# Patient Record
Sex: Female | Born: 1950
Health system: Southern US, Community
[De-identification: ages and names within clinical notes are randomized; demographics above are authoritative.]

## PROBLEM LIST (undated history)

## (undated) DIAGNOSIS — K635 Polyp of colon: Secondary | ICD-10-CM

## (undated) DIAGNOSIS — R51 Headache: Secondary | ICD-10-CM

## (undated) DIAGNOSIS — L438 Other lichen planus: Secondary | ICD-10-CM

## (undated) DIAGNOSIS — Z9289 Personal history of other medical treatment: Secondary | ICD-10-CM

## (undated) DIAGNOSIS — D571 Sickle-cell disease without crisis: Secondary | ICD-10-CM

## (undated) DIAGNOSIS — K759 Inflammatory liver disease, unspecified: Secondary | ICD-10-CM

## (undated) DIAGNOSIS — B1920 Unspecified viral hepatitis C without hepatic coma: Secondary | ICD-10-CM

## (undated) DIAGNOSIS — R296 Repeated falls: Secondary | ICD-10-CM

## (undated) DIAGNOSIS — E559 Vitamin D deficiency, unspecified: Secondary | ICD-10-CM

## (undated) DIAGNOSIS — M810 Age-related osteoporosis without current pathological fracture: Secondary | ICD-10-CM

## (undated) DIAGNOSIS — T7840XA Allergy, unspecified, initial encounter: Secondary | ICD-10-CM

## (undated) DIAGNOSIS — S0990XA Unspecified injury of head, initial encounter: Secondary | ICD-10-CM

## (undated) DIAGNOSIS — G44329 Chronic post-traumatic headache, not intractable: Secondary | ICD-10-CM

## (undated) DIAGNOSIS — H269 Unspecified cataract: Secondary | ICD-10-CM

## (undated) DIAGNOSIS — F329 Major depressive disorder, single episode, unspecified: Secondary | ICD-10-CM

## (undated) DIAGNOSIS — T4145XA Adverse effect of unspecified anesthetic, initial encounter: Secondary | ICD-10-CM

## (undated) DIAGNOSIS — K219 Gastro-esophageal reflux disease without esophagitis: Secondary | ICD-10-CM

## (undated) DIAGNOSIS — J45909 Unspecified asthma, uncomplicated: Secondary | ICD-10-CM

## (undated) DIAGNOSIS — Z8489 Family history of other specified conditions: Secondary | ICD-10-CM

## (undated) DIAGNOSIS — R569 Unspecified convulsions: Secondary | ICD-10-CM

## (undated) DIAGNOSIS — F32A Depression, unspecified: Secondary | ICD-10-CM

## (undated) DIAGNOSIS — R519 Headache, unspecified: Secondary | ICD-10-CM

## (undated) DIAGNOSIS — K921 Melena: Secondary | ICD-10-CM

## (undated) DIAGNOSIS — K859 Acute pancreatitis without necrosis or infection, unspecified: Secondary | ICD-10-CM

## (undated) DIAGNOSIS — M1990 Unspecified osteoarthritis, unspecified site: Secondary | ICD-10-CM

## (undated) DIAGNOSIS — F419 Anxiety disorder, unspecified: Secondary | ICD-10-CM

## (undated) DIAGNOSIS — I10 Essential (primary) hypertension: Secondary | ICD-10-CM

## (undated) HISTORY — DX: Age-related osteoporosis without current pathological fracture: M81.0

## (undated) HISTORY — PX: COLONOSCOPY: SHX174

## (undated) HISTORY — DX: Unspecified viral hepatitis C without hepatic coma: B19.20

## (undated) HISTORY — DX: Headache, unspecified: R51.9

## (undated) HISTORY — DX: Other lichen planus: L43.8

## (undated) HISTORY — DX: Polyp of colon: K63.5

## (undated) HISTORY — PX: HERNIA REPAIR: SHX51

## (undated) HISTORY — DX: Depression, unspecified: F32.A

## (undated) HISTORY — DX: Chronic post-traumatic headache, not intractable: G44.329

## (undated) HISTORY — PX: COLON RESECTION: SHX5231

## (undated) HISTORY — PX: OTHER SURGICAL HISTORY: SHX169

## (undated) HISTORY — DX: Unspecified cataract: H26.9

## (undated) HISTORY — DX: Major depressive disorder, single episode, unspecified: F32.9

## (undated) HISTORY — DX: Allergy, unspecified, initial encounter: T78.40XA

## (undated) HISTORY — DX: Melena: K92.1

## (undated) HISTORY — DX: Gastro-esophageal reflux disease without esophagitis: K21.9

## (undated) HISTORY — DX: Vitamin D deficiency, unspecified: E55.9

## (undated) HISTORY — DX: Headache: R51

## (undated) HISTORY — DX: Sickle-cell disease without crisis: D57.1

## (undated) HISTORY — PX: APPENDECTOMY: SHX54

## (undated) HISTORY — DX: Acute pancreatitis without necrosis or infection, unspecified: K85.90

## (undated) HISTORY — PX: EXPLORATORY LAPAROTOMY: SUR591

## (undated) HISTORY — PX: BREAST BIOPSY: SHX20

## (undated) HISTORY — PX: SPINE SURGERY: SHX786

## (undated) HISTORY — DX: Anxiety disorder, unspecified: F41.9

## (undated) HISTORY — PX: TONSILLECTOMY: SUR1361

---

## 1977-03-03 HISTORY — PX: ABDOMINAL HYSTERECTOMY: SHX81

## 2009-04-14 ENCOUNTER — Ambulatory Visit: Payer: Self-pay | Admitting: Neurology

## 2009-07-13 ENCOUNTER — Emergency Department: Payer: Self-pay | Admitting: Emergency Medicine

## 2010-09-12 LAB — HM DEXA SCAN

## 2011-07-15 ENCOUNTER — Ambulatory Visit: Payer: Self-pay | Admitting: Internal Medicine

## 2011-11-26 ENCOUNTER — Ambulatory Visit: Payer: Self-pay | Admitting: Otolaryngology

## 2011-12-15 LAB — PULMONARY FUNCTION TEST

## 2011-12-18 ENCOUNTER — Ambulatory Visit: Payer: Self-pay | Admitting: Internal Medicine

## 2013-09-29 ENCOUNTER — Ambulatory Visit: Payer: Self-pay | Admitting: Gastroenterology

## 2013-10-21 ENCOUNTER — Ambulatory Visit: Payer: Self-pay | Admitting: Gastroenterology

## 2014-10-17 ENCOUNTER — Other Ambulatory Visit: Payer: Self-pay | Admitting: Internal Medicine

## 2014-10-20 ENCOUNTER — Other Ambulatory Visit: Payer: Self-pay | Admitting: Internal Medicine

## 2014-10-20 DIAGNOSIS — N6489 Other specified disorders of breast: Secondary | ICD-10-CM

## 2014-10-30 ENCOUNTER — Ambulatory Visit
Admission: RE | Admit: 2014-10-30 | Discharge: 2014-10-30 | Disposition: A | Discharge status: Home / Self Care | Payer: Medicare Other | Source: Ambulatory Visit | Attending: Internal Medicine | Admitting: Internal Medicine

## 2014-10-30 ENCOUNTER — Other Ambulatory Visit: Payer: Self-pay | Admitting: Internal Medicine

## 2014-10-30 DIAGNOSIS — N6489 Other specified disorders of breast: Secondary | ICD-10-CM

## 2015-04-12 LAB — HM PAP SMEAR: HM PAP: NEGATIVE

## 2015-06-13 ENCOUNTER — Other Ambulatory Visit: Payer: Self-pay | Admitting: Internal Medicine

## 2015-06-13 DIAGNOSIS — Z1231 Encounter for screening mammogram for malignant neoplasm of breast: Secondary | ICD-10-CM

## 2015-08-31 ENCOUNTER — Ambulatory Visit: Payer: Medicare Other

## 2016-03-03 DIAGNOSIS — T8859XA Other complications of anesthesia, initial encounter: Secondary | ICD-10-CM

## 2016-03-03 HISTORY — DX: Other complications of anesthesia, initial encounter: T88.59XA

## 2016-03-18 DIAGNOSIS — J4520 Mild intermittent asthma, uncomplicated: Secondary | ICD-10-CM | POA: Diagnosis not present

## 2016-03-18 DIAGNOSIS — E782 Mixed hyperlipidemia: Secondary | ICD-10-CM | POA: Diagnosis not present

## 2016-03-18 DIAGNOSIS — I10 Essential (primary) hypertension: Secondary | ICD-10-CM | POA: Diagnosis not present

## 2016-03-18 DIAGNOSIS — G44329 Chronic post-traumatic headache, not intractable: Secondary | ICD-10-CM | POA: Diagnosis not present

## 2016-04-18 DIAGNOSIS — I10 Essential (primary) hypertension: Secondary | ICD-10-CM | POA: Diagnosis not present

## 2016-04-18 DIAGNOSIS — M545 Low back pain: Secondary | ICD-10-CM | POA: Diagnosis not present

## 2016-04-18 DIAGNOSIS — R42 Dizziness and giddiness: Secondary | ICD-10-CM | POA: Diagnosis not present

## 2016-04-21 DIAGNOSIS — M5417 Radiculopathy, lumbosacral region: Secondary | ICD-10-CM | POA: Diagnosis not present

## 2016-04-22 ENCOUNTER — Ambulatory Visit: Payer: Medicare Other | Attending: Internal Medicine

## 2016-04-23 DIAGNOSIS — M4686 Other specified inflammatory spondylopathies, lumbar region: Secondary | ICD-10-CM | POA: Diagnosis not present

## 2016-04-23 DIAGNOSIS — M5136 Other intervertebral disc degeneration, lumbar region: Secondary | ICD-10-CM | POA: Diagnosis not present

## 2016-04-23 DIAGNOSIS — M4807 Spinal stenosis, lumbosacral region: Secondary | ICD-10-CM | POA: Diagnosis not present

## 2016-04-23 DIAGNOSIS — M47817 Spondylosis without myelopathy or radiculopathy, lumbosacral region: Secondary | ICD-10-CM | POA: Diagnosis not present

## 2016-04-23 DIAGNOSIS — M48061 Spinal stenosis, lumbar region without neurogenic claudication: Secondary | ICD-10-CM | POA: Diagnosis not present

## 2016-04-24 DIAGNOSIS — F411 Generalized anxiety disorder: Secondary | ICD-10-CM | POA: Diagnosis not present

## 2016-04-24 DIAGNOSIS — I10 Essential (primary) hypertension: Secondary | ICD-10-CM | POA: Diagnosis not present

## 2016-04-24 DIAGNOSIS — M4807 Spinal stenosis, lumbosacral region: Secondary | ICD-10-CM | POA: Diagnosis not present

## 2016-05-05 DIAGNOSIS — M5416 Radiculopathy, lumbar region: Secondary | ICD-10-CM | POA: Diagnosis not present

## 2016-05-08 DIAGNOSIS — M5416 Radiculopathy, lumbar region: Secondary | ICD-10-CM | POA: Diagnosis not present

## 2016-05-08 DIAGNOSIS — M545 Low back pain: Secondary | ICD-10-CM | POA: Diagnosis not present

## 2016-05-08 DIAGNOSIS — M4807 Spinal stenosis, lumbosacral region: Secondary | ICD-10-CM | POA: Diagnosis not present

## 2016-05-14 DIAGNOSIS — M5416 Radiculopathy, lumbar region: Secondary | ICD-10-CM | POA: Diagnosis not present

## 2016-05-14 DIAGNOSIS — M4807 Spinal stenosis, lumbosacral region: Secondary | ICD-10-CM | POA: Diagnosis not present

## 2016-05-14 DIAGNOSIS — M545 Low back pain: Secondary | ICD-10-CM | POA: Diagnosis not present

## 2016-05-15 DIAGNOSIS — M4807 Spinal stenosis, lumbosacral region: Secondary | ICD-10-CM | POA: Diagnosis not present

## 2016-05-15 DIAGNOSIS — M5416 Radiculopathy, lumbar region: Secondary | ICD-10-CM | POA: Diagnosis not present

## 2016-05-15 DIAGNOSIS — M545 Low back pain: Secondary | ICD-10-CM | POA: Diagnosis not present

## 2016-05-20 DIAGNOSIS — M545 Low back pain: Secondary | ICD-10-CM | POA: Diagnosis not present

## 2016-05-20 DIAGNOSIS — M4807 Spinal stenosis, lumbosacral region: Secondary | ICD-10-CM | POA: Diagnosis not present

## 2016-05-20 DIAGNOSIS — M5416 Radiculopathy, lumbar region: Secondary | ICD-10-CM | POA: Diagnosis not present

## 2016-05-27 DIAGNOSIS — M4807 Spinal stenosis, lumbosacral region: Secondary | ICD-10-CM | POA: Diagnosis not present

## 2016-05-27 DIAGNOSIS — M545 Low back pain: Secondary | ICD-10-CM | POA: Diagnosis not present

## 2016-05-27 DIAGNOSIS — M5416 Radiculopathy, lumbar region: Secondary | ICD-10-CM | POA: Diagnosis not present

## 2016-05-29 DIAGNOSIS — M5416 Radiculopathy, lumbar region: Secondary | ICD-10-CM | POA: Diagnosis not present

## 2016-05-29 DIAGNOSIS — M545 Low back pain: Secondary | ICD-10-CM | POA: Diagnosis not present

## 2016-05-29 DIAGNOSIS — M4807 Spinal stenosis, lumbosacral region: Secondary | ICD-10-CM | POA: Diagnosis not present

## 2016-06-10 DIAGNOSIS — M4807 Spinal stenosis, lumbosacral region: Secondary | ICD-10-CM | POA: Diagnosis not present

## 2016-06-11 DIAGNOSIS — M48062 Spinal stenosis, lumbar region with neurogenic claudication: Secondary | ICD-10-CM | POA: Diagnosis not present

## 2016-06-19 DIAGNOSIS — M48061 Spinal stenosis, lumbar region without neurogenic claudication: Secondary | ICD-10-CM | POA: Diagnosis not present

## 2016-06-19 DIAGNOSIS — M48062 Spinal stenosis, lumbar region with neurogenic claudication: Secondary | ICD-10-CM | POA: Diagnosis not present

## 2016-07-04 DIAGNOSIS — M48062 Spinal stenosis, lumbar region with neurogenic claudication: Secondary | ICD-10-CM | POA: Diagnosis not present

## 2016-07-15 ENCOUNTER — Other Ambulatory Visit: Payer: Self-pay | Admitting: Orthopedic Surgery

## 2016-07-15 DIAGNOSIS — M5412 Radiculopathy, cervical region: Secondary | ICD-10-CM

## 2016-07-20 ENCOUNTER — Ambulatory Visit
Admission: RE | Admit: 2016-07-20 | Discharge: 2016-07-20 | Disposition: A | Discharge status: Home / Self Care | Payer: Medicare Other | Source: Ambulatory Visit | Attending: Orthopedic Surgery | Admitting: Orthopedic Surgery

## 2016-07-20 DIAGNOSIS — M4802 Spinal stenosis, cervical region: Secondary | ICD-10-CM | POA: Diagnosis not present

## 2016-07-20 DIAGNOSIS — M5412 Radiculopathy, cervical region: Secondary | ICD-10-CM

## 2016-07-22 DIAGNOSIS — G959 Disease of spinal cord, unspecified: Secondary | ICD-10-CM | POA: Diagnosis not present

## 2016-07-25 ENCOUNTER — Other Ambulatory Visit: Payer: Self-pay | Admitting: Orthopedic Surgery

## 2016-08-01 ENCOUNTER — Ambulatory Visit (HOSPITAL_COMMUNITY)
Admission: RE | Admit: 2016-08-01 | Discharge: 2016-08-01 | Disposition: A | Discharge status: Home / Self Care | Payer: Medicare Other | Source: Ambulatory Visit | Attending: Orthopedic Surgery | Admitting: Orthopedic Surgery

## 2016-08-01 ENCOUNTER — Encounter (HOSPITAL_COMMUNITY)
Admission: RE | Admit: 2016-08-01 | Discharge: 2016-08-01 | Disposition: A | Discharge status: Home / Self Care | Payer: Medicare Other | Source: Ambulatory Visit | Attending: Orthopedic Surgery | Admitting: Orthopedic Surgery

## 2016-08-01 ENCOUNTER — Encounter (HOSPITAL_COMMUNITY): Payer: Self-pay

## 2016-08-01 DIAGNOSIS — Z01812 Encounter for preprocedural laboratory examination: Secondary | ICD-10-CM | POA: Insufficient documentation

## 2016-08-01 DIAGNOSIS — Z01818 Encounter for other preprocedural examination: Secondary | ICD-10-CM | POA: Diagnosis not present

## 2016-08-01 DIAGNOSIS — Z79899 Other long term (current) drug therapy: Secondary | ICD-10-CM | POA: Diagnosis not present

## 2016-08-01 DIAGNOSIS — Z87891 Personal history of nicotine dependence: Secondary | ICD-10-CM | POA: Diagnosis not present

## 2016-08-01 DIAGNOSIS — G959 Disease of spinal cord, unspecified: Secondary | ICD-10-CM | POA: Insufficient documentation

## 2016-08-01 DIAGNOSIS — R569 Unspecified convulsions: Secondary | ICD-10-CM | POA: Insufficient documentation

## 2016-08-01 HISTORY — DX: Personal history of other medical treatment: Z92.89

## 2016-08-01 HISTORY — DX: Gastro-esophageal reflux disease without esophagitis: K21.9

## 2016-08-01 HISTORY — DX: Unspecified asthma, uncomplicated: J45.909

## 2016-08-01 HISTORY — DX: Unspecified injury of head, initial encounter: S09.90XA

## 2016-08-01 HISTORY — DX: Family history of other specified conditions: Z84.89

## 2016-08-01 HISTORY — DX: Essential (primary) hypertension: I10

## 2016-08-01 HISTORY — DX: Unspecified osteoarthritis, unspecified site: M19.90

## 2016-08-01 HISTORY — DX: Unspecified convulsions: R56.9

## 2016-08-01 LAB — CBC WITH DIFFERENTIAL/PLATELET
Basophils Absolute: 0 10*3/uL (ref 0.0–0.1)
Basophils Relative: 1 %
EOS PCT: 5 %
Eosinophils Absolute: 0.3 10*3/uL (ref 0.0–0.7)
HEMATOCRIT: 37.1 % (ref 36.0–46.0)
HEMOGLOBIN: 12.2 g/dL (ref 12.0–15.0)
LYMPHS ABS: 1.9 10*3/uL (ref 0.7–4.0)
LYMPHS PCT: 32 %
MCH: 28.8 pg (ref 26.0–34.0)
MCHC: 32.9 g/dL (ref 30.0–36.0)
MCV: 87.5 fL (ref 78.0–100.0)
Monocytes Absolute: 0.5 10*3/uL (ref 0.1–1.0)
Monocytes Relative: 8 %
NEUTROS ABS: 3.1 10*3/uL (ref 1.7–7.7)
NEUTROS PCT: 54 %
Platelets: 186 10*3/uL (ref 150–400)
RBC: 4.24 MIL/uL (ref 3.87–5.11)
RDW: 12.3 % (ref 11.5–15.5)
WBC: 5.7 10*3/uL (ref 4.0–10.5)

## 2016-08-01 LAB — COMPREHENSIVE METABOLIC PANEL
ALT: 39 U/L (ref 14–54)
AST: 45 U/L — ABNORMAL HIGH (ref 15–41)
Albumin: 3.7 g/dL (ref 3.5–5.0)
Alkaline Phosphatase: 78 U/L (ref 38–126)
Anion gap: 7 (ref 5–15)
BILIRUBIN TOTAL: 0.9 mg/dL (ref 0.3–1.2)
BUN: 8 mg/dL (ref 6–20)
CALCIUM: 8.9 mg/dL (ref 8.9–10.3)
CO2: 28 mmol/L (ref 22–32)
CREATININE: 0.87 mg/dL (ref 0.44–1.00)
Chloride: 106 mmol/L (ref 101–111)
Glucose, Bld: 76 mg/dL (ref 65–99)
Potassium: 4.4 mmol/L (ref 3.5–5.1)
Sodium: 141 mmol/L (ref 135–145)
Total Protein: 6.4 g/dL — ABNORMAL LOW (ref 6.5–8.1)

## 2016-08-01 LAB — PROTIME-INR
INR: 1.01
PROTHROMBIN TIME: 13.3 s (ref 11.4–15.2)

## 2016-08-01 LAB — URINALYSIS, ROUTINE W REFLEX MICROSCOPIC
Bilirubin Urine: NEGATIVE
GLUCOSE, UA: NEGATIVE mg/dL
Hgb urine dipstick: NEGATIVE
KETONES UR: NEGATIVE mg/dL
Leukocytes, UA: NEGATIVE
Nitrite: NEGATIVE
PH: 7 (ref 5.0–8.0)
PROTEIN: NEGATIVE mg/dL
Specific Gravity, Urine: 1.009 (ref 1.005–1.030)

## 2016-08-01 LAB — SURGICAL PCR SCREEN
MRSA, PCR: NEGATIVE
Staphylococcus aureus: NEGATIVE

## 2016-08-01 LAB — APTT: aPTT: 33 seconds (ref 24–36)

## 2016-08-01 NOTE — Pre-Procedure Instructions (Signed)
    Amy Grant  08/01/2016    Your procedure is scheduled on Thursday, June 7.  [facility]:00 AM - per Dr Marshell Levanumonski's instructions               Your surgery or procedure is scheduled for 0:00 AM   Call this number if you have problems the morning of [phone]                For any other questions, please call [phone], Monday - Friday 8 AM - 4 PM.     Remember:  Do not eat food or drink liquids after midnight Wednesday, June 6.  Take these medicines the morning of surgery with A SIP OF WATER:gabapentin (NEURONTIN),  lamoTRIgine (LAMICTAL), pantoprazole (PROTONIX).  May use Inhalers and Bring it to the hospital with you.                    May take if needed and you tolerate without food: oxyCODONE-acetaminophen (PERCOCET/ROXICET), cyclobenzaprine (FLEXERIL).                  1 Week prior to surgery STOP taking Aspirin, Aspirin Products (Goody Powder, Excedrin Migraine), Ibuprofen (Advil), Naproxen (Aleve), meloxicam (MOBIC) Vitamins and Herbal Products (ie Fish Oil).   Do not wear jewelry, make-up or nail polish.  Do not wear lotions, powders, or perfumes, or deodorant.  Do not shave 48 hours prior to surgery.  Men may shave face and neck.  Do not bring valuables to the hospital.  [facility] not [facility] any belongings or valuables.  Contacts, dentures or bridgework may not be worn into surgery.  Leave your suitcase in the car.  After surgery it may be brought to your room.  For patients admitted to the hospital, discharge time will be determined by your treatment team.  Special instructions: Review  [facility] - Preparing For Surgery.  Please read over the following fact sheets that you were given. [facility]- Preparing For Surgery and Patient Instructions for Mupirocin Application, Pain Booklet, Surgical Site Infections

## 2016-08-01 NOTE — Progress Notes (Signed)
Ms Amy Grant denies chest pain or shortness of breath.  Patient was seen by Dr Santo HeldS.Kahn the cardiologist recently to rule out cardiac issues causing weakness that she has been experiencing.  Cardiac issues were ruled out.  Patients PCP is Dr Welton FlakesKhan. I requestted records by both Dr Milta DeitersKhan's offices.

## 2016-08-04 ENCOUNTER — Encounter (HOSPITAL_COMMUNITY): Payer: Self-pay

## 2016-08-04 ENCOUNTER — Other Ambulatory Visit (HOSPITAL_COMMUNITY): Payer: Medicare Other

## 2016-08-04 NOTE — Progress Notes (Signed)
Anesthesia Chart Review:  Pt is a 66 year old female scheduled for C4-5 ACDF on 08/07/2016 with Estill BambergMark Dumonski, MD  - No PCP listed. - Saw cardiologist Adrian BlackwaterShaukat Khan, MD for chest pain 11/2015. Echo and stress test ordered, results below. F/u prn recommended.   PMH includes:  Seizures. Former smoker. BMI 22.5  Medications include: Lamictal, Protonix, albuterol, verapamil  Preoperative labs reviewed.    CXR 61/18: No active cardiopulmonary disease.   EKG 11/16/15: NSR.  Nuclear stress test 12/11/15 (Dr. Milta DeitersKhan's office): Normal stress test with normal LVEF 67%.  Echo 12/03/15 (Dr. Milta DeitersKhan's office): 1. Suboptimal apical views due to poor windows. 2. Mildly dilated LA with LV, RA and RV, and aorta appearing normal in size. 3. Normal LV systolic function with grade I diastolic dysfunction 4. Normal wall motion. 5. Mild pulmonary regurgitation (2 jets) 6. Mild tricuspid regurgitation. 7. Normal pulmonary artery pressure. 8. Mild to moderate mitral regurgitation.  If no changes, I anticipate pt can proceed with surgery as scheduled.   Rica Mastngela Rontavious Albright, FNP-BC [facility]/Anesthesiology Phone: [phone] 08/04/2016 2:44 PM

## 2016-08-06 NOTE — H&P (Signed)
PREOPERATIVE H&P  Chief Complaint: Balance instability  HPI: Amy Grant is a 66 y.o. female who presents with progressive balance difficulties  MRI reveals SCC at C4/5  Patient has failed multiple forms of conservative care and continues to have pain (see office notes for additional details regarding the patient's full course of treatment)  Past Medical History:  Diagnosis Date  . Arthritis    right hand, DDD L 4 , L 5  . Asthma   . Family history of adverse reaction to anesthesia    Sister woke up during surgery  . GERD (gastroesophageal reflux disease)   . Head injury   . History of blood transfusion   . Hypertension   . Seizures (HCC)     after head injury   Past Surgical History:  Procedure Laterality Date  . ABDOMINAL HYSTERECTOMY      a ovarary left  . APPENDECTOMY    . BREAST BIOPSY Left    over 20 years ago negative  . CESAREAN SECTION    . COLON RESECTION    . COLONOSCOPY    . EXPLORATORY LAPAROTOMY     after C Section  x 2 - 1 for bleeding and 1 for infection  . HERNIA REPAIR     Inguinal - as a child  . TONSILLECTOMY     Social History   Social History  . Marital status: Divorced    Spouse name: N/A  . Number of children: N/A  . Years of education: N/A   Social History Main Topics  . Smoking status: Former Smoker    Years: 30.00  . Smokeless tobacco: Never Used  . Alcohol use Yes     Comment: ocassional glass of wine  . Drug use: No  . Sexual activity: Not on file   Other Topics Concern  . Not on file   Social History Narrative  . No narrative on file   Family History  Problem Relation Age of Onset  . Breast cancer Sister 8065   Allergies  Allergen Reactions  . Linaclotide Itching  . Latex Hives  . Sulfa Antibiotics Hives   Prior to Admission medications   Medication Sig Start Date End Date Taking? Authorizing Provider  Calcium Carbonate-Vitamin D (CALTRATE 600+D PO) Take 2 tablets by mouth daily.   Yes [provider]  cyclobenzaprine (FLEXERIL) 10 MG tablet Take 10 mg by mouth daily as needed for muscle spasms.   Yes [provider]  gabapentin (NEURONTIN) 300 MG capsule Take 300 mg by mouth 2 (two) times daily.   Yes [provider]  lamoTRIgine (LAMICTAL) 100 MG tablet Take 100 mg by mouth daily.   Yes [provider]  meloxicam (MOBIC) 15 MG tablet Take 15 mg by mouth every evening.   Yes [provider]  Multiple Vitamin (MULTIVITAMIN WITH MINERALS) TABS tablet Take 1 tablet by mouth daily.   Yes [provider]  Multiple Vitamins-Minerals (HAIR/SKIN/NAILS) TABS Take 1 tablet by mouth daily.   Yes [provider]  Omega-3 Fatty Acids (OMEGA 3 PO) Take 1 capsule by mouth 2 (two) times daily. 1470 mg Fish Oil   Yes [provider]  oxyCODONE-acetaminophen (PERCOCET/ROXICET) 5-325 MG tablet Take 1 tablet by mouth 2 (two) times daily as needed for moderate pain or severe pain.   Yes [provider]  pantoprazole (PROTONIX) 40 MG tablet Take 40 mg by mouth 2 (two) times daily.   Yes [provider]  PREVIDENT  5000 SENSITIVE 1.1-5 % PSTE Take 1 application by mouth 2 (two) times daily. 06/30/16  Yes [provider]  UNABLE TO FIND Take 1 tablet by mouth daily. Melaleuca Unforgettables Memory supplement Phosphatidylserine Omega # Ginkgo Biloba Antioxidants   Yes [provider]  UNABLE TO FIND Take 1 tablet by mouth 2 (two) times daily. Melaleuca Estraval Estrogen Supplement   Yes [provider]  UNABLE TO FIND Take 1 capsule by mouth daily. Melaleuca Replenex Bone Health   Yes [provider]  UNABLE TO FIND Take 1 tablet by mouth daily. Melaleuca Acuity [place] : Memory Supplement 1000 mcg B12 200 mg Vitamin E  800 mcg Folic Acid   Yes [provider]  VENTOLIN HFA 108 (90 Base) MCG/ACT inhaler Inhale 2 puffs into the lungs every 4 (four) hours as needed for shortness of  breath (takes 2 times daily every day).  07/01/16  Yes [provider]  verapamil (CALAN-SR) 240 MG CR tablet Take 240 mg by mouth at bedtime.    Yes [provider]     All other systems have been reviewed and were otherwise negative with the exception of those mentioned in the HPI and as above.  Physical Exam: There were no vitals filed for this visit.  General: Alert, no acute distress Cardiovascular: No pedal edema Respiratory: No cyanosis, no use of accessory musculature Skin: No lesions in the area of chief complaint Neurologic: Sensation intact distally Psychiatric: Patient is competent for consent with normal mood and affect Lymphatic: No axillary or cervical lymphadenopathy  MUSCULOSKELETAL: + hoffman's sign bilaterally  Assessment/Plan: Cervical myelopathy  Plan for Procedure(s): ANTERIOR CERVICAL DECOMPRESSION FUSION, CERVICAL 4-5 WITH INSTRUMENTATION AND ALLOGRAFT   Emilee Hero, MD 08/06/2016 2:18 PM

## 2016-08-07 ENCOUNTER — Encounter (HOSPITAL_COMMUNITY)
Admission: RE | Disposition: A | Discharge status: Home / Self Care | Payer: Self-pay | Source: Ambulatory Visit | Attending: Orthopedic Surgery

## 2016-08-07 ENCOUNTER — Ambulatory Visit (HOSPITAL_COMMUNITY): Payer: Medicare Other | Admitting: Emergency Medicine

## 2016-08-07 ENCOUNTER — Inpatient Hospital Stay (HOSPITAL_COMMUNITY)
Admission: RE | Admit: 2016-08-07 | Discharge: 2016-08-08 | DRG: 030 | Disposition: A | Discharge status: Home / Self Care | Payer: Medicare Other | Source: Ambulatory Visit | Attending: Orthopedic Surgery | Admitting: Orthopedic Surgery

## 2016-08-07 ENCOUNTER — Encounter (HOSPITAL_COMMUNITY): Payer: Self-pay | Admitting: General Practice

## 2016-08-07 ENCOUNTER — Ambulatory Visit (HOSPITAL_COMMUNITY): Payer: Medicare Other | Admitting: Certified Registered"

## 2016-08-07 ENCOUNTER — Ambulatory Visit (HOSPITAL_COMMUNITY): Payer: Medicare Other

## 2016-08-07 DIAGNOSIS — M40209 Unspecified kyphosis, site unspecified: Secondary | ICD-10-CM | POA: Diagnosis present

## 2016-08-07 DIAGNOSIS — Z882 Allergy status to sulfonamides status: Secondary | ICD-10-CM

## 2016-08-07 DIAGNOSIS — Z888 Allergy status to other drugs, medicaments and biological substances status: Secondary | ICD-10-CM | POA: Diagnosis not present

## 2016-08-07 DIAGNOSIS — G40909 Epilepsy, unspecified, not intractable, without status epilepticus: Secondary | ICD-10-CM | POA: Diagnosis present

## 2016-08-07 DIAGNOSIS — G9589 Other specified diseases of spinal cord: Principal | ICD-10-CM | POA: Diagnosis present

## 2016-08-07 DIAGNOSIS — M50221 Other cervical disc displacement at C4-C5 level: Secondary | ICD-10-CM | POA: Diagnosis present

## 2016-08-07 DIAGNOSIS — K219 Gastro-esophageal reflux disease without esophagitis: Secondary | ICD-10-CM | POA: Diagnosis present

## 2016-08-07 DIAGNOSIS — G959 Disease of spinal cord, unspecified: Secondary | ICD-10-CM | POA: Diagnosis not present

## 2016-08-07 DIAGNOSIS — Z91040 Latex allergy status: Secondary | ICD-10-CM

## 2016-08-07 DIAGNOSIS — M5410 Radiculopathy, site unspecified: Secondary | ICD-10-CM | POA: Diagnosis present

## 2016-08-07 DIAGNOSIS — M5416 Radiculopathy, lumbar region: Secondary | ICD-10-CM | POA: Diagnosis present

## 2016-08-07 DIAGNOSIS — Z87891 Personal history of nicotine dependence: Secondary | ICD-10-CM | POA: Diagnosis not present

## 2016-08-07 DIAGNOSIS — Z79899 Other long term (current) drug therapy: Secondary | ICD-10-CM

## 2016-08-07 DIAGNOSIS — Z419 Encounter for procedure for purposes other than remedying health state, unspecified: Secondary | ICD-10-CM

## 2016-08-07 DIAGNOSIS — R296 Repeated falls: Secondary | ICD-10-CM | POA: Diagnosis present

## 2016-08-07 DIAGNOSIS — M40292 Other kyphosis, cervical region: Secondary | ICD-10-CM | POA: Diagnosis not present

## 2016-08-07 DIAGNOSIS — I10 Essential (primary) hypertension: Secondary | ICD-10-CM | POA: Diagnosis present

## 2016-08-07 DIAGNOSIS — M4322 Fusion of spine, cervical region: Secondary | ICD-10-CM | POA: Diagnosis not present

## 2016-08-07 DIAGNOSIS — G8929 Other chronic pain: Secondary | ICD-10-CM | POA: Diagnosis present

## 2016-08-07 DIAGNOSIS — Z9889 Other specified postprocedural states: Secondary | ICD-10-CM | POA: Diagnosis not present

## 2016-08-07 HISTORY — DX: Repeated falls: R29.6

## 2016-08-07 HISTORY — PX: ANTERIOR CERVICAL DECOMP/DISCECTOMY FUSION: SHX1161

## 2016-08-07 HISTORY — DX: Other chronic pain: G89.29

## 2016-08-07 SURGERY — ANTERIOR CERVICAL DECOMPRESSION/DISCECTOMY FUSION 1 LEVEL
Anesthesia: General | Site: Neck

## 2016-08-07 MED ORDER — CALCIUM CARBONATE-VITAMIN D 500-200 MG-UNIT PO TABS
ORAL_TABLET | Freq: Every day | ORAL | Status: DC
  Administered 2016-08-08: 1 via ORAL
  Filled 2016-08-07: qty 1

## 2016-08-07 MED ORDER — THROMBIN 20000 UNITS EX SOLR
CUTANEOUS | Status: AC
  Filled 2016-08-07: qty 20000

## 2016-08-07 MED ORDER — FENTANYL CITRATE (PF) 250 MCG/5ML IJ SOLN
INTRAMUSCULAR | Status: AC
  Filled 2016-08-07: qty 5

## 2016-08-07 MED ORDER — VERAPAMIL HCL ER 240 MG PO TBCR
240.0000 mg | EXTENDED_RELEASE_TABLET | Freq: Every day | ORAL | Status: DC
  Administered 2016-08-07: 240 mg via ORAL
  Filled 2016-08-07: qty 1

## 2016-08-07 MED ORDER — DEXAMETHASONE SODIUM PHOSPHATE 10 MG/ML IJ SOLN
INTRAMUSCULAR | Status: AC
  Filled 2016-08-07: qty 1

## 2016-08-07 MED ORDER — BUPIVACAINE-EPINEPHRINE 0.25% -1:200000 IJ SOLN
INTRAMUSCULAR | Status: DC | PRN
  Administered 2016-08-07: 3 mL

## 2016-08-07 MED ORDER — CEFAZOLIN SODIUM-DEXTROSE 2-4 GM/100ML-% IV SOLN
2.0000 g | Freq: Three times a day (TID) | INTRAVENOUS | Status: AC
  Administered 2016-08-07 – 2016-08-08 (×2): 2 g via INTRAVENOUS
  Filled 2016-08-07 (×2): qty 100

## 2016-08-07 MED ORDER — ADULT MULTIVITAMIN W/MINERALS CH
1.0000 | ORAL_TABLET | Freq: Every day | ORAL | Status: DC
  Administered 2016-08-08: 1 via ORAL
  Filled 2016-08-07: qty 1

## 2016-08-07 MED ORDER — DOCUSATE SODIUM 100 MG PO CAPS
100.0000 mg | ORAL_CAPSULE | Freq: Two times a day (BID) | ORAL | Status: DC
  Administered 2016-08-07 – 2016-08-08 (×2): 100 mg via ORAL
  Filled 2016-08-07 (×2): qty 1

## 2016-08-07 MED ORDER — EPINEPHRINE PF 1 MG/ML IJ SOLN
INTRAMUSCULAR | Status: AC
  Filled 2016-08-07: qty 1

## 2016-08-07 MED ORDER — SENNOSIDES-DOCUSATE SODIUM 8.6-50 MG PO TABS: 1.0000 | ORAL_TABLET | Freq: Every evening | ORAL | Status: DC | PRN

## 2016-08-07 MED ORDER — OXYCODONE HCL 5 MG PO TABS: 5.0000 mg | ORAL_TABLET | Freq: Once | ORAL | Status: DC | PRN

## 2016-08-07 MED ORDER — ROCURONIUM BROMIDE 10 MG/ML (PF) SYRINGE
PREFILLED_SYRINGE | INTRAVENOUS | Status: AC
  Filled 2016-08-07: qty 5

## 2016-08-07 MED ORDER — ACETAMINOPHEN 325 MG PO TABS: 650.0000 mg | ORAL_TABLET | ORAL | Status: DC | PRN

## 2016-08-07 MED ORDER — PHENYLEPHRINE HCL 10 MG/ML IJ SOLN
INTRAVENOUS | Status: DC | PRN
  Administered 2016-08-07: 30 ug/min via INTRAVENOUS

## 2016-08-07 MED ORDER — 0.9 % SODIUM CHLORIDE (POUR BTL) OPTIME
TOPICAL | Status: DC | PRN
  Administered 2016-08-07: 1000 mL

## 2016-08-07 MED ORDER — ACETAMINOPHEN 650 MG RE SUPP: 650.0000 mg | RECTAL | Status: DC | PRN

## 2016-08-07 MED ORDER — SUGAMMADEX SODIUM 200 MG/2ML IV SOLN
INTRAVENOUS | Status: DC | PRN
  Administered 2016-08-07: 130 mg via INTRAVENOUS

## 2016-08-07 MED ORDER — POVIDONE-IODINE 7.5 % EX SOLN
Freq: Once | CUTANEOUS | Status: DC
  Filled 2016-08-07: qty 118

## 2016-08-07 MED ORDER — BUPIVACAINE HCL (PF) 0.25 % IJ SOLN
INTRAMUSCULAR | Status: AC
  Filled 2016-08-07: qty 30

## 2016-08-07 MED ORDER — MIDAZOLAM HCL 5 MG/5ML IJ SOLN
INTRAMUSCULAR | Status: DC | PRN
  Administered 2016-08-07: 2 mg via INTRAVENOUS

## 2016-08-07 MED ORDER — THROMBIN 20000 UNITS EX SOLR
CUTANEOUS | Status: DC | PRN
  Administered 2016-08-07: 20 mL via TOPICAL

## 2016-08-07 MED ORDER — LACTATED RINGERS IV SOLN
INTRAVENOUS | Status: DC
  Administered 2016-08-07 (×2): via INTRAVENOUS

## 2016-08-07 MED ORDER — PROPOFOL 10 MG/ML IV BOLUS
INTRAVENOUS | Status: AC
  Filled 2016-08-07: qty 20

## 2016-08-07 MED ORDER — DIAZEPAM 5 MG PO TABS
5.0000 mg | ORAL_TABLET | Freq: Four times a day (QID) | ORAL | Status: DC | PRN
  Administered 2016-08-07: 5 mg via ORAL
  Filled 2016-08-07: qty 1

## 2016-08-07 MED ORDER — BISACODYL 5 MG PO TBEC: 5.0000 mg | DELAYED_RELEASE_TABLET | Freq: Every day | ORAL | Status: DC | PRN

## 2016-08-07 MED ORDER — ONDANSETRON HCL 4 MG PO TABS: 4.0000 mg | ORAL_TABLET | Freq: Four times a day (QID) | ORAL | Status: DC | PRN

## 2016-08-07 MED ORDER — MORPHINE SULFATE (PF) 4 MG/ML IV SOLN
1.0000 mg | INTRAVENOUS | Status: DC | PRN
  Administered 2016-08-07: 2 mg via INTRAVENOUS
  Filled 2016-08-07: qty 1

## 2016-08-07 MED ORDER — FLEET ENEMA 7-19 GM/118ML RE ENEM: 1.0000 | ENEMA | Freq: Once | RECTAL | Status: DC | PRN

## 2016-08-07 MED ORDER — ZOLPIDEM TARTRATE 5 MG PO TABS: 5.0000 mg | ORAL_TABLET | Freq: Every evening | ORAL | Status: DC | PRN

## 2016-08-07 MED ORDER — ALBUTEROL SULFATE (2.5 MG/3ML) 0.083% IN NEBU: 3.0000 mL | INHALATION_SOLUTION | RESPIRATORY_TRACT | Status: DC | PRN

## 2016-08-07 MED ORDER — CEFAZOLIN SODIUM-DEXTROSE 2-4 GM/100ML-% IV SOLN
2.0000 g | INTRAVENOUS | Status: AC
  Administered 2016-08-07: 2 g via INTRAVENOUS
  Filled 2016-08-07: qty 100

## 2016-08-07 MED ORDER — BUPIVACAINE-EPINEPHRINE (PF) 0.5% -1:200000 IJ SOLN
INTRAMUSCULAR | Status: AC
  Filled 2016-08-07: qty 30

## 2016-08-07 MED ORDER — LAMOTRIGINE 100 MG PO TABS
100.0000 mg | ORAL_TABLET | Freq: Every day | ORAL | Status: DC
  Administered 2016-08-08: 100 mg via ORAL
  Filled 2016-08-07: qty 1

## 2016-08-07 MED ORDER — LIDOCAINE 2% (20 MG/ML) 5 ML SYRINGE
INTRAMUSCULAR | Status: AC
  Filled 2016-08-07: qty 5

## 2016-08-07 MED ORDER — OXYCODONE HCL 5 MG/5ML PO SOLN: 5.0000 mg | Freq: Once | ORAL | Status: DC | PRN

## 2016-08-07 MED ORDER — ALUM & MAG HYDROXIDE-SIMETH 200-200-20 MG/5ML PO SUSP: 30.0000 mL | Freq: Four times a day (QID) | ORAL | Status: DC | PRN

## 2016-08-07 MED ORDER — FENTANYL CITRATE (PF) 100 MCG/2ML IJ SOLN
INTRAMUSCULAR | Status: DC | PRN
  Administered 2016-08-07: 100 ug via INTRAVENOUS
  Administered 2016-08-07 (×4): 50 ug via INTRAVENOUS

## 2016-08-07 MED ORDER — MIDAZOLAM HCL 2 MG/2ML IJ SOLN
INTRAMUSCULAR | Status: AC
  Filled 2016-08-07: qty 2

## 2016-08-07 MED ORDER — SODIUM CHLORIDE 0.9% FLUSH
3.0000 mL | Freq: Two times a day (BID) | INTRAVENOUS | Status: DC
  Administered 2016-08-07: 3 mL via INTRAVENOUS

## 2016-08-07 MED ORDER — SOD FLUORIDE-POTASSIUM NITRATE 1.1-5 % DT PSTE: 1.0000 "application " | PASTE | Freq: Two times a day (BID) | DENTAL | Status: DC

## 2016-08-07 MED ORDER — OXYCODONE-ACETAMINOPHEN 5-325 MG PO TABS
1.0000 | ORAL_TABLET | ORAL | Status: DC | PRN
  Administered 2016-08-07 – 2016-08-08 (×4): 2 via ORAL
  Filled 2016-08-07 (×4): qty 2

## 2016-08-07 MED ORDER — ONDANSETRON HCL 4 MG/2ML IJ SOLN
INTRAMUSCULAR | Status: AC
  Filled 2016-08-07: qty 2

## 2016-08-07 MED ORDER — PANTOPRAZOLE SODIUM 40 MG PO TBEC
40.0000 mg | DELAYED_RELEASE_TABLET | Freq: Two times a day (BID) | ORAL | Status: DC
  Administered 2016-08-07 – 2016-08-08 (×2): 40 mg via ORAL
  Filled 2016-08-07 (×3): qty 1

## 2016-08-07 MED ORDER — GABAPENTIN 300 MG PO CAPS
300.0000 mg | ORAL_CAPSULE | Freq: Two times a day (BID) | ORAL | Status: DC
  Administered 2016-08-07 – 2016-08-08 (×2): 300 mg via ORAL
  Filled 2016-08-07 (×2): qty 1

## 2016-08-07 MED ORDER — ONDANSETRON HCL 4 MG/2ML IJ SOLN: 4.0000 mg | Freq: Four times a day (QID) | INTRAMUSCULAR | Status: DC | PRN

## 2016-08-07 MED ORDER — ONDANSETRON HCL 4 MG/2ML IJ SOLN
INTRAMUSCULAR | Status: DC | PRN
  Administered 2016-08-07: 4 mg via INTRAVENOUS

## 2016-08-07 MED ORDER — PHENOL 1.4 % MT LIQD
1.0000 | OROMUCOSAL | Status: DC | PRN
  Administered 2016-08-07: 1 via OROMUCOSAL
  Filled 2016-08-07: qty 177

## 2016-08-07 MED ORDER — ROCURONIUM BROMIDE 100 MG/10ML IV SOLN
INTRAVENOUS | Status: DC | PRN
  Administered 2016-08-07: 60 mg via INTRAVENOUS

## 2016-08-07 MED ORDER — HYDROMORPHONE HCL 1 MG/ML IJ SOLN: 0.2500 mg | INTRAMUSCULAR | Status: DC | PRN

## 2016-08-07 MED ORDER — DEXAMETHASONE SODIUM PHOSPHATE 10 MG/ML IJ SOLN
INTRAMUSCULAR | Status: DC | PRN
  Administered 2016-08-07: 10 mg via INTRAVENOUS

## 2016-08-07 MED ORDER — MENTHOL 3 MG MT LOZG: 1.0000 | LOZENGE | OROMUCOSAL | Status: DC | PRN

## 2016-08-07 MED ORDER — SODIUM CHLORIDE 0.9% FLUSH: 3.0000 mL | INTRAVENOUS | Status: DC | PRN

## 2016-08-07 MED ORDER — SUGAMMADEX SODIUM 200 MG/2ML IV SOLN
INTRAVENOUS | Status: AC
  Filled 2016-08-07: qty 2

## 2016-08-07 MED ORDER — PROPOFOL 10 MG/ML IV BOLUS
INTRAVENOUS | Status: DC | PRN
  Administered 2016-08-07: 20 mg via INTRAVENOUS
  Administered 2016-08-07: 140 mg via INTRAVENOUS

## 2016-08-07 MED ORDER — BUPIVACAINE LIPOSOME 1.3 % IJ SUSP
20.0000 mL | INTRAMUSCULAR | Status: DC
Start: 2016-08-07 — End: 2016-08-07
  Filled 2016-08-07: qty 20

## 2016-08-07 MED ORDER — PANTOPRAZOLE SODIUM 40 MG IV SOLR: 40.0000 mg | Freq: Every day | INTRAVENOUS | Status: DC

## 2016-08-07 SURGICAL SUPPLY — 74 items
BENZOIN TINCTURE PRP APPL 2/3 (GAUZE/BANDAGES/DRESSINGS) ×3 IMPLANT
BIT DRILL NEURO 2X3.1 SFT TUCH (MISCELLANEOUS) ×1 IMPLANT
BIT DRILL SRG 14X2.2XFLT CHK (BIT) ×1 IMPLANT
BIT DRL SRG 14X2.2XFLT CHK (BIT) ×1
BLADE CLIPPER SURG (BLADE) IMPLANT
BLADE SURG 15 STRL LF DISP TIS (BLADE) ×1 IMPLANT
BLADE SURG 15 STRL SS (BLADE) ×2
BONE VIVIGEN FORMABLE 1.3CC (Bone Implant) ×3 IMPLANT
BUR MATCHSTICK NEURO 3.0 LAGG (BURR) IMPLANT
CARTRIDGE OIL MAESTRO DRILL (MISCELLANEOUS) ×1 IMPLANT
CLOSURE WOUND 1/2 X4 (GAUZE/BANDAGES/DRESSINGS) ×1
CORDS BIPOLAR (ELECTRODE) ×3 IMPLANT
COVER SURGICAL LIGHT HANDLE (MISCELLANEOUS) ×3 IMPLANT
CRADLE DONUT ADULT HEAD (MISCELLANEOUS) ×3 IMPLANT
DIFFUSER DRILL AIR PNEUMATIC (MISCELLANEOUS) ×3 IMPLANT
DRAIN JACKSON RD 7FR 3/32 (WOUND CARE) IMPLANT
DRAPE C-ARM 42X72 X-RAY (DRAPES) ×3 IMPLANT
DRAPE POUCH INSTRU U-SHP 10X18 (DRAPES) ×3 IMPLANT
DRAPE SURG 17X23 STRL (DRAPES) ×9 IMPLANT
DRILL BIT SKYLINE 14MM (BIT) ×2
DRILL NEURO 2X3.1 SOFT TOUCH (MISCELLANEOUS) ×3
DURAPREP 26ML APPLICATOR (WOUND CARE) ×3 IMPLANT
ELECT COATED BLADE 2.86 ST (ELECTRODE) ×3 IMPLANT
ELECT REM PT RETURN 9FT ADLT (ELECTROSURGICAL) ×3
ELECTRODE REM PT RTRN 9FT ADLT (ELECTROSURGICAL) ×1 IMPLANT
EVACUATOR SILICONE 100CC (DRAIN) IMPLANT
GAUZE SPONGE 4X4 12PLY STRL (GAUZE/BANDAGES/DRESSINGS) ×3 IMPLANT
GAUZE SPONGE 4X4 12PLY STRL LF (GAUZE/BANDAGES/DRESSINGS) ×3 IMPLANT
GAUZE SPONGE 4X4 16PLY XRAY LF (GAUZE/BANDAGES/DRESSINGS) ×3 IMPLANT
GLOVE BIO SURGEON STRL SZ7 (GLOVE) ×3 IMPLANT
GLOVE BIO SURGEON STRL SZ8 (GLOVE) ×3 IMPLANT
GLOVE BIOGEL PI IND STRL 7.0 (GLOVE) ×2 IMPLANT
GLOVE BIOGEL PI IND STRL 8 (GLOVE) ×1 IMPLANT
GLOVE BIOGEL PI INDICATOR 7.0 (GLOVE) ×4
GLOVE BIOGEL PI INDICATOR 8 (GLOVE) ×2
GOWN STRL REUS W/ TWL LRG LVL3 (GOWN DISPOSABLE) ×1 IMPLANT
GOWN STRL REUS W/ TWL XL LVL3 (GOWN DISPOSABLE) ×1 IMPLANT
GOWN STRL REUS W/TWL LRG LVL3 (GOWN DISPOSABLE) ×2
GOWN STRL REUS W/TWL XL LVL3 (GOWN DISPOSABLE) ×2
INTERLOCK LRDTC CRVCL VBR 7MM (Bone Implant) ×1 IMPLANT
IV CATH 14GX2 1/4 (CATHETERS) ×3 IMPLANT
KIT BASIN OR (CUSTOM PROCEDURE TRAY) ×3 IMPLANT
KIT ROOM TURNOVER OR (KITS) ×3 IMPLANT
LORDOTIC CERVICAL VBR 7MM SM (Bone Implant) ×3 IMPLANT
MANIFOLD NEPTUNE II (INSTRUMENTS) ×3 IMPLANT
MARKER SKIN DUAL TIP RULER LAB (MISCELLANEOUS) ×3 IMPLANT
NEEDLE PRECISIONGLIDE 27X1.5 (NEEDLE) ×3 IMPLANT
NEEDLE SPNL 20GX3.5 QUINCKE YW (NEEDLE) ×3 IMPLANT
NS IRRIG 1000ML POUR BTL (IV SOLUTION) ×3 IMPLANT
OIL CARTRIDGE MAESTRO DRILL (MISCELLANEOUS) ×3
PACK ORTHO CERVICAL (CUSTOM PROCEDURE TRAY) ×3 IMPLANT
PAD ARMBOARD 7.5X6 YLW CONV (MISCELLANEOUS) ×6 IMPLANT
PATTIES SURGICAL .5 X.5 (GAUZE/BANDAGES/DRESSINGS) IMPLANT
PATTIES SURGICAL .5 X1 (DISPOSABLE) IMPLANT
PIN DISTRACTION 14 (PIN) ×6 IMPLANT
PLATE SKYLINE 12MM (Plate) ×3 IMPLANT
SCREW SKYLINE VAR OS 14MM (Screw) ×3 IMPLANT
SPONGE INTESTINAL PEANUT (DISPOSABLE) ×3 IMPLANT
SPONGE SURGIFOAM ABS GEL 100 (HEMOSTASIS) ×3 IMPLANT
STRIP CLOSURE SKIN 1/2X4 (GAUZE/BANDAGES/DRESSINGS) ×2 IMPLANT
SURGIFLO W/THROMBIN 8M KIT (HEMOSTASIS) IMPLANT
SUT MNCRL AB 4-0 PS2 18 (SUTURE) IMPLANT
SUT SILK 4 0 (SUTURE)
SUT SILK 4-0 18XBRD TIE 12 (SUTURE) IMPLANT
SUT VIC AB 2-0 CT2 18 VCP726D (SUTURE) ×3 IMPLANT
SYR BULB IRRIGATION 50ML (SYRINGE) ×3 IMPLANT
SYR CONTROL 10ML LL (SYRINGE) ×6 IMPLANT
TAPE CLOTH 4X10 WHT NS (GAUZE/BANDAGES/DRESSINGS) ×3 IMPLANT
TAPE CLOTH SURG 4X10 WHT LF (GAUZE/BANDAGES/DRESSINGS) ×3 IMPLANT
TAPE UMBILICAL COTTON 1/8X30 (MISCELLANEOUS) ×3 IMPLANT
TOWEL OR 17X24 6PK STRL BLUE (TOWEL DISPOSABLE) ×3 IMPLANT
TOWEL OR 17X26 10 PK STRL BLUE (TOWEL DISPOSABLE) ×3 IMPLANT
WATER STERILE IRR 1000ML POUR (IV SOLUTION) ×3 IMPLANT
YANKAUER SUCT BULB TIP NO VENT (SUCTIONS) ×3 IMPLANT

## 2016-08-07 NOTE — Anesthesia Preprocedure Evaluation (Signed)
Anesthesia Evaluation  Patient identified by MRN, date of birth, ID band Patient awake    Reviewed: Allergy & Precautions, NPO status , Patient's Chart, lab work & pertinent test results  History of Anesthesia Complications Negative for: history of anesthetic complications  Airway Mallampati: II  TM Distance: >3 FB Neck ROM: Full    Dental  (+) Partial Lower   Pulmonary asthma , former smoker,    breath sounds clear to auscultation       Cardiovascular hypertension, Pt. on medications  Rhythm:Regular     Neuro/Psych  Neuromuscular disease    GI/Hepatic GERD  Medicated and Controlled,  Endo/Other    Renal/GU      Musculoskeletal  (+) Arthritis ,   Abdominal   Peds  Hematology   Anesthesia Other Findings   Reproductive/Obstetrics                             Anesthesia Physical Anesthesia Plan  ASA: II  Anesthesia Plan: General   Post-op Pain Management:    Induction: Intravenous  PONV Risk Score and Plan: 3 and Ondansetron, Dexamethasone, Propofol, Midazolam and Treatment may vary due to age  Airway Management Planned: Oral ETT  Additional Equipment: None  Intra-op Plan:   Post-operative Plan:   Informed Consent: I have reviewed the patients History and Physical, chart, labs and discussed the procedure including the risks, benefits and alternatives for the proposed anesthesia with the patient or authorized representative who has indicated his/her understanding and acceptance.   Dental advisory given  Plan Discussed with: CRNA and Surgeon  Anesthesia Plan Comments:         Anesthesia Quick Evaluation

## 2016-08-07 NOTE — Transfer of Care (Signed)
Immediate Anesthesia Transfer of Care Note  Patient: Amy Grant  Procedure(s) Performed: Procedure(s): ANTERIOR CERVICAL DECOMPRESSION FUSION, CERVICAL FOUR-FIVE  WITH INSTRUMENTATION AND ALLOGRAFT (N/A)  Patient Location: PACU  Anesthesia Type:General  Level of Consciousness: awake and patient cooperative  Airway & Oxygen Therapy: Patient Spontanous Breathing and Patient connected to face mask oxygen  Post-op Assessment: Report given to RN and Post -op Vital signs reviewed and stable  Post vital signs: Reviewed and stable  Last Vitals:  Vitals:   08/07/16 0714 08/07/16 1447  BP: (!) 152/88 (!) 161/89  Pulse: 93 90  Resp:  10  Temp: 36.7 C 36.7 C    Last Pain:  Vitals:   08/07/16 1447  PainSc: Asleep         Complications: No apparent anesthesia complications

## 2016-08-07 NOTE — Op Note (Signed)
NAME:  Amy Grant, [name]                     ACCOUNT NO.:  MEDICAL RECORD NO.:  [id number]  PHYSICIAN:  Estill BambergMark Laramie Gelles, MD           DATE OF BIRTH:  DATE OF PROCEDURE:  08/07/2016                              OPERATIVE REPORT   PREOPERATIVE DIAGNOSES: 1. Cervical myelopathy. 2. C4-5 disk protrusion with substantial kyphosis and myelomalacia.  POSTOPERATIVE DIAGNOSES: 1. Cervical myelopathy. 2. C4-5 disk protrusion with substantial kyphosis and myelomalacia.  PROCEDURE: 1. Anterior cervical decompression and fusion, C4-5. 2. Placement of anterior instrumentation, C4-5. 3. Insertion of interbody device x1 (7 mm, lordotic, Titan     intervertebral spacer). 4. Use of morselized allograft - ViviGen. 5. Intraoperative use of fluoroscopy.  SURGEON:  Estill BambergMark Absalom Aro, MD.  ASSISTANJason Coop:  Kayla McKenzie, PA-C.  ANESTHESIA:  General endotracheal anesthesia.  COMPLICATIONS:  None.  DISPOSITION:  Stable.  ESTIMATED BLOOD LOSS:  Minimal.  INDICATIONS FOR SURGERY:  Briefly, [name] is a very pleasant 66 year old female, who did present to me with deterioration in her balance as well as numbness in her bilateral hands.  An MRI did reveal myelomalacia and a disk protrusion at C4-5, minimally compressing the spinal cord.  I did feel that the finding on her MRI was very likely responsible for her current constellation that was very likely responsible for her constellation of symptoms.  Given her ongoing dysfunction, we did discuss proceeding with the procedure reflected above.  The patient was fully aware of the risks and limitations of surgery and did elect to proceed.  OPERATIVE DETAILS:  On August 07, 2016, the patient was brought to surgery and general endotracheal anesthesia was administered.  The patient was placed supine on the hospital bed.  The patient's neck was gently extended.  Her arms were secured to her sides and all bony prominences were meticulously padded.  The neck was  prepped and draped in the usual sterile fashion.  A time-out procedure was performed.  A transverse incision was made to the left side of the midline overlying the C4-5 intervertebral space.  The vertebral bodies of C4 and C5 were subperiosteally exposed.  An anterior diskectomy was performed across the C4-5 intervertebral space.  The posterior longitudinal ligament was identified and entered using a nerve hook.  I then used the #1 followed by #2 Kerrison to perform a thorough and complete C4-5 intervertebral diskectomy, thereby decompressing the spinal canal.  The endplates were prepared and I did place a 7 mm lordotic implant, given the patient's substantial kyphosis.  I did note an excellent press-fit.  A 12 mm anterior cervical Skyline plate was placed.  The 14 mm screws were placed, 2 in each vertebral body at C4 and C5 for total of 4 vertebral body screws.  The screws were then locked to the plate using the Cam locking mechanism.  The wound was then copiously irrigated.  I was very pleased with the final fluoroscopic images.  The wound was then closed in layers using 2-0 Vicryl, followed by 4-0 Monocryl.  Benzoin and Steri- Strips were applied, followed by sterile dressing.  All instrument counts were correct at the termination of the procedure.  Of note, Jason CoopKayla McKenzie, PA-C, was my assistant throughout surgery, and did aid in retraction, suctioning, and closure from  start to finish.     Estill Bamberg, MD     MD/MEDQ  D:  08/07/2016  T:  08/07/2016  Job:  [id number]

## 2016-08-07 NOTE — Anesthesia Postprocedure Evaluation (Signed)
Anesthesia Post Note  Patient: Amy Grant  Procedure(s) Performed: Procedure(s) (LRB): ANTERIOR CERVICAL DECOMPRESSION FUSION, CERVICAL FOUR-FIVE  WITH INSTRUMENTATION AND ALLOGRAFT (N/A)     Patient location during evaluation: PACU Anesthesia Type: General Level of consciousness: awake and sedated Pain management: pain level controlled Vital Signs Assessment: post-procedure vital signs reviewed and stable Respiratory status: spontaneous breathing Cardiovascular status: stable Postop Assessment: no signs of nausea or vomiting Anesthetic complications: no    Last Vitals:  Vitals:   08/07/16 1515 08/07/16 1530  BP: (!) 165/85 (!) 156/72  Pulse:    Resp:    Temp:  36.3 C    Last Pain:  Vitals:   08/07/16 1447  PainSc: Asleep   Pain Goal:                 Norwin Aleman JR,JOHN Charnele Semple

## 2016-08-07 NOTE — Progress Notes (Signed)
Orthopedic Tech Progress Note Patient Details:  Amy Grant [birth year] [id number]  Ortho Devices Type of Ortho Device: Semi-rigid collar Ortho Device/Splint Location: Provided a shower/philly collar for pt to take home and use to bath.  Pt already had soft collar.  Pt is to use shower collar to bath only.    Alvina ChouWilliams, Mechelle Pates C 08/07/2016, 5:38 PM

## 2016-08-07 NOTE — Anesthesia Procedure Notes (Signed)
Procedure Name: Intubation Date/Time: 08/07/2016 12:42 PM Performed by: Rosiland OzMEYERS, Doretha Goding Pre-anesthesia Checklist: Patient identified, Emergency Drugs available, Suction available, Patient being monitored and Timeout performed Patient Re-evaluated:Patient Re-evaluated prior to inductionOxygen Delivery Method: Circle system utilized Preoxygenation: Pre-oxygenation with 100% oxygen Intubation Type: IV induction Ventilation: Mask ventilation without difficulty Laryngoscope Size: Miller and 3 Grade View: Grade I Tube type: Oral Tube size: 7.0 mm Number of attempts: 1 Airway Equipment and Method: Stylet Placement Confirmation: ETT inserted through vocal cords under direct vision,  positive ETCO2 and breath sounds checked- equal and bilateral Secured at: 21 cm Tube secured with: Tape Dental Injury: Teeth and Oropharynx as per pre-operative assessment

## 2016-08-08 ENCOUNTER — Encounter (HOSPITAL_COMMUNITY): Payer: Self-pay | Admitting: Orthopedic Surgery

## 2016-08-08 NOTE — Progress Notes (Signed)
Pt left via wheelchair to car, accompanied by volunteer and husband.

## 2016-08-08 NOTE — Progress Notes (Signed)
    Patient doing well Denies R or L arm pain Minimal neck discomfort   Physical Exam: Vitals:   08/07/16 2355 08/08/16 0500  BP: (!) 140/54 119/75  Pulse: (!) 106 93  Resp: 16 16  Temp: 97.4 F (36.3 C) 97.3 F (36.3 C)    Dressing in place NVI Neck soft/supple  POD #1 s/p C4/5 ACDF, doing well  - encourage ambulation - Percocet for pain, Valium for muscle spasms - likely d/c home later today

## 2016-08-08 NOTE — Progress Notes (Signed)
Dc instructions given to patient at this time.  Pt has no s/s of any acute distress.  Significant other attentive at bedside.  To dc home after done eating breakfast.

## 2016-08-20 DIAGNOSIS — Z9889 Other specified postprocedural states: Secondary | ICD-10-CM | POA: Diagnosis not present

## 2016-08-20 NOTE — Discharge Summary (Signed)
Patient ID: Amy Grant MRN: [id number] DOB/AGE: 64-May-1952 64 y.o.  Admit date: 08/07/2016 Discharge date: 08/08/2016  Admission Diagnoses:  Active Problems:   Radiculopathy   Discharge Diagnoses:  Same  Past Medical History:  Diagnosis Date  . Arthritis    right hand, DDD L 4 , L 5  . Asthma   . Family history of adverse reaction to anesthesia    Sister woke up during surgery  . GERD (gastroesophageal reflux disease)   . Head injury   . History of blood transfusion   . Hypertension   . Multiple falls   . Seizures (HCC)     after head injury    Surgeries: Procedure(s): ANTERIOR CERVICAL DECOMPRESSION FUSION, CERVICAL FOUR-FIVE  WITH INSTRUMENTATION AND ALLOGRAFT on 08/07/2016   Consultants: None  Discharged Condition: Improved  Hospital Course: Amy Grant is an 66 y.o. female who was admitted 08/07/2016 for operative treatment of myelopathy. Patient has severe unremitting pain that affects sleep, daily activities, and work/hobbies. After pre-op clearance the patient was taken to the operating room on 08/07/2016 and underwent  Procedure(s): ANTERIOR CERVICAL DECOMPRESSION FUSION, CERVICAL FOUR-FIVE  WITH INSTRUMENTATION AND ALLOGRAFT.    Patient was given perioperative antibiotics:  Anti-infectives    Start     Dose/Rate Route Frequency Ordered Stop   08/07/16 2100  ceFAZolin (ANCEF) IVPB 2g/100 mL premix     2 g 200 mL/hr over 30 Minutes Intravenous Every 8 hours 08/07/16 1712 08/08/16 0553   08/07/16 0930  ceFAZolin (ANCEF) IVPB 2g/100 mL premix     2 g 200 mL/hr over 30 Minutes Intravenous To [facility]Stay [facility] 08/07/16 0703 08/07/16 1252       Patient was given sequential compression devices, early ambulation to prevent DVT.  Patient benefited maximally from hospital stay and there were no complications.    Recent vital signs: BP 99/63   Pulse 88   Temp 97.8 F (36.6 C)   Resp 16   Ht 5\' 5"  (1.651 m)   Wt 61.2 kg (135 lb)   SpO2 97%   BMI 22.47  kg/m   Discharge Medications:   Allergies as of 08/08/2016      Reactions   Latex Hives   Sulfa Antibiotics Hives   Linaclotide Itching      Medication List    TAKE these medications   CALTRATE 600+D PO Take 2 tablets by mouth daily.   cyclobenzaprine 10 MG tablet Commonly known as:  FLEXERIL Take 10 mg by mouth daily as needed for muscle spasms.   gabapentin 300 MG capsule Commonly known as:  NEURONTIN Take 300 mg by mouth 2 (two) times daily.   HAIR/SKIN/NAILS Tabs Take 1 tablet by mouth daily.   lamoTRIgine 100 MG tablet Commonly known as:  LAMICTAL Take 100 mg by mouth daily.   multivitamin with minerals Tabs tablet Take 1 tablet by mouth daily.   oxyCODONE-acetaminophen 5-325 MG tablet Commonly known as:  PERCOCET/ROXICET Take 1 tablet by mouth 2 (two) times daily as needed for moderate pain or severe pain.   pantoprazole 40 MG tablet Commonly known as:  PROTONIX Take 40 mg by mouth 2 (two) times daily.   PREVIDENT 5000 SENSITIVE 1.1-5 % Pste Generic drug:  Sod Fluoride-Potassium Nitrate Take 1 application by mouth 2 (two) times daily.   UNABLE TO FIND Take 1 tablet by mouth daily. Melaleuca Unforgettables Memory supplement Phosphatidylserine Omega # Ginkgo Biloba Antioxidants   UNABLE TO FIND Take 1 tablet by mouth 2 (two)  times daily. Melaleuca Estraval Estrogen Supplement   UNABLE TO FIND Take 1 capsule by mouth daily. Melaleuca Replenex Bone Health   UNABLE TO FIND Take 1 tablet by mouth daily. Melaleuca Acuity [place] : Memory Supplement 1000 mcg B12 200 mg Vitamin E  800 mcg Folic Acid   VENTOLIN HFA 108 (90 Base) MCG/ACT inhaler Generic drug:  albuterol Inhale 2 puffs into the lungs every 4 (four) hours as needed for shortness of breath (takes 2 times daily every day).   verapamil 240 MG CR tablet Commonly known as:  CALAN-SR Take 240 mg by mouth at bedtime.       Diagnostic Studies: Dg Chest 2 View  Result Date: 08/01/2016 CLINICAL DATA:   Preoperative evaluation for upcoming cervical fusion EXAM: CHEST  2 VIEW COMPARISON:  None. FINDINGS: The heart size and mediastinal contours are within normal limits. Both lungs are clear. The visualized skeletal structures are unremarkable. IMPRESSION: No active cardiopulmonary disease. Electronically Signed   By: Alcide Clever M.D.   On: 08/01/2016 21:01   Dg Cervical Spine 2 Or 3 Views  Result Date: 08/07/2016 CLINICAL DATA:  Intraoperative imaging for C4-5 ACDF. EXAM: CERVICAL SPINE - 2-3 VIEW; DG C-ARM 61-120 MIN COMPARISON:  MRI cervical spine 07/20/2016. FINDINGS: Two fluoroscopic intraoperative spot views in the lateral projection are provided. On the first image, a probe is at the level of the C4-5 interspace. On the second image, anterior plate and screws interbody spacer in place appear well positioned. IMPRESSION: Intraoperative imaging for C4-5 ACDF.  No acute finding. Electronically Signed   By: Drusilla Kanner M.D.   On: 08/07/2016 14:38   Dg C-arm 1-60 Min  Result Date: 08/07/2016 CLINICAL DATA:  Intraoperative imaging for C4-5 ACDF. EXAM: CERVICAL SPINE - 2-3 VIEW; DG C-ARM 61-120 MIN COMPARISON:  MRI cervical spine 07/20/2016. FINDINGS: Two fluoroscopic intraoperative spot views in the lateral projection are provided. On the first image, a probe is at the level of the C4-5 interspace. On the second image, anterior plate and screws interbody spacer in place appear well positioned. IMPRESSION: Intraoperative imaging for C4-5 ACDF.  No acute finding. Electronically Signed   By: Drusilla Kanner M.D.   On: 08/07/2016 14:38    Disposition: 01-Home or Self Care   POD #1 s/p C4/5 ACDF, doing well  -Written scripts for pain signed and in chart -D/C instructions sheet printed and in chart -D/C today  -F/U in office 2 weeks   Signed: Georga Bora 08/20/2016, 1:30 PM

## 2016-09-15 DIAGNOSIS — Z9889 Other specified postprocedural states: Secondary | ICD-10-CM | POA: Diagnosis not present

## 2016-09-15 DIAGNOSIS — M48062 Spinal stenosis, lumbar region with neurogenic claudication: Secondary | ICD-10-CM | POA: Diagnosis not present

## 2016-09-23 DIAGNOSIS — M542 Cervicalgia: Secondary | ICD-10-CM | POA: Diagnosis not present

## 2016-09-23 DIAGNOSIS — Z9889 Other specified postprocedural states: Secondary | ICD-10-CM | POA: Diagnosis not present

## 2016-09-23 DIAGNOSIS — M4322 Fusion of spine, cervical region: Secondary | ICD-10-CM | POA: Diagnosis not present

## 2016-10-01 DIAGNOSIS — Z9889 Other specified postprocedural states: Secondary | ICD-10-CM | POA: Diagnosis not present

## 2016-10-01 DIAGNOSIS — M4322 Fusion of spine, cervical region: Secondary | ICD-10-CM | POA: Diagnosis not present

## 2016-10-01 DIAGNOSIS — M542 Cervicalgia: Secondary | ICD-10-CM | POA: Diagnosis not present

## 2016-10-08 DIAGNOSIS — Z9889 Other specified postprocedural states: Secondary | ICD-10-CM | POA: Diagnosis not present

## 2016-10-08 DIAGNOSIS — M4322 Fusion of spine, cervical region: Secondary | ICD-10-CM | POA: Diagnosis not present

## 2016-10-08 DIAGNOSIS — M542 Cervicalgia: Secondary | ICD-10-CM | POA: Diagnosis not present

## 2016-10-14 DIAGNOSIS — Z9889 Other specified postprocedural states: Secondary | ICD-10-CM | POA: Diagnosis not present

## 2016-10-14 DIAGNOSIS — M4322 Fusion of spine, cervical region: Secondary | ICD-10-CM | POA: Diagnosis not present

## 2016-10-14 DIAGNOSIS — M542 Cervicalgia: Secondary | ICD-10-CM | POA: Diagnosis not present

## 2016-10-27 DIAGNOSIS — M542 Cervicalgia: Secondary | ICD-10-CM | POA: Diagnosis not present

## 2016-11-06 ENCOUNTER — Other Ambulatory Visit: Payer: Self-pay | Admitting: Orthopedic Surgery

## 2016-11-06 DIAGNOSIS — M5412 Radiculopathy, cervical region: Secondary | ICD-10-CM

## 2016-11-13 ENCOUNTER — Other Ambulatory Visit: Payer: Medicare Other

## 2016-11-20 ENCOUNTER — Ambulatory Visit
Admission: RE | Admit: 2016-11-20 | Discharge: 2016-11-20 | Disposition: A | Discharge status: Home / Self Care | Payer: Medicare Other | Source: Ambulatory Visit | Attending: Orthopedic Surgery | Admitting: Orthopedic Surgery

## 2016-11-20 DIAGNOSIS — M47812 Spondylosis without myelopathy or radiculopathy, cervical region: Secondary | ICD-10-CM | POA: Diagnosis not present

## 2016-11-20 DIAGNOSIS — M5412 Radiculopathy, cervical region: Secondary | ICD-10-CM

## 2016-11-24 DIAGNOSIS — M5416 Radiculopathy, lumbar region: Secondary | ICD-10-CM | POA: Diagnosis not present

## 2016-11-28 DIAGNOSIS — M48062 Spinal stenosis, lumbar region with neurogenic claudication: Secondary | ICD-10-CM | POA: Diagnosis not present

## 2016-12-11 DIAGNOSIS — M48061 Spinal stenosis, lumbar region without neurogenic claudication: Secondary | ICD-10-CM | POA: Diagnosis not present

## 2016-12-11 DIAGNOSIS — M545 Low back pain: Secondary | ICD-10-CM | POA: Diagnosis not present

## 2016-12-11 DIAGNOSIS — M48062 Spinal stenosis, lumbar region with neurogenic claudication: Secondary | ICD-10-CM | POA: Diagnosis not present

## 2017-03-25 ENCOUNTER — Encounter: Payer: Self-pay | Admitting: Internal Medicine

## 2017-03-25 ENCOUNTER — Ambulatory Visit (INDEPENDENT_AMBULATORY_CARE_PROVIDER_SITE_OTHER): Payer: Medicare Other | Admitting: Internal Medicine

## 2017-03-25 VITALS — BP 108/76 | HR 91 | Temp 98.2°F | Ht 65.75 in | Wt 132.0 lb

## 2017-03-25 DIAGNOSIS — G8929 Other chronic pain: Secondary | ICD-10-CM

## 2017-03-25 DIAGNOSIS — M5440 Lumbago with sciatica, unspecified side: Secondary | ICD-10-CM

## 2017-03-25 DIAGNOSIS — Z1231 Encounter for screening mammogram for malignant neoplasm of breast: Secondary | ICD-10-CM

## 2017-03-25 DIAGNOSIS — I10 Essential (primary) hypertension: Secondary | ICD-10-CM

## 2017-03-25 DIAGNOSIS — J4520 Mild intermittent asthma, uncomplicated: Secondary | ICD-10-CM | POA: Diagnosis not present

## 2017-03-25 DIAGNOSIS — M5416 Radiculopathy, lumbar region: Secondary | ICD-10-CM | POA: Diagnosis not present

## 2017-03-25 DIAGNOSIS — Z803 Family history of malignant neoplasm of breast: Secondary | ICD-10-CM | POA: Diagnosis not present

## 2017-03-25 DIAGNOSIS — M1990 Unspecified osteoarthritis, unspecified site: Secondary | ICD-10-CM | POA: Diagnosis not present

## 2017-03-25 DIAGNOSIS — M542 Cervicalgia: Secondary | ICD-10-CM

## 2017-03-25 MED ORDER — MELOXICAM 7.5 MG PO TABS: 7.5000 mg | ORAL_TABLET | Freq: Two times a day (BID) | ORAL | 0 refills | Status: DC | PRN

## 2017-03-25 NOTE — Patient Instructions (Addendum)
Follow up in 4 months sooner if needed  Call Dr. Yevette Edwards if back pain not better in 3 months [phone]  I will refer you for 3d mammogram and genetic testing for breast cancer  Try Mobic 7.5 mg 1-2 x per day as needed  Try Alpha Lipoic acid for numbness/tingling symptoms Think about Tdap vaccine Take care   TDaP Vaccine (Diphtheria, Tetanus, and Pertussis): What You Need to Know 1. Why get vaccinated? Diphtheria, tetanus, and pertussis are serious diseases caused by bacteria. Diphtheria and pertussis are spread from person to person. Tetanus enters the body through cuts or wounds. DIPHTHERIA causes a thick covering in the back of the throat.  It can lead to breathing problems, paralysis, heart failure, and even death.  TETANUS (Lockjaw) causes painful tightening of the muscles, usually all over the body.  It can lead to "locking" of the jaw so the victim cannot open his mouth or swallow. Tetanus leads to death in up to 2 out of 10 cases.  PERTUSSIS (Whooping Cough) causes coughing spells so bad that it is hard for infants to eat, drink, or breathe. These spells can last for weeks.  It can lead to pneumonia, seizures (jerking and staring spells), brain damage, and death.  Diphtheria, tetanus, and pertussis vaccine (DTaP) can help prevent these diseases. Most children who are vaccinated with DTaP will be protected throughout childhood. Many more children would get these diseases if we stopped vaccinating. DTaP is a safer version of an older vaccine called DTP. DTP is no longer used in the [place]. 2. Who should get DTaP vaccine and when? Children should get 5 doses of DTaP vaccine, one dose at each of the following ages:  2 months  4 months  6 months  15-18 months  4-6 years  DTaP may be given at the same time as other vaccines. 3. Some children should not get DTaP vaccine or should wait  Children with minor illnesses, such as a cold, may be vaccinated. But children  who are moderately or severely ill should usually wait until they recover before getting DTaP vaccine.  Any child who had a life-threatening allergic reaction after a dose of DTaP should not get another dose.  Any child who suffered a brain or nervous system disease within 7 days after a dose of DTaP should not get another dose.  Talk with your doctor if your child: ? had a seizure or collapsed after a dose of DTaP, ? cried non-stop for 3 hours or more after a dose of DTaP, ? had a fever over 105F after a dose of DTaP. Ask your doctor for more information. Some of these children should not get another dose of pertussis vaccine, but may get a vaccine without pertussis, called DT. 4. Older children and adults DTaP is not licensed for adolescents, adults, or children 52 years of age and older. But older people still need protection. A vaccine called Tdap is similar to DTaP. A single dose of Tdap is recommended for people 11 through 64 years of age. Another vaccine, called Td, protects against tetanus and diphtheria, but not pertussis. It is recommended every 10 years. There are separate Vaccine Information Statements for these vaccines. 5. What are the risks from DTaP vaccine? Getting diphtheria, tetanus, or pertussis disease is much riskier than getting DTaP vaccine. However, a vaccine, like any medicine, is capable of causing serious problems, such as severe allergic reactions. The risk of DTaP vaccine causing serious harm, or death, is  extremely small. Mild problems (common)  Fever (up to about 1 child in 4)  Redness or swelling where the shot was given (up to about 1 child in 4)  Soreness or tenderness where the shot was given (up to about 1 child in 4) These problems occur more often after the 4th and 5th doses of the DTaP series than after earlier doses. Sometimes the 4th or 5th dose of DTaP vaccine is followed by swelling of the entire arm or leg in which the shot was given, lasting 1-7  days (up to about 1 child in 30). Other mild problems include:  Fussiness (up to about 1 child in 3)  Tiredness or poor appetite (up to about 1 child in 10)  Vomiting (up to about 1 child in 50) These problems generally occur 1-3 days after the shot. Moderate problems (uncommon)  Seizure (jerking or staring) (about 1 child out of 14,000)  Non-stop crying, for 3 hours or more (up to about 1 child out of 1,000)  High fever, over 105F (about 1 child out of 16,000) Severe problems (very rare)  Serious allergic reaction (less than 1 out of a million doses)  Several other severe problems have been reported after DTaP vaccine. These include: ? Long-term seizures, coma, or lowered consciousness ? Permanent brain damage. These are so rare it is hard to tell if they are caused by the vaccine. Controlling fever is especially important for children who have had seizures, for any reason. It is also important if another family member has had seizures. You can reduce fever and pain by giving your child an aspirin-free pain reliever when the shot is given, and for the next 24 hours, following the package instructions. 6. What if there is a serious reaction? What should I look for? Look for anything that concerns you, such as signs of a severe allergic reaction, very high fever, or behavior changes. Signs of a severe allergic reaction can include hives, swelling of the face and throat, difficulty breathing, a fast heartbeat, dizziness, and weakness. These would start a few minutes to a few hours after the vaccination. What should I do?  If you think it is a severe allergic reaction or other emergency that can't wait, call 9-1-1 or get the person to the nearest hospital. Otherwise, call your doctor.  Afterward, the reaction should be reported to the Vaccine Adverse Event Reporting System (VAERS). Your doctor might file this report, or you can do it yourself through the VAERS web site at  www.vaers.LAgents.nohhs.gov, or by calling 1-762-510-1150. ? VAERS is only for reporting reactions. They do not give medical advice. 7. The National Vaccine Injury Compensation Program The [employer] Vaccine Injury Compensation Program (VICP) is a federal program that was created to compensate people who may have been injured by certain vaccines. Persons who believe they may have been injured by a vaccine can learn about the program and about filing a claim by calling 1-252-062-7286 or visiting the VICP website at SpiritualWord.atwww.hrsa.gov/vaccinecompensation. 8. How can I learn more?  Ask your doctor.  Call your local or state health department.  Contact the Centers for Disease Control and Prevention (CDC): ? Call 863-856-58261-(520) 797-5259 (1-800-CDC-INFO) or ? Visit CDC's website at PicCapture.uywww.cdc.gov/vaccines CDC DTaP Vaccine (Diphtheria, Tetanus, and Pertussis) VIS (07/17/05) This information is not intended to replace advice given to you by your health care provider. Make sure you discuss any questions you have with your health care provider. Document Released: 12/15/2005 Document Revised: 11/08/2015 Document Reviewed: 11/08/2015 Elsevier  Education  2017 Elsevier Inc.  

## 2017-03-28 ENCOUNTER — Encounter: Payer: Self-pay | Admitting: Internal Medicine

## 2017-03-28 DIAGNOSIS — M1990 Unspecified osteoarthritis, unspecified site: Secondary | ICD-10-CM

## 2017-03-28 DIAGNOSIS — J45909 Unspecified asthma, uncomplicated: Secondary | ICD-10-CM | POA: Insufficient documentation

## 2017-03-28 DIAGNOSIS — I10 Essential (primary) hypertension: Secondary | ICD-10-CM | POA: Insufficient documentation

## 2017-03-28 DIAGNOSIS — M542 Cervicalgia: Secondary | ICD-10-CM | POA: Insufficient documentation

## 2017-03-28 DIAGNOSIS — Z9889 Other specified postprocedural states: Secondary | ICD-10-CM | POA: Insufficient documentation

## 2017-03-28 DIAGNOSIS — G8928 Other chronic postprocedural pain: Secondary | ICD-10-CM

## 2017-03-28 HISTORY — DX: Cervicalgia: M54.2

## 2017-03-28 HISTORY — DX: Other chronic postprocedural pain: G89.28

## 2017-03-28 HISTORY — DX: Unspecified osteoarthritis, unspecified site: M19.90

## 2017-03-28 NOTE — Progress Notes (Signed)
Chief Complaint  Patient presents with  . Establish Care   Establish Dr. [employer] [facility] pt  1. She c/o neck pain and stiffness worse on the left since neck surgery 08/07/16 ACDF. She is having problems with ROM. She has tried heat, Tylenol 500 mg x 1, prn Percocet and Flexeril. She has been hesistant to f/u with ortho spine Dr. Yevette Edwards b/c she really does not want to have another surgery  2. She c/o low back pain and numbness/tingling in legs. She has had 2 steroid injections L4/5 with [facility] and the last time it did not help. She reports pain worse at night. She has tried heat and above medications for pain.  She reports she is using a handicap sticker b/c distance walking makes sx's worse  3. FH breast cancer in mother and sister and is overdue for mammogram. She has been hesistant to go b/c she does not like how painful they are and scared something will be found.  4. Former smoker x 30 years quit 2010 smoked 1-1.5 pk would last x 1 week no FH lung cancer  5. HTN controlled on Verapamil 240 qd    Review of Systems  Constitutional: Negative for weight loss.  HENT: Negative for hearing loss.   Eyes:       No vision changes  Respiratory: Negative for shortness of breath.   Cardiovascular: Negative for chest pain.  Gastrointestinal: Negative for abdominal pain.  Musculoskeletal: Positive for back pain and neck pain.       +neck pain worse on left with ROM problems  +low back pain   Skin: Negative for rash.  Neurological: Positive for sensory change. Negative for headaches.       +numbness in legs   Psychiatric/Behavioral: Negative for memory loss.   Past Medical History:  Diagnosis Date  . Arthritis    right hand, DDD L 4 , L 5  . Asthma   . Family history of adverse reaction to anesthesia    Sister woke up during surgery  . GERD (gastroesophageal reflux disease)   . Head injury   . History of blood transfusion   . Hypertension   . Multiple falls   . Seizures  (HCC)     after head injury   Past Surgical History:  Procedure Laterality Date  . ABDOMINAL HYSTERECTOMY      a ovarary left  . ANTERIOR CERVICAL DECOMP/DISCECTOMY FUSION N/A 08/07/2016   Procedure: ANTERIOR CERVICAL DECOMPRESSION FUSION, CERVICAL FOUR-FIVE  WITH INSTRUMENTATION AND ALLOGRAFT;  Surgeon: Estill Bamberg, MD;  Location: [facility] OR;  Service: Orthopedics;  Laterality: N/A;  . APPENDECTOMY    . BREAST BIOPSY Left    over 20 years ago negative  . CESAREAN SECTION    . COLON RESECTION    . COLONOSCOPY    . EXPLORATORY LAPAROTOMY     after C Section  x 2 - 1 for bleeding and 1 for infection  . HERNIA REPAIR     Inguinal - as a child  . TONSILLECTOMY     Family History  Problem Relation Age of Onset  . Breast cancer Sister 46   Social History   Socioeconomic History  . Marital status: Divorced    Spouse name: Not on file  . Number of children: Not on file  . Years of education: Not on file  . Highest education level: Not on file  Social Needs  . Financial resource strain: Not on file  . Food insecurity - worry:  Not on file  . Food insecurity - inability: Not on file  . Transportation needs - medical: Not on file  . Transportation needs - non-medical: Not on file  Occupational History  . Not on file  Tobacco Use  . Smoking status: Former Smoker    Years: 30.00  . Smokeless tobacco: Never Used  Substance and Sexual Activity  . Alcohol use: Yes    Comment: ocassional glass of wine  . Drug use: No  . Sexual activity: Not on file  Other Topics Concern  . Not on file  Social History Narrative  . Not on file   Current Meds  Medication Sig  . albuterol (PROAIR HFA) 108 (90 Base) MCG/ACT inhaler two puffs q.4-6h. p.r.n.  . Calcium Carbonate-Vitamin D (CALTRATE 600+D PO) Take 2 tablets by mouth daily.  . Cholecalciferol (VITAMIN D3) 1000 units CAPS Take by mouth daily.  . cyclobenzaprine (FLEXERIL) 10 MG tablet Take 10 mg by mouth daily as needed for muscle spasms.   Marland Kitchen gabapentin (NEURONTIN) 300 MG capsule Take 300 mg by mouth 2 (two) times daily.  . Multiple Vitamin (MULTIVITAMIN WITH MINERALS) TABS tablet Take 1 tablet by mouth daily.  . Multiple Vitamins-Minerals (HAIR/SKIN/NAILS) TABS Take 1 tablet by mouth daily.  . Omega-3 300 MG CAPS Take 300 mg by mouth daily.  Marland Kitchen oxyCODONE-acetaminophen (PERCOCET/ROXICET) 5-325 MG tablet Take 1 tablet by mouth 2 (two) times daily as needed for moderate pain or severe pain.  Marland Kitchen PREVIDENT 5000 SENSITIVE 1.1-5 % PSTE Take 1 application by mouth 2 (two) times daily.  Marland Kitchen UNABLE TO FIND Take 1 tablet by mouth daily. Melaleuca Unforgettables Memory supplement Phosphatidylserine Omega # Ginkgo Biloba Antioxidants  . UNABLE TO FIND Take 1 tablet by mouth 2 (two) times daily. Melaleuca Estraval Estrogen Supplement  . UNABLE TO FIND Take 1 capsule by mouth daily. Melaleuca Replenex Bone Health  . UNABLE TO FIND Take 1 tablet by mouth daily. Melaleuca Acuity [place] : Memory Supplement 1000 mcg B12 200 mg Vitamin E  800 mcg Folic Acid  . VENTOLIN HFA 108 (90 Base) MCG/ACT inhaler Inhale 2 puffs into the lungs every 4 (four) hours as needed for shortness of breath (takes 2 times daily every day).   . verapamil (CALAN-SR) 240 MG CR tablet Take 240 mg by mouth at bedtime.    Of note take supplements:  Vitality MVT and mineral 1x per day  Vitality Ca complete 1000 mg/day  Vit D3 4000 IU qd  [place] Omega 3 fish oil 1470 2 per day  Acuity [place] Brain Healthy qd  Unforgettable Brain Healthy 1 capsule/day Vitamin B complex B6/B12 qd   Allergies  Allergen Reactions  . Latex Hives  . Sulfa Antibiotics Hives  . Linaclotide Itching   No results found for this or any previous visit (from the past 2160 hour(s)). Objective  Body mass index is 21.47 kg/m. Wt Readings from Last 3 Encounters:  03/25/17 132 lb (59.9 kg)  08/07/16 135 lb (61.2 kg)  08/01/16 135 lb 4.8 oz (61.4 kg)   Temp Readings from Last 3 Encounters:   03/25/17 98.2 F (36.8 C) (Oral)  08/08/16 97.8 F (36.6 C)  08/01/16 97.8 F (36.6 C) (Oral)   BP Readings from Last 3 Encounters:  03/25/17 108/76  08/08/16 99/63  08/01/16 121/75   Pulse Readings from Last 3 Encounters:  03/25/17 91  08/08/16 88  08/01/16 99   O2 sat room air 98%  Physical Exam  Constitutional: She is oriented to  person, place, and time and well-developed, well-nourished, and in no distress. Vital signs are normal.  HENT:  Head: Normocephalic and atraumatic.  Mouth/Throat: Oropharynx is clear and moist and mucous membranes are normal.  Eyes: Conjunctivae are normal. Pupils are equal, round, and reactive to light.  Cardiovascular: Normal rate, regular rhythm and normal heart sounds.  Pulmonary/Chest: Effort normal and breath sounds normal.  Abdominal: Soft. Bowel sounds are normal. There is no tenderness.  Musculoskeletal: She exhibits tenderness.  Neurological: She is alert and oriented to person, place, and time. Gait normal. Gait normal.  Skin: Skin is warm, dry and intact. No rash noted.  Psychiatric: Mood, memory, affect and judgment normal.  Nursing note and vitals reviewed.   Assessment   1. Cervicalgia s/p ACDF 08/07/16 with Dr. Yevette Edwardsumonski  2.  Low back pain with radiculopathy b/l s/p steroid inj x 2 L4/5 w/o relief  Reviewed MRI lumbar 04/2016 degenerative changes worse L5-S1 disc desiccation with ht loss, b/l facet effusion, synovial cysts, mild facet arthropathy, disc bulging and arthropathy at some areas marked also marked right neural foraminal narrowing with compression of exiting nerve rool L4/5, L5/S1 disc bulge and b/l foraminal extension of marked facet arthropathy>>>severe b/l neural foraminal stenosis with impingement of exiting L5 nerve roots  3. HM  4. HTN controlled  Plan   1. And 2.  Trial of Mobic 7.5 bid prn  Continue heat  Prn Tylenol, gabapentin, narcotics must come from ortho and if chronic pain clinic  If not better in 3  months needs to call Dr. Yevette Edwardsumonski office back  Consider alpha lipoic acid supplement for neuropathy  Consider autoimmune w/u for jt pain if has not had in the past review [facility] records   3.  Declines flu vx  Disc Tdap vaccine and rec  Consider shingrix in future will need to disc  Colonoscopy 2015 get records from [facility]  Last pap [facility] get records s/p hysterectomy with h/o cervical changes age 67 1 ovary intact  Unclear if had DEXA also will need vit D check In future if has not had  mammo rec overdue last 10/2014 h/o breast cyst per pt and asymmetries per last mammogram + FH breast cancer mother and sister pt [facility] will refer mammogram and [facility] genetics testing  Former smoker 30 years quit in 2010 1-1.5 pk would last 1 week no FH lung cancer. Consider hep B/C testing if has not been done review [facility] records   4. Cont verapamil 240  Cardiologist Dr. Milta DeitersS Khan  Provider: Dr. French Anaracy McLean-Scocuzza-Internal Medicine

## 2017-04-17 DIAGNOSIS — M48062 Spinal stenosis, lumbar region with neurogenic claudication: Secondary | ICD-10-CM | POA: Diagnosis not present

## 2017-04-23 ENCOUNTER — Other Ambulatory Visit: Payer: Self-pay | Admitting: Internal Medicine

## 2017-04-23 ENCOUNTER — Telehealth: Payer: Self-pay | Admitting: Internal Medicine

## 2017-04-23 DIAGNOSIS — I10 Essential (primary) hypertension: Secondary | ICD-10-CM

## 2017-04-23 MED ORDER — VERAPAMIL HCL ER 240 MG PO TBCR: 240.0000 mg | EXTENDED_RELEASE_TABLET | Freq: Every day | ORAL | 1 refills | Status: DC

## 2017-04-23 NOTE — Telephone Encounter (Signed)
Copied from CRM [id number]. Topic: Quick Communication - Rx Refill/Question >> Apr 23, 2017  2:22 PM Oneal GroutSebastian, Jennifer S wrote: Medication: verapamil (CALAN-SR) 240 MG CR tablet    Has the patient contacted their pharmacy? No, previous PCP was ordering   (Agent: If no, request that the patient contact the pharmacy for the refill.)   Preferred Pharmacy (with phone number or street name): [facility] [address] [phone]   Agent: Please be advised that RX refills may take up to 3 business days. We ask that you follow-up with your pharmacy.

## 2017-04-23 NOTE — Telephone Encounter (Signed)
Ok to refill 

## 2017-04-23 NOTE — Telephone Encounter (Signed)
Please advise 

## 2017-04-23 NOTE — Telephone Encounter (Signed)
Rx request for provider review: listed as historical on medication list  Verapamil CR 240 mg  LOV: 03/25/17   Pharmacy: verified

## 2017-04-30 DIAGNOSIS — M48061 Spinal stenosis, lumbar region without neurogenic claudication: Secondary | ICD-10-CM | POA: Diagnosis not present

## 2017-05-01 ENCOUNTER — Other Ambulatory Visit: Payer: Self-pay | Admitting: Internal Medicine

## 2017-05-01 DIAGNOSIS — R569 Unspecified convulsions: Secondary | ICD-10-CM

## 2017-05-01 MED ORDER — LAMOTRIGINE 100 MG PO TABS: 100.0000 mg | ORAL_TABLET | Freq: Two times a day (BID) | ORAL | 0 refills | Status: DC

## 2017-05-08 ENCOUNTER — Encounter: Payer: Self-pay | Admitting: Internal Medicine

## 2017-05-08 DIAGNOSIS — Z803 Family history of malignant neoplasm of breast: Secondary | ICD-10-CM | POA: Diagnosis not present

## 2017-05-08 DIAGNOSIS — Z800 Family history of malignant neoplasm of digestive organs: Secondary | ICD-10-CM | POA: Diagnosis not present

## 2017-05-08 DIAGNOSIS — M48062 Spinal stenosis, lumbar region with neurogenic claudication: Secondary | ICD-10-CM | POA: Diagnosis not present

## 2017-05-08 NOTE — Progress Notes (Signed)
Reviewed [facility] notes per notes colonoscopy 10/21/13 difficult prep rec repeat in 5 years  DEXA 10/31/14  Osteopenia Pap neg 04/12/15 s/p hysterectomy  mammo due

## 2017-06-26 ENCOUNTER — Other Ambulatory Visit: Payer: Self-pay | Admitting: Internal Medicine

## 2017-06-26 DIAGNOSIS — M5440 Lumbago with sciatica, unspecified side: Principal | ICD-10-CM

## 2017-06-26 DIAGNOSIS — G8929 Other chronic pain: Secondary | ICD-10-CM

## 2017-06-26 DIAGNOSIS — M542 Cervicalgia: Secondary | ICD-10-CM

## 2017-06-26 MED ORDER — MELOXICAM 7.5 MG PO TABS: 7.5000 mg | ORAL_TABLET | Freq: Two times a day (BID) | ORAL | 0 refills | Status: DC | PRN

## 2017-07-24 ENCOUNTER — Ambulatory Visit: Payer: Medicare Other | Admitting: Internal Medicine

## 2017-07-28 ENCOUNTER — Other Ambulatory Visit: Payer: Self-pay | Admitting: Internal Medicine

## 2017-07-28 MED ORDER — VERAPAMIL HCL ER 240 MG PO TBCR: 240.0000 mg | EXTENDED_RELEASE_TABLET | Freq: Every day | ORAL | 1 refills | Status: DC

## 2017-09-10 ENCOUNTER — Telehealth: Payer: Self-pay | Admitting: Internal Medicine

## 2017-09-10 NOTE — Telephone Encounter (Unsigned)
Copied from CRM [id number]. Topic: Quick Communication - Rx Refill/Question >> Sep 10, 2017 11:00 AM [name], [name] wrote: Medication: gabapentin (NEURONTIN) 300 MG capsule [[id number]  Has the patient contacted their pharmacy? No  (Agent: If no, request that the patient contact the pharmacy for the refill.) (Agent: If yes, when and what did the pharmacy advise?)  Preferred Pharmacy (with phone number or street name)[phone] [place] [address] [phone] (Phone) :   Agent: Please be advised that RX refills may take up to 3 business days. We ask that you follow-up with your pharmacy.

## 2017-09-10 NOTE — Telephone Encounter (Signed)
Routed to incorrect PCP.  Re-routed to French Anaracy McLean-Scocuzza MD

## 2017-09-11 ENCOUNTER — Other Ambulatory Visit: Payer: Self-pay | Admitting: Internal Medicine

## 2017-09-11 DIAGNOSIS — M5416 Radiculopathy, lumbar region: Secondary | ICD-10-CM

## 2017-09-11 MED ORDER — GABAPENTIN 300 MG PO CAPS: 300.0000 mg | ORAL_CAPSULE | Freq: Four times a day (QID) | ORAL | 2 refills | Status: DC

## 2017-09-25 ENCOUNTER — Other Ambulatory Visit: Payer: Self-pay | Admitting: Internal Medicine

## 2017-09-25 DIAGNOSIS — G8929 Other chronic pain: Secondary | ICD-10-CM

## 2017-09-25 DIAGNOSIS — M5440 Lumbago with sciatica, unspecified side: Principal | ICD-10-CM

## 2017-09-25 DIAGNOSIS — M542 Cervicalgia: Secondary | ICD-10-CM

## 2017-09-25 MED ORDER — MELOXICAM 7.5 MG PO TABS: 7.5000 mg | ORAL_TABLET | Freq: Two times a day (BID) | ORAL | 1 refills | Status: DC | PRN

## 2017-11-03 ENCOUNTER — Other Ambulatory Visit: Payer: Self-pay | Admitting: Internal Medicine

## 2017-11-03 DIAGNOSIS — I10 Essential (primary) hypertension: Secondary | ICD-10-CM

## 2017-11-03 MED ORDER — VERAPAMIL HCL ER 240 MG PO TBCR: 240.0000 mg | EXTENDED_RELEASE_TABLET | Freq: Every day | ORAL | 1 refills | Status: DC

## 2017-11-23 ENCOUNTER — Ambulatory Visit: Payer: Medicare Other | Admitting: Internal Medicine

## 2017-12-03 DIAGNOSIS — M48061 Spinal stenosis, lumbar region without neurogenic claudication: Secondary | ICD-10-CM | POA: Diagnosis not present

## 2017-12-08 ENCOUNTER — Other Ambulatory Visit: Payer: Self-pay | Admitting: Internal Medicine

## 2017-12-08 DIAGNOSIS — M5416 Radiculopathy, lumbar region: Secondary | ICD-10-CM

## 2017-12-08 MED ORDER — GABAPENTIN 300 MG PO CAPS: 300.0000 mg | ORAL_CAPSULE | Freq: Four times a day (QID) | ORAL | 0 refills | Status: DC

## 2017-12-21 DIAGNOSIS — M48062 Spinal stenosis, lumbar region with neurogenic claudication: Secondary | ICD-10-CM | POA: Diagnosis not present

## 2017-12-23 ENCOUNTER — Other Ambulatory Visit: Payer: Self-pay | Admitting: Internal Medicine

## 2017-12-23 DIAGNOSIS — M5440 Lumbago with sciatica, unspecified side: Principal | ICD-10-CM

## 2017-12-23 DIAGNOSIS — M542 Cervicalgia: Secondary | ICD-10-CM

## 2017-12-23 DIAGNOSIS — G8929 Other chronic pain: Secondary | ICD-10-CM

## 2017-12-23 MED ORDER — MELOXICAM 7.5 MG PO TABS: 7.5000 mg | ORAL_TABLET | Freq: Two times a day (BID) | ORAL | 1 refills | Status: DC | PRN

## 2017-12-24 ENCOUNTER — Ambulatory Visit: Payer: Medicare Other

## 2017-12-24 ENCOUNTER — Ambulatory Visit (INDEPENDENT_AMBULATORY_CARE_PROVIDER_SITE_OTHER): Payer: Medicare Other | Admitting: Internal Medicine

## 2017-12-24 ENCOUNTER — Encounter: Payer: Self-pay | Admitting: Internal Medicine

## 2017-12-24 VITALS — BP 110/70 | HR 90 | Temp 98.0°F | Resp 15 | Ht 65.75 in | Wt 141.0 lb

## 2017-12-24 VITALS — BP 110/70 | HR 90 | Temp 98.3°F | Resp 15 | Ht 65.75 in | Wt 141.0 lb

## 2017-12-24 DIAGNOSIS — Z0000 Encounter for general adult medical examination without abnormal findings: Secondary | ICD-10-CM | POA: Diagnosis not present

## 2017-12-24 DIAGNOSIS — Z13818 Encounter for screening for other digestive system disorders: Secondary | ICD-10-CM

## 2017-12-24 DIAGNOSIS — M5416 Radiculopathy, lumbar region: Secondary | ICD-10-CM | POA: Diagnosis not present

## 2017-12-24 DIAGNOSIS — Z1389 Encounter for screening for other disorder: Secondary | ICD-10-CM

## 2017-12-24 DIAGNOSIS — Z1159 Encounter for screening for other viral diseases: Secondary | ICD-10-CM

## 2017-12-24 DIAGNOSIS — E559 Vitamin D deficiency, unspecified: Secondary | ICD-10-CM | POA: Diagnosis not present

## 2017-12-24 DIAGNOSIS — M542 Cervicalgia: Secondary | ICD-10-CM

## 2017-12-24 DIAGNOSIS — Z1231 Encounter for screening mammogram for malignant neoplasm of breast: Secondary | ICD-10-CM

## 2017-12-24 DIAGNOSIS — Z1329 Encounter for screening for other suspected endocrine disorder: Secondary | ICD-10-CM

## 2017-12-24 DIAGNOSIS — K921 Melena: Secondary | ICD-10-CM

## 2017-12-24 DIAGNOSIS — I10 Essential (primary) hypertension: Secondary | ICD-10-CM

## 2017-12-24 LAB — HEMOCCULT GUIAC POC 1CARD (OFFICE): Fecal Occult Blood, POC: NEGATIVE

## 2017-12-24 NOTE — Progress Notes (Signed)
Subjective:   Amy Grant is a 67 y.o. female who presents for an Initial Medicare Annual Wellness Visit.  Review of Systems    No ROS.  Medicare Wellness Visit. Additional risk factors are reflected in the social history.  Cardiac Risk Factors include: advanced age (>29men, >53 women);hypertension     Objective:    Today's Vitals   12/24/17 1334 12/24/17 1339  BP: 110/70   Pulse: 90   Resp: 15   Temp: 98.3 F (36.8 C)   TempSrc: Oral   SpO2: 97%   Weight: 141 lb (64 kg)   Height: 5' 5.75" (1.67 m)   PainSc:  5    Body mass index is 22.93 kg/m.  Advanced Directives 12/24/2017 08/07/2016 08/01/2016  Does Patient Have a Medical Advance Directive? No No No  Would patient like information on creating a medical advance directive? Yes (MAU/Ambulatory/Procedural Areas - Information given) Yes (MAU/Ambulatory/Procedural Areas - Information given) Yes (MAU/Ambulatory/Procedural Areas - Information given)    Current Medications (verified) Outpatient Encounter Medications as of 12/24/2017  Medication Sig  . acetaminophen (TYLENOL) 500 MG tablet Take 500 mg by mouth 2 (two) times daily as needed.  Marland Kitchen albuterol (PROAIR HFA) 108 (90 Base) MCG/ACT inhaler two puffs q.4-6h. p.r.n.  . Calcium Carbonate-Vitamin D (CALTRATE 600+D PO) Take 2 tablets by mouth daily.  . Cholecalciferol (VITAMIN D3) 1000 units CAPS Take by mouth daily.  . cyclobenzaprine (FLEXERIL) 10 MG tablet Take 10 mg by mouth 2 (two) times daily as needed for muscle spasms.   Marland Kitchen gabapentin (NEURONTIN) 300 MG capsule Take 1 capsule (300 mg total) by mouth 4 (four) times daily.  . meloxicam (MOBIC) 7.5 MG tablet Take 1 tablet (7.5 mg total) by mouth 2 (two) times daily as needed for pain.  . Multiple Vitamin (MULTIVITAMIN WITH MINERALS) TABS tablet Take 1 tablet by mouth daily.  . Multiple Vitamins-Minerals (HAIR/SKIN/NAILS) TABS Take 1 tablet by mouth daily.  . Omega-3 300 MG CAPS Take 300 mg by mouth daily.  Marland Kitchen PREVIDENT  5000 SENSITIVE 1.1-5 % PSTE Take 1 application by mouth 2 (two) times daily.  Marland Kitchen UNABLE TO FIND Take 1 tablet by mouth daily. Melaleuca Unforgettables Memory supplement Phosphatidylserine Omega # Ginkgo Biloba Antioxidants  . UNABLE TO FIND Take 1 tablet by mouth 2 (two) times daily. Melaleuca Estraval Estrogen Supplement  . UNABLE TO FIND Take 1 capsule by mouth daily. Melaleuca Replenex Bone Health  . UNABLE TO FIND Take 1 tablet by mouth daily. Melaleuca Acuity [place] : Memory Supplement 1000 mcg B12 200 mg Vitamin E  800 mcg Folic Acid  . VENTOLIN HFA 108 (90 Base) MCG/ACT inhaler Inhale 2 puffs into the lungs every 4 (four) hours as needed for shortness of breath (takes 2 times daily every day).   . verapamil (CALAN-SR) 240 MG CR tablet Take 1 tablet (240 mg total) by mouth at bedtime.  . [DISCONTINUED] lamoTRIgine (LAMICTAL) 100 MG tablet Take 1 tablet (100 mg total) by mouth 2 (two) times daily.  . [DISCONTINUED] oxyCODONE-acetaminophen (PERCOCET/ROXICET) 5-325 MG tablet Take 1 tablet by mouth 2 (two) times daily as needed for moderate pain or severe pain.  . [DISCONTINUED] pantoprazole (PROTONIX) 40 MG tablet Take 40 mg by mouth 2 (two) times daily.   No facility-administered encounter medications on file as of 12/24/2017.     Allergies (verified) Latex; Sulfa antibiotics; and Linaclotide   History: Past Medical History:  Diagnosis Date  . Anxiety   . Arthritis    right  hand, DDD L 4 , L 5  . Asthma   . Blood in stool   . Chronic intermittent post-traumatic headache   . Colon polyps   . Depression   . Family history of adverse reaction to anesthesia    Sister woke up during surgery  . GERD (gastroesophageal reflux disease)   . Head injury   . Headache    h/o migraines   . History of blood transfusion   . Hypertension   . Laryngopharyngeal reflux (LPR)   . Multiple falls   . Pancreatitis    remote hx w/o h/o drinking at that time  . Seizures (HCC)     after head  injury  . Vitamin D deficiency    Past Surgical History:  Procedure Laterality Date  . ABDOMINAL HYSTERECTOMY  1979   has an ovary left s/p hysterectomy 1979; h/o abnormal pap   . acdf     08/07/16 Dr. Yevette Edwards   . ANTERIOR CERVICAL DECOMP/DISCECTOMY FUSION N/A 08/07/2016   Procedure: ANTERIOR CERVICAL DECOMPRESSION FUSION, CERVICAL FOUR-FIVE  WITH INSTRUMENTATION AND ALLOGRAFT;  Surgeon: Estill Bamberg, MD;  Location: [facility] OR;  Service: Orthopedics;  Laterality: N/A;  . APPENDECTOMY    . BREAST BIOPSY Left    over 20 years ago negative left breast  . CESAREAN SECTION    . COLON RESECTION     paritial ? reason done in [place]   . COLONOSCOPY    . EXPLORATORY LAPAROTOMY     after C Section  x 2 - 1 for bleeding and 1 for infection  . HERNIA REPAIR     Inguinal - as a child  . TONSILLECTOMY     Family History  Problem Relation Age of Onset  . Breast cancer Sister 11  . Cancer Sister        breast   . Diabetes Sister   . Miscarriages / Stillbirths Sister   . Cancer Mother        breast   . Heart disease Mother   . Hypertension Mother   . Miscarriages / [place] Mother   . Heart failure Mother   . Multiple sclerosis Father   . Miscarriages / [place] Daughter   . Arthritis Maternal Grandmother   . Hyperlipidemia Maternal Grandmother   . Learning disabilities Maternal Grandmother   . Cancer Maternal Grandfather   . Diabetes Paternal Grandmother   . Arthritis Sister   . Miscarriages / [place] Sister    Social History   Socioeconomic History  . Marital status: Divorced    Spouse name: Not on file  . Number of children: Not on file  . Years of education: Not on file  . Highest education level: Not on file  Occupational History  . Not on file  Social Needs  . Financial resource strain: Not hard at all  . Food insecurity:    Worry: Never true    Inability: Never true  . Transportation needs:    Medical: No    Non-medical: No  Tobacco Use  . Smoking status:  Former Smoker    Years: 30.00  . Smokeless tobacco: Never Used  Substance and Sexual Activity  . Alcohol use: Yes    Alcohol/week: 7.0 standard drinks    Types: 7 Glasses of wine per week    Comment: per week  . Drug use: No  . Sexual activity: Not on file  Lifestyle  . Physical activity:    Days per week: 0 days    Minutes  per session: Not on file  . Stress: Only a little  Relationships  . Social connections:    Talks on phone: Not on file    Gets together: Not on file    Attends religious service: Not on file    Active member of club or organization: Not on file    Attends meetings of clubs or organizations: Not on file    Relationship status: Not on file  Other Topics Concern  . Not on file  Social History Narrative   Associate degree    Wears seat belt, feels safe in marriage    1 daughter     Tobacco Counseling Counseling given: Not Answered   Clinical Intake:  Pre-visit preparation completed: Yes  Pain : 0-10 Pain Score: 5  Pain Type: Chronic pain Pain Location: Back Pain Orientation: Right Pain Radiating Towards: leg Pain Descriptors / Indicators: Burning Pain Onset: More than a month ago Pain Frequency: Intermittent Pain Relieving Factors: Injection.  Medication. Rest.  Effect of Pain on Daily Activities: She paces herself between activities.  Pain Relieving Factors: Injection.  Medication. Rest.   Nutritional Status: BMI of 19-24  Normal Diabetes: No  How often do you need to have someone help you when you read instructions, pamphlets, or other written materials from your doctor or pharmacy?: 1 - Never  Interpreter Needed?: No      Activities of Daily Living In your present state of health, do you have any difficulty performing the following activities: 12/24/2017  Hearing? N  Vision? N  Difficulty concentrating or making decisions? N  Walking or climbing stairs? Y  Dressing or bathing? N  Doing errands, shopping? N  Preparing Food and  eating ? N  Using the Toilet? N  In the past six months, have you accidently leaked urine? N  Do you have problems with loss of bowel control? N  Managing your Medications? N  Managing your Finances? N  Housekeeping or managing your Housekeeping? N  Some recent data might be hidden     Immunizations and Health Maintenance  There is no immunization history on file for this patient. Health Maintenance Due  Topic Date Due  . Hepatitis C Screening  [birth year]  . HIV Screening  07/29/1967  . TETANUS/TDAP  07/29/1971  . MAMMOGRAM  07/29/2002  . COLONOSCOPY  07/29/2002  . PNA vac Low Risk Adult (1 of 2 - PCV13) 07/28/2017    Patient Care Team: McLean-Scocuzza, Pasty Spillers, MD as PCP - General (Internal Medicine)  Indicate any recent Medical Services you may have received from other than [facility] providers in the past year (date may be approximate).     Assessment:   This is a routine wellness examination for Keylee.  The goal of the wellness visit is to assist the patient how to close the gaps in care and create a preventative care plan for the patient.   The roster of all physicians providing medical care to patient is listed in the Snapshot section of the chart.  Osteoarthritis. Taking calcium VIT D as appropriate/Osteoporosis risk reviewed.    Safety issues reviewed; Smoke and carbon monoxide detectors in the home. No firearms in the home. Wears seatbelts when driving or riding with others. No violence in the home.  They do not have excessive sun exposure.  Discussed the need for sun protection: hats, long sleeves and the use of sunscreen if there is significant sun exposure.  No new identified risk were noted.  No failures at ADL's  or IADL's.   BMI- discussed the importance of a healthy diet, water intake and the benefits of aerobic exercise. Educational material provided.   24 hour diet recall: Regular diet  Dental- every 6 months.  Eye- Visual acuity not assessed per  patient preference. Wears corrective lenses.  Sleep patterns- Sleep is broken at night due to pain. Deferred to pcp for follow up.  Health maintenance discussed.   Patient Concerns: Low back pain.  R leg pain.  Deferred to pcp for follow up.   Hearing/Vision screen Hearing Screening Comments: Patient is able to hear conversational tones without difficulty.  No issues reported.   Vision Screening Comments: Follow[facility] by [facility] Wears corrective lenses Visual acuity not assessed per patient preference   Dietary issues and exercise activities discussed: Current Exercise Habits: Home exercise routine, Type of exercise: yoga, Time (Minutes): 10, Frequency (Times/Week): 1, Weekly Exercise (Minutes/Week): 10, Intensity: Mild  Goals    . Healthy Lifestyle     Healthy diet Stay active as tolerated      Depression Screen PHQ 2/9 Scores 12/24/2017  PHQ - 2 Score 0    Fall Risk Fall Risk  12/24/2017  Falls in the past year? No   Cognitive Function: MMSE - Mini Mental State Exam 12/24/2017  Orientation to time 5  Orientation to Place 5  Registration 3  Attention/ Calculation 5  Recall 3  Language- name 2 objects 2  Language- repeat 1  Language- follow 3 step command 3  Language- read & follow direction 1  Write a sentence 1  Copy design 1  Total score 30        Screening Tests Health Maintenance  Topic Date Due  . Hepatitis C Screening  1950-05-06  . HIV Screening  07/29/1967  . TETANUS/TDAP  07/29/1971  . MAMMOGRAM  07/29/2002  . COLONOSCOPY  07/29/2002  . PNA vac Low Risk Adult (1 of 2 - PCV13) 07/28/2017  . INFLUENZA VACCINE  06/01/2018 (Originally 10/01/2017)  . PAP SMEAR  04/11/2018  . DEXA SCAN  Completed     Plan:    End of life planning; Advance aging; Advanced directives discussed. Copy of current HCPOA/Living Will requested upon completion.    I have personally reviewed and noted the following in the patient's chart:   . Medical and social  history . Use of alcohol, tobacco or illicit drugs  . Current medications and supplements . Functional ability and status . Nutritional status . Physical activity . Advanced directives . List of other physicians . Hospitalizations, surgeries, and ER visits in previous 12 months . Vitals . Screenings to include cognitive, depression, and falls . Referrals and appointments  In addition, I have reviewed and discussed with patient certain preventive protocols, quality metrics, and best practice recommendations. A written personalized care plan for preventive services as well as general preventive health recommendations were provided to patient.     Ashok Pall, LPN   16/12/9602

## 2017-12-24 NOTE — Progress Notes (Signed)
Agree with below  tMS 

## 2017-12-24 NOTE — Progress Notes (Signed)
Chief Complaint  Patient presents with  . Gynecologic Exam   F/u 1. C/o lumbar radiculopathy with b/l leg numbness and mid back pain despite recently completing 4 epidural injections last was 2-3 weeks ago. She was given 5 day supply #10 of narcotics Percocet by Dr. Regino Schultze and she has 7 pills left taking prn Tylenol, prn flexeril 10 mg, gabapentin 300 mg qid, mobic 7.5 prn  Symptoms are not better and she is fearful of back surgery Reviewed MRI 04/2016 lumbar see below  For the purposes of this dictation, the lowest well formed intervertebral disc space is assumed to be the L5-S1 level, and there are presumed to be five lumbar-type vertebral bodies.  Grade 1 anterolisthesis of L4 over L5 without evidence of spondylolysis. The vertebral bodies heights are preserved. Mixed endplate degenerative changes worst at L5-S1. Diffuse disc desiccation with prominent height loss of the intervertebral disc at L5-S1.   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 Bilateral facet effusion at L4-L5. Small posteriorly projecting synovial cysts at L4-5 bilaterally and L4-5 L5-S1 on the left. The conus medullaris ends at a normal level.  At L1-L2 there is no significant spinal canal or foraminal stenosis.  At L2-L3 there is mild facet arthropathy that significant spinal canal or foraminal stenosis.  At L3-L4 there is a disc bulge with facet arthropathy and hypertrophy of the ligamenta flava contributing to mild narrowing of the spinal canal without significant foraminal stenosis.  At L4-L5 there is disc uncovering from the anterolisthesis and marked facet arthropathy with hypertrophy of the ligamenta flava as well as a tiny right-sided synovial/ligamentum flavum cyst contributing to severe spinal canal stenosis with impingement of the traversing nerve roots. Marked right neural foraminal narrowing with compression of the exiting nerve root. Mild left neural foraminal  narrowing.  At L5-S1 there is a disc bulge and bilateral foraminal extension and marked facet arthropathy with hypertrophy of the ligamenta flava as well as bilateral endplate osteophytes resulting in severe bilateral neural foraminal stenosis with impingement of the exiting L5 nerve roots. Mild narrowing of the spinal canal.  She asks about hemp extract for back pain   2. C/o red blood in stool fobt today negative she has noted blood on tissue  3. HTN controlled on verapamil 240 mg cr and wants to know if can stop this medication disc we could reduce to 1/2 dose but do not want to stop. She is agreeable to continue same dose will Rx 1/2 dose if pt wants to try but she is agreeable to continue the same dose    Review of Systems  Constitutional: Negative for weight loss.  HENT: Negative for hearing loss.   Eyes: Negative for blurred vision.  Respiratory: Negative for shortness of breath.   Cardiovascular: Negative for chest pain.  Gastrointestinal: Positive for blood in stool. Negative for abdominal pain.  Musculoskeletal: Positive for back pain.  Skin: Negative for rash.  Neurological: Positive for sensory change.  Psychiatric/Behavioral: The patient is nervous/anxious.    Past Medical History:  Diagnosis Date  . Anxiety   . Arthritis    right hand, DDD L 4 , L 5  . Asthma   . Blood in stool   . Chronic intermittent post-traumatic headache   . Colon polyps   . Depression   . Family history of adverse reaction to anesthesia    Sister woke up during surgery  . GERD (gastroesophageal reflux disease)   . Head injury   .  Headache    h/o migraines   . History of blood transfusion   . Hypertension   . Laryngopharyngeal reflux (LPR)   . Multiple falls   . Pancreatitis    remote hx w/o h/o drinking at that time  . Seizures (HCC)     after head injury  . Vitamin D deficiency    Past Surgical History:  Procedure Laterality Date  . ABDOMINAL HYSTERECTOMY  1979   has an ovary  left s/p hysterectomy 1979; h/o abnormal pap   . acdf     08/07/16 Dr. Yevette Edwards   . ANTERIOR CERVICAL DECOMP/DISCECTOMY FUSION N/A 08/07/2016   Procedure: ANTERIOR CERVICAL DECOMPRESSION FUSION, CERVICAL FOUR-FIVE  WITH INSTRUMENTATION AND ALLOGRAFT;  Surgeon: Estill Bamberg, MD;  Location: [facility] OR;  Service: Orthopedics;  Laterality: N/A;  . APPENDECTOMY    . BREAST BIOPSY Left    over 20 years ago negative left breast  . CESAREAN SECTION    . COLON RESECTION     paritial ? reason done in [place]   . COLONOSCOPY    . EXPLORATORY LAPAROTOMY     after C Section  x 2 - 1 for bleeding and 1 for infection  . HERNIA REPAIR     Inguinal - as a child  . TONSILLECTOMY     Family History  Problem Relation Age of Onset  . Breast cancer Sister 3  . Cancer Sister        breast   . Diabetes Sister   . Miscarriages / Stillbirths Sister   . Cancer Mother        breast   . Heart disease Mother   . Hypertension Mother   . Miscarriages / [place] Mother   . Heart failure Mother   . Multiple sclerosis Father   . Miscarriages / [place] Daughter   . Arthritis Maternal Grandmother   . Hyperlipidemia Maternal Grandmother   . Learning disabilities Maternal Grandmother   . Cancer Maternal Grandfather   . Diabetes Paternal Grandmother   . Arthritis Sister   . Miscarriages / [place] Sister    Social History   Socioeconomic History  . Marital status: Divorced    Spouse name: Not on file  . Number of children: Not on file  . Years of education: Not on file  . Highest education level: Not on file  Occupational History  . Not on file  Social Needs  . Financial resource strain: Not hard at all  . Food insecurity:    Worry: Never true    Inability: Never true  . Transportation needs:    Medical: No    Non-medical: No  Tobacco Use  . Smoking status: Former Smoker    Years: 30.00  . Smokeless tobacco: Never Used  Substance and Sexual Activity  . Alcohol use: Yes    Alcohol/week:  7.0 standard drinks    Types: 7 Glasses of wine per week    Comment: per week  . Drug use: No  . Sexual activity: Not on file  Lifestyle  . Physical activity:    Days per week: 0 days    Minutes per session: Not on file  . Stress: Only a little  Relationships  . Social connections:    Talks on phone: Not on file    Gets together: Not on file    Attends religious service: Not on file    Active member of club or organization: Not on file    Attends meetings of clubs or organizations:  Not on file    Relationship status: Not on file  . Intimate partner violence:    Fear of current or ex partner: No    Emotionally abused: No    Physically abused: No    Forced sexual activity: No  Other Topics Concern  . Not on file  Social History Narrative   Associate degree    Wears seat belt, feels safe in marriage    1 daughter    Current Meds  Medication Sig  . acetaminophen (TYLENOL) 500 MG tablet Take 500 mg by mouth 2 (two) times daily as needed.  Marland Kitchen albuterol (PROAIR HFA) 108 (90 Base) MCG/ACT inhaler two puffs q.4-6h. p.r.n.  . Calcium Carbonate-Vitamin D (CALTRATE 600+D PO) Take 2 tablets by mouth daily.  . Cholecalciferol (VITAMIN D3) 1000 units CAPS Take by mouth daily.  . cyclobenzaprine (FLEXERIL) 10 MG tablet Take 10 mg by mouth 2 (two) times daily as needed for muscle spasms.   Marland Kitchen gabapentin (NEURONTIN) 300 MG capsule Take 1 capsule (300 mg total) by mouth 4 (four) times daily.  . meloxicam (MOBIC) 7.5 MG tablet Take 1 tablet (7.5 mg total) by mouth 2 (two) times daily as needed for pain.  . Multiple Vitamin (MULTIVITAMIN WITH MINERALS) TABS tablet Take 1 tablet by mouth daily.  . Multiple Vitamins-Minerals (HAIR/SKIN/NAILS) TABS Take 1 tablet by mouth daily.  . Omega-3 300 MG CAPS Take 300 mg by mouth daily.  Marland Kitchen PREVIDENT 5000 SENSITIVE 1.1-5 % PSTE Take 1 application by mouth 2 (two) times daily.  Marland Kitchen UNABLE TO FIND Take 1 tablet by mouth daily. Melaleuca Unforgettables Memory  supplement Phosphatidylserine Omega # Ginkgo Biloba Antioxidants  . UNABLE TO FIND Take 1 tablet by mouth 2 (two) times daily. Melaleuca Estraval Estrogen Supplement  . UNABLE TO FIND Take 1 capsule by mouth daily. Melaleuca Replenex Bone Health  . UNABLE TO FIND Take 1 tablet by mouth daily. Melaleuca Acuity [place] : Memory Supplement 1000 mcg B12 200 mg Vitamin E  800 mcg Folic Acid  . VENTOLIN HFA 108 (90 Base) MCG/ACT inhaler Inhale 2 puffs into the lungs every 4 (four) hours as needed for shortness of breath (takes 2 times daily every day).   . verapamil (CALAN-SR) 240 MG CR tablet Take 1 tablet (240 mg total) by mouth at bedtime.   Allergies  Allergen Reactions  . Latex Hives  . Sulfa Antibiotics Hives  . Linaclotide Itching   No results found for this or any previous visit (from the past 2160 hour(s)). Objective  Body mass index is 22.93 kg/m. Wt Readings from Last 3 Encounters:  12/24/17 141 lb (64 kg)  12/24/17 141 lb (64 kg)  03/25/17 132 lb (59.9 kg)   Temp Readings from Last 3 Encounters:  12/24/17 98 F (36.7 C) (Oral)  12/24/17 98.3 F (36.8 C) (Oral)  03/25/17 98.2 F (36.8 C) (Oral)   BP Readings from Last 3 Encounters:  12/24/17 110/70  12/24/17 110/70  03/25/17 108/76   Pulse Readings from Last 3 Encounters:  12/24/17 90  12/24/17 90  03/25/17 91    Physical Exam  Constitutional: She is oriented to person, place, and time. Vital signs are normal. She appears well-developed and well-nourished. She is cooperative.  HENT:  Head: Normocephalic and atraumatic.  Mouth/Throat: Oropharynx is clear and moist and mucous membranes are normal.  Eyes: Pupils are equal, round, and reactive to light. Conjunctivae are normal.  Cardiovascular: Normal rate, regular rhythm and normal heart sounds.  Pulmonary/Chest: Effort normal  and breath sounds normal. She exhibits no mass and no tenderness. Right breast exhibits no inverted nipple, no mass, no nipple discharge,  no skin change and no tenderness. Left breast exhibits no inverted nipple, no mass, no nipple discharge, no skin change and no tenderness. No breast swelling, tenderness, discharge or bleeding. Breasts are symmetrical.  Genitourinary: Rectal exam shows external hemorrhoid. Rectal exam shows guaiac negative stool. No breast swelling, tenderness, discharge or bleeding.  Musculoskeletal:       Lumbar back: She exhibits tenderness.  +str8 leg test b/l   Neurological: She is alert and oriented to person, place, and time. Gait normal.  Skin: Skin is warm, dry and intact.  Psychiatric: She has a normal mood and affect. Her speech is normal and behavior is normal. Judgment and thought content normal. Cognition and memory are normal.  Nursing note and vitals reviewed.   Assessment   1. Lumbar radiculopathy with herniated disc/DDD, marked arthropathy with hypertrophy failed 4 epidural back injections  2. Blood in stool FOBT negative today with ext hemorrhoids  3. HTN controlled  4. HM Plan   1. Refer back to Dr. Yevette Edwards in [place] ortho spine  Cont meds  2. FOBT negative if continues refer to GI  3. Cont same dose could consider 1/2 dose verapamil 240 to 1/2 dose in future  4.  Check fasting labs   Declines flu vx  Disc Tdap vaccine and rec given Tdap Rx today to get at pharmacy   Consider shingrix in future will need to disc  Consider prevnar and pna 23 vaccines in future   Colonoscopy 2015 get records from [facility]  Last pap 04/12/15 negative likely insurance will not cover another has medicare  -s/p hysterectomy with h/o cervical changes age 89 1 ovary intact  DEXA 09/12/10 osteopenia consider repeat in future  mammo rec overdue last 10/2014 referred today 3d mammo pt to call and sch -normal breast exam today  -h/o breast cyst per pt and asymmetries per last mammogram + FH breast cancer mother and sister pt agreeable will refer mammogram and [facility] genetics testing   Former smoker 30 years quit  in 2010 1-1.5 pk would last 1 week no FH lung cancer. -calc risk score 2.4% consider CT chest lung cancer screening in future disc at f/u   Provider: Dr. French Ana McLean-Scocuzza-Internal Medicine

## 2017-12-24 NOTE — Patient Instructions (Addendum)
  Ms. Radich , Thank you for taking time to come for your Medicare Wellness Visit. I appreciate your ongoing commitment to your health goals. Please review the following plan we discussed and let me know if I can assist you in the future.   Follow up as needed.    Bring a copy of your Health Care Power of Attorney and/or Living Will to be scanned into chart upon completion.  Have a great day!  These are the goals we discussed: Goals    . Healthy Lifestyle     Healthy diet Stay active as tolerated       This is a list of the screening recommended for you and due dates:  Health Maintenance  Topic Date Due  .  Hepatitis C: One time screening is recommended by Center for Disease Control  (CDC) for  adults born from [birth year] through 1963.   Oct 27, 1950  . HIV Screening  07/29/1967  . Tetanus Vaccine  07/29/1971  . Mammogram  07/29/2002  . Colon Cancer Screening  07/29/2002  . Pneumonia vaccines (1 of 2 - PCV13) 07/28/2017  . Flu Shot  06/01/2018*  . Pap Smear  04/11/2018  . DEXA scan (bone density measurement)  Completed  *Topic was postponed. The date shown is not the original due date.

## 2017-12-24 NOTE — Patient Instructions (Addendum)
Alpha lipoic acid supplement over the counter  Follow up with Dr. Yevette Edwards  Consider Tdap vaccine   Tdap/DTaP Vaccine (Diphtheria, Tetanus, and Pertussis): What You Need to Know 1. Why get vaccinated? Diphtheria, tetanus, and pertussis are serious diseases caused by bacteria. Diphtheria and pertussis are spread from person to person. Tetanus enters the body through cuts or wounds. DIPHTHERIA causes a thick covering in the back of the throat.  It can lead to breathing problems, paralysis, heart failure, and even death.  TETANUS (Lockjaw) causes painful tightening of the muscles, usually all over the body.  It can lead to "locking" of the jaw so the victim cannot open his mouth or swallow. Tetanus leads to death in up to 2 out of 10 cases.  PERTUSSIS (Whooping Cough) causes coughing spells so bad that it is hard for infants to eat, drink, or breathe. These spells can last for weeks.  It can lead to pneumonia, seizures (jerking and staring spells), brain damage, and death.  Diphtheria, tetanus, and pertussis vaccine (DTaP) can help prevent these diseases. Most children who are vaccinated with DTaP will be protected throughout childhood. Many more children would get these diseases if we stopped vaccinating. DTaP is a safer version of an older vaccine called DTP. DTP is no longer used in the [place]. 2. Who should get DTaP vaccine and when? Children should get 5 doses of DTaP vaccine, one dose at each of the following ages:  2 months  4 months  6 months  15-18 months  4-6 years  DTaP may be given at the same time as other vaccines. 3. Some children should not get DTaP vaccine or should wait  Children with minor illnesses, such as a cold, may be vaccinated. But children who are moderately or severely ill should usually wait until they recover before getting DTaP vaccine.  Any child who had a life-threatening allergic reaction after a dose of DTaP should not get another  dose.  Any child who suffered a brain or nervous system disease within 7 days after a dose of DTaP should not get another dose.  Talk with your doctor if your child: ? had a seizure or collapsed after a dose of DTaP, ? cried non-stop for 3 hours or more after a dose of DTaP, ? had a fever over 105F after a dose of DTaP. Ask your doctor for more information. Some of these children should not get another dose of pertussis vaccine, but may get a vaccine without pertussis, called DT. 4. Older children and adults DTaP is not licensed for adolescents, adults, or children 38 years of age and older. But older people still need protection. A vaccine called Tdap is similar to DTaP. A single dose of Tdap is recommended for people 11 through 65 years of age. Another vaccine, called Td, protects against tetanus and diphtheria, but not pertussis. It is recommended every 10 years. There are separate Vaccine Information Statements for these vaccines. 5. What are the risks from DTaP vaccine? Getting diphtheria, tetanus, or pertussis disease is much riskier than getting DTaP vaccine. However, a vaccine, like any medicine, is capable of causing serious problems, such as severe allergic reactions. The risk of DTaP vaccine causing serious harm, or death, is extremely small. Mild problems (common)  Fever (up to about 1 child in 4)  Redness or swelling where the shot was given (up to about 1 child in 4)  Soreness or tenderness where the shot was given (up to about 1 child  in 4) These problems occur more often after the 4th and 5th doses of the DTaP series than after earlier doses. Sometimes the 4th or 5th dose of DTaP vaccine is followed by swelling of the entire arm or leg in which the shot was given, lasting 1-7 days (up to about 1 child in 30). Other mild problems include:  Fussiness (up to about 1 child in 3)  Tiredness or poor appetite (up to about 1 child in 10)  Vomiting (up to about 1 child in  50) These problems generally occur 1-3 days after the shot. Moderate problems (uncommon)  Seizure (jerking or staring) (about 1 child out of 14,000)  Non-stop crying, for 3 hours or more (up to about 1 child out of 1,000)  High fever, over 105F (about 1 child out of 16,000) Severe problems (very rare)  Serious allergic reaction (less than 1 out of a million doses)  Several other severe problems have been reported after DTaP vaccine. These include: ? Long-term seizures, coma, or lowered consciousness ? Permanent brain damage. These are so rare it is hard to tell if they are caused by the vaccine. Controlling fever is especially important for children who have had seizures, for any reason. It is also important if another family member has had seizures. You can reduce fever and pain by giving your child an aspirin-free pain reliever when the shot is given, and for the next 24 hours, following the package instructions. 6. What if there is a serious reaction? What should I look for? Look for anything that concerns you, such as signs of a severe allergic reaction, very high fever, or behavior changes. Signs of a severe allergic reaction can include hives, swelling of the face and throat, difficulty breathing, a fast heartbeat, dizziness, and weakness. These would start a few minutes to a few hours after the vaccination. What should I do?  If you think it is a severe allergic reaction or other emergency that can't wait, call 9-1-1 or get the person to the nearest hospital. Otherwise, call your doctor.  Afterward, the reaction should be reported to the Vaccine Adverse Event Reporting System (VAERS). Your doctor might file this report, or you can do it yourself through the VAERS web site at www.vaers.LAgents.no, or by calling 1-(930)594-4500. ? VAERS is only for reporting reactions. They do not give medical advice. 7. The National Vaccine Injury Compensation Program The [employer] Vaccine Injury  Compensation Program (VICP) is a federal program that was created to compensate people who may have been injured by certain vaccines. Persons who believe they may have been injured by a vaccine can learn about the program and about filing a claim by calling 1-419-410-8117 or visiting the VICP website at SpiritualWord.at. 8. How can I learn more?  Ask your doctor.  Call your local or state health department.  Contact the Centers for Disease Control and Prevention (CDC): ? Call (519) 337-5292 (1-800-CDC-INFO) or ? Visit CDC's website at PicCapture.uy CDC DTaP Vaccine (Diphtheria, Tetanus, and Pertussis) VIS (07/17/05) This information is not intended to replace advice given to you by your health care provider. Make sure you discuss any questions you have with your health care provider. Document Released: 12/15/2005 Document Revised: 11/08/2015 Document Reviewed: 11/08/2015 Elsevier Interactive Patient Education  2017 Elsevier Inc.   MRI 04/23/16   For the purposes of this dictation, the lowest well formed intervertebral disc space is assumed to be the L5-S1 level, and there are presumed to be five lumbar-type vertebral bodies.  Grade 1 anterolisthesis of L4 over L5 without evidence of spondylolysis. The vertebral bodies heights are preserved. Mixed endplate degenerative changes worst at L5-S1. Diffuse disc desiccation with prominent height loss of the intervertebral disc at L5-S1.   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 Bilateral facet effusion at L4-L5. Small posteriorly projecting synovial cysts at L4-5 bilaterally and L4-5 L5-S1 on the left. The conus medullaris ends at a normal level.  At L1-L2 there is no significant spinal canal or foraminal stenosis.  At L2-L3 there is mild facet arthropathy that significant spinal canal or foraminal stenosis.  At L3-L4 there is a disc bulge with facet arthropathy and  hypertrophy of the ligamenta flava contributing to mild narrowing of the spinal canal without significant foraminal stenosis.  At L4-L5 there is disc uncovering from the anterolisthesis and marked facet arthropathy with hypertrophy of the ligamenta flava as well as a tiny right-sided synovial/ligamentum flavum cyst contributing to severe spinal canal stenosis with impingement of the traversing nerve roots. Marked right neural foraminal narrowing with compression of the exiting nerve root. Mild left neural foraminal narrowing.  At L5-S1 there is a disc bulge and bilateral foraminal extension and marked facet arthropathy with hypertrophy of the ligamenta flava as well as bilateral endplate osteophytes resulting in severe bilateral neural foraminal stenosis with impingement of the exiting L5 nerve roots. Mild narrowing of the spinal canal.  Status

## 2017-12-30 ENCOUNTER — Other Ambulatory Visit: Payer: Medicare Other

## 2017-12-31 ENCOUNTER — Other Ambulatory Visit (INDEPENDENT_AMBULATORY_CARE_PROVIDER_SITE_OTHER): Payer: Medicare Other

## 2017-12-31 DIAGNOSIS — Z1329 Encounter for screening for other suspected endocrine disorder: Secondary | ICD-10-CM

## 2017-12-31 DIAGNOSIS — Z1159 Encounter for screening for other viral diseases: Secondary | ICD-10-CM | POA: Diagnosis not present

## 2017-12-31 DIAGNOSIS — Z1389 Encounter for screening for other disorder: Secondary | ICD-10-CM | POA: Diagnosis not present

## 2017-12-31 DIAGNOSIS — E559 Vitamin D deficiency, unspecified: Secondary | ICD-10-CM | POA: Diagnosis not present

## 2017-12-31 DIAGNOSIS — Z13818 Encounter for screening for other digestive system disorders: Secondary | ICD-10-CM | POA: Diagnosis not present

## 2017-12-31 DIAGNOSIS — I10 Essential (primary) hypertension: Secondary | ICD-10-CM

## 2017-12-31 LAB — CBC WITH DIFFERENTIAL/PLATELET
BASOS PCT: 0.5 % (ref 0.0–3.0)
Basophils Absolute: 0 10*3/uL (ref 0.0–0.1)
EOS PCT: 6.1 % — AB (ref 0.0–5.0)
Eosinophils Absolute: 0.4 10*3/uL (ref 0.0–0.7)
HCT: 38.1 % (ref 36.0–46.0)
Hemoglobin: 13.1 g/dL (ref 12.0–15.0)
LYMPHS ABS: 2.2 10*3/uL (ref 0.7–4.0)
Lymphocytes Relative: 36.7 % (ref 12.0–46.0)
MCHC: 34.3 g/dL (ref 30.0–36.0)
MCV: 91.9 fl (ref 78.0–100.0)
Monocytes Absolute: 0.5 10*3/uL (ref 0.1–1.0)
Monocytes Relative: 8.5 % (ref 3.0–12.0)
NEUTROS ABS: 2.9 10*3/uL (ref 1.4–7.7)
NEUTROS PCT: 48.2 % (ref 43.0–77.0)
PLATELETS: 242 10*3/uL (ref 150.0–400.0)
RBC: 4.14 Mil/uL (ref 3.87–5.11)
RDW: 12.9 % (ref 11.5–15.5)
WBC: 6 10*3/uL (ref 4.0–10.5)

## 2017-12-31 LAB — COMPREHENSIVE METABOLIC PANEL
ALT: 35 U/L (ref 0–35)
AST: 36 U/L (ref 0–37)
Albumin: 4.2 g/dL (ref 3.5–5.2)
Alkaline Phosphatase: 76 U/L (ref 39–117)
BUN: 13 mg/dL (ref 6–23)
CHLORIDE: 107 meq/L (ref 96–112)
CO2: 27 meq/L (ref 19–32)
CREATININE: 0.93 mg/dL (ref 0.40–1.20)
Calcium: 9 mg/dL (ref 8.4–10.5)
GFR: 77.71 mL/min (ref >60.00)
Glucose, Bld: 98 mg/dL (ref 70–99)
POTASSIUM: 4.4 meq/L (ref 3.5–5.1)
Sodium: 139 mEq/L (ref 135–145)
TOTAL PROTEIN: 6.7 g/dL (ref 6.0–8.3)
Total Bilirubin: 0.7 mg/dL (ref 0.2–1.2)

## 2017-12-31 LAB — LIPID PANEL
CHOLESTEROL: 177 mg/dL (ref 0–200)
HDL: 75.2 mg/dL (ref >39.00)
LDL CALC: 86 mg/dL (ref 0–99)
NonHDL: 102.25
TRIGLYCERIDES: 80 mg/dL (ref 0.0–149.0)
Total CHOL/HDL Ratio: 2
VLDL: 16 mg/dL (ref 0.0–40.0)

## 2017-12-31 LAB — VITAMIN D 25 HYDROXY (VIT D DEFICIENCY, FRACTURES): VITD: 66.24 ng/mL (ref 30.00–100.00)

## 2017-12-31 LAB — TSH: TSH: 1.21 u[IU]/mL (ref 0.35–4.50)

## 2017-12-31 NOTE — Addendum Note (Signed)
Addended by: Penne Lash on: 12/31/2017 11:01 AM   Modules accepted: Orders

## 2018-01-01 LAB — URINALYSIS, ROUTINE W REFLEX MICROSCOPIC
Bilirubin, UA: NEGATIVE
GLUCOSE, UA: NEGATIVE
Ketones, UA: NEGATIVE
Leukocytes, UA: NEGATIVE
NITRITE UA: NEGATIVE
PH UA: 7.5 (ref 5.0–7.5)
Protein, UA: NEGATIVE
RBC UA: NEGATIVE
Specific Gravity, UA: 1.011 (ref 1.005–1.030)
UUROB: 0.2 mg/dL (ref 0.2–1.0)

## 2018-01-05 LAB — HEPATITIS C ANTIBODY
HEP C AB: REACTIVE — AB
SIGNAL TO CUT-OFF: 40.2 — ABNORMAL HIGH (ref <1.00)

## 2018-01-05 LAB — HCV RNA,QUANTITATIVE REAL TIME PCR
HCV Quantitative Log: 6.11 Log IU/mL — ABNORMAL HIGH
HCV RNA, PCR, QN: 1290000 IU/mL — ABNORMAL HIGH

## 2018-01-06 ENCOUNTER — Telehealth: Payer: Self-pay

## 2018-01-06 ENCOUNTER — Other Ambulatory Visit: Payer: Self-pay | Admitting: Internal Medicine

## 2018-01-06 DIAGNOSIS — B1920 Unspecified viral hepatitis C without hepatic coma: Secondary | ICD-10-CM | POA: Insufficient documentation

## 2018-01-06 DIAGNOSIS — B182 Chronic viral hepatitis C: Secondary | ICD-10-CM

## 2018-01-06 NOTE — Telephone Encounter (Signed)
Copied from CRM [id number]. Topic: General - Other >> Jan 06, 2018 10:57 AM Trula Slade wrote: Reason for CRM:   Patient stated the provider called and spoke to her this morning and she would like a return call concerning that conversation.   Please advise?

## 2018-01-06 NOTE — Telephone Encounter (Signed)
Call patient to see what questions she has re Hep C  ask me and will relay back   Thanks TMS

## 2018-01-07 ENCOUNTER — Other Ambulatory Visit: Payer: Self-pay | Admitting: Internal Medicine

## 2018-01-07 DIAGNOSIS — M5416 Radiculopathy, lumbar region: Secondary | ICD-10-CM

## 2018-01-07 NOTE — Telephone Encounter (Signed)
Ok to refill 

## 2018-01-07 NOTE — Telephone Encounter (Signed)
Pt calling again, states she has additional questions for provider about conversation they had.

## 2018-01-07 NOTE — Telephone Encounter (Signed)
Spoken to patient she wanted to know why after all these years she just now became positive. I explain that Dr French Ana has never ordered that test before due to the protocol.  Since she is 67yo she went on ahead and ordered the test this time and it came back positive. Instructed patient that if she had anymore questions, please ask GI. Patient stated hse understood and had no further questions.

## 2018-01-07 NOTE — Telephone Encounter (Signed)
Please advise 

## 2018-01-10 IMAGING — RF DG C-ARM 61-120 MIN
1 series · 2 of 2 positions shown · non-contrast
Comparison: MRI cervical spine 07/20/2016.

CLINICAL DATA: Intraoperative imaging for C4-5 ACDF.

EXAM:
CERVICAL SPINE - 2-3 VIEW; DG C-ARM 61-120 MIN

[Series 1: run · 2 of 2 slices shown]
[im 1/2]
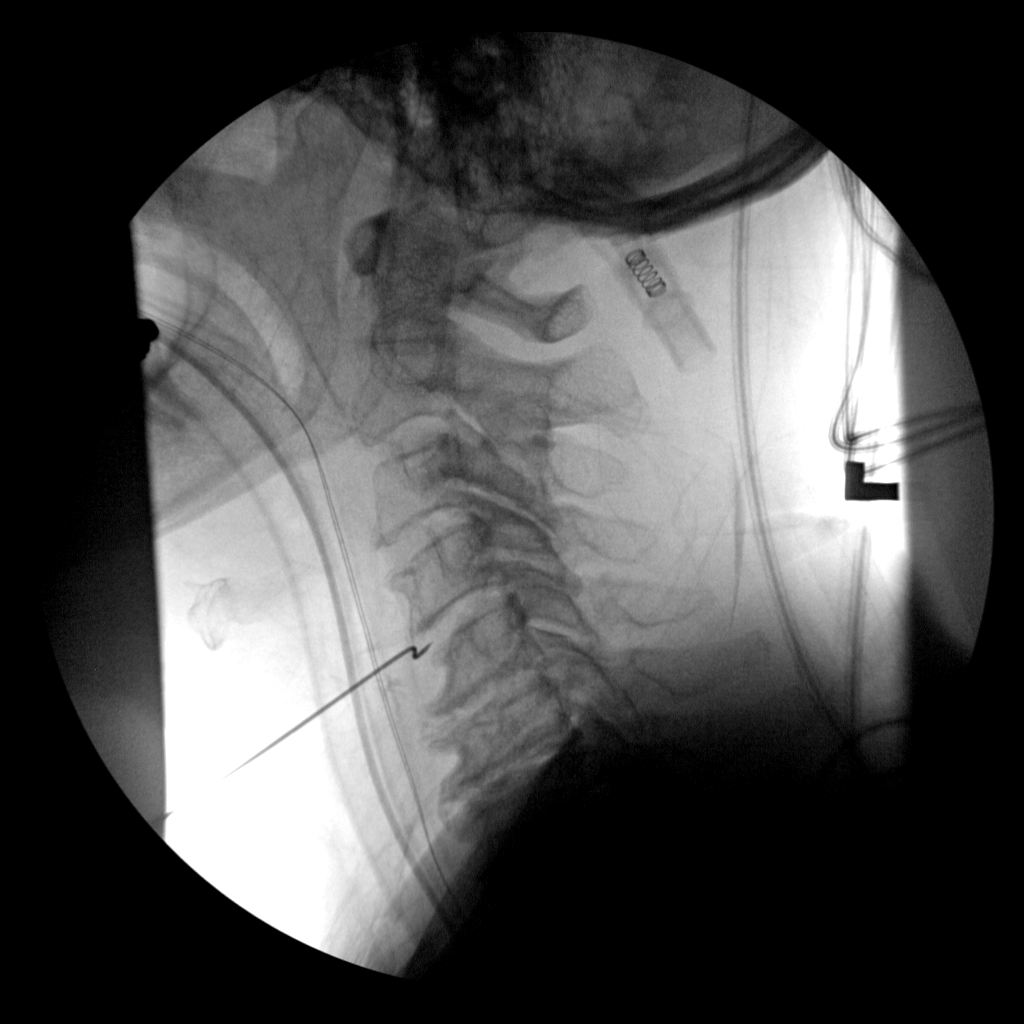
[im 2/2]
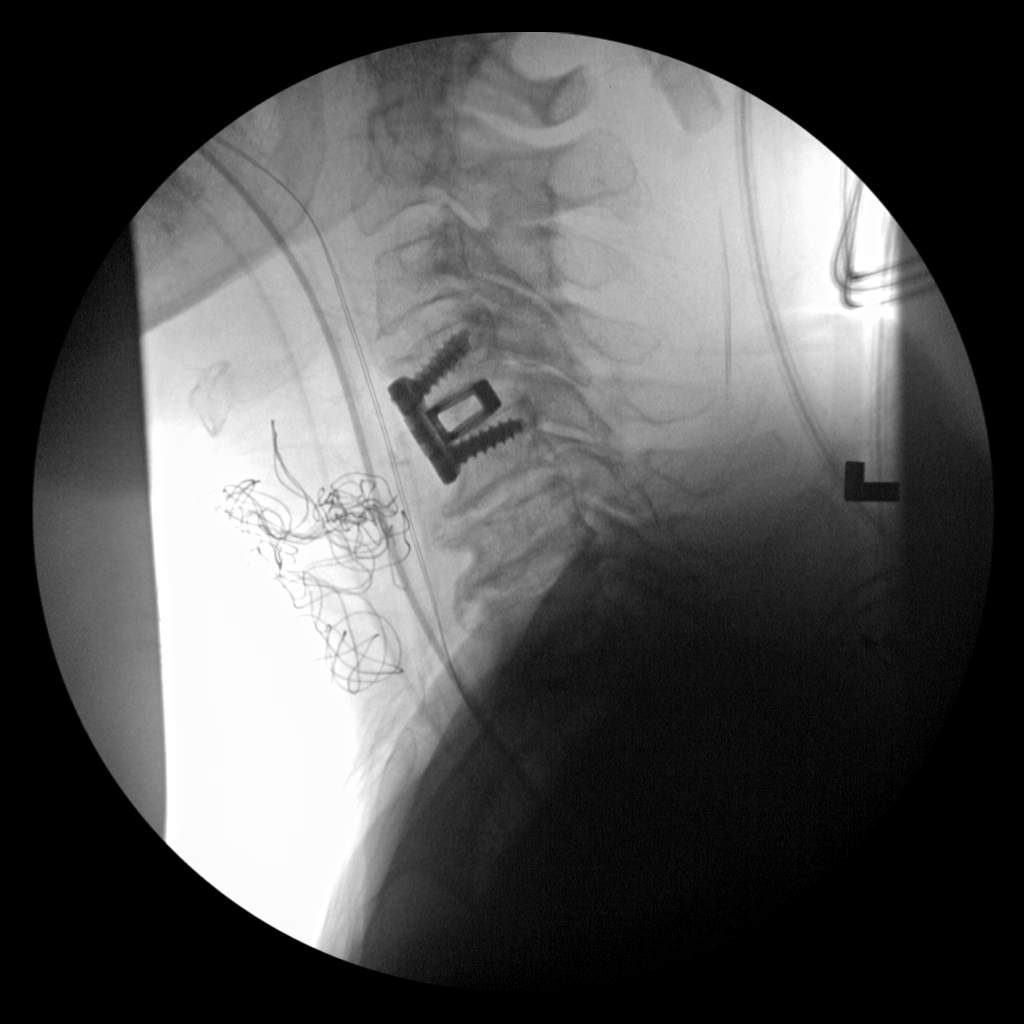

[2 of 2 positions shown; findings below may reference images not displayed]

FINDINGS: Two fluoroscopic intraoperative spot views in the lateral projection
are provided. On the first image, a probe is at the level of the
C4-5 interspace. On the second image, anterior plate and screws
interbody spacer in place appear well positioned.
IMPRESSION: Intraoperative imaging for C4-5 ACDF.  No acute finding.

## 2018-01-11 DIAGNOSIS — M5416 Radiculopathy, lumbar region: Secondary | ICD-10-CM | POA: Diagnosis not present

## 2018-01-11 DIAGNOSIS — M545 Low back pain: Secondary | ICD-10-CM | POA: Diagnosis not present

## 2018-01-14 ENCOUNTER — Other Ambulatory Visit: Payer: Self-pay | Admitting: Orthopedic Surgery

## 2018-01-14 DIAGNOSIS — M5416 Radiculopathy, lumbar region: Secondary | ICD-10-CM

## 2018-01-26 ENCOUNTER — Ambulatory Visit
Admission: RE | Admit: 2018-01-26 | Discharge: 2018-01-26 | Disposition: A | Discharge status: Home / Self Care | Payer: Medicare Other | Source: Ambulatory Visit | Attending: Orthopedic Surgery | Admitting: Orthopedic Surgery

## 2018-01-26 DIAGNOSIS — M5416 Radiculopathy, lumbar region: Secondary | ICD-10-CM

## 2018-01-26 DIAGNOSIS — M48061 Spinal stenosis, lumbar region without neurogenic claudication: Secondary | ICD-10-CM | POA: Diagnosis not present

## 2018-02-01 DIAGNOSIS — M5416 Radiculopathy, lumbar region: Secondary | ICD-10-CM | POA: Diagnosis not present

## 2018-02-16 ENCOUNTER — Other Ambulatory Visit: Payer: Self-pay

## 2018-02-16 ENCOUNTER — Ambulatory Visit (INDEPENDENT_AMBULATORY_CARE_PROVIDER_SITE_OTHER): Payer: Medicare Other | Admitting: Gastroenterology

## 2018-02-16 ENCOUNTER — Encounter

## 2018-02-16 ENCOUNTER — Encounter: Payer: Self-pay | Admitting: Gastroenterology

## 2018-02-16 VITALS — BP 128/73 | HR 80 | Ht 65.0 in | Wt 140.6 lb

## 2018-02-16 DIAGNOSIS — Z114 Encounter for screening for human immunodeficiency virus [HIV]: Secondary | ICD-10-CM

## 2018-02-16 DIAGNOSIS — B182 Chronic viral hepatitis C: Secondary | ICD-10-CM | POA: Diagnosis not present

## 2018-02-16 NOTE — Patient Instructions (Addendum)
You are scheduled for a RUQ with [facility] on Friday, Dec 20th at 10:30am. Please arrive at the medical mall registration desk at 10:5am. You cannot have anything to eat or drink after midnight on Thursday night.   If you need to reschedule this appointment for any reason, please contact central scheduling at [phone].
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
## 2018-02-19 ENCOUNTER — Telehealth: Payer: Self-pay

## 2018-02-19 ENCOUNTER — Ambulatory Visit
Admission: RE | Admit: 2018-02-19 | Discharge: 2018-02-19 | Disposition: A | Discharge status: Home / Self Care | Payer: Medicare Other | Source: Ambulatory Visit | Attending: Gastroenterology | Admitting: Gastroenterology

## 2018-02-19 DIAGNOSIS — B182 Chronic viral hepatitis C: Secondary | ICD-10-CM | POA: Insufficient documentation

## 2018-02-19 DIAGNOSIS — K740 Hepatic fibrosis: Secondary | ICD-10-CM | POA: Diagnosis not present

## 2018-02-19 LAB — HEPATIC FUNCTION PANEL
ALBUMIN: 4.5 g/dL (ref 3.6–4.8)
ALT: 40 IU/L — ABNORMAL HIGH (ref 0–32)
AST: 47 IU/L — ABNORMAL HIGH (ref 0–40)
Alkaline Phosphatase: 81 IU/L (ref 39–117)
Bilirubin Total: 0.9 mg/dL (ref 0.0–1.2)
Bilirubin, Direct: 0.39 mg/dL (ref 0.00–0.40)
TOTAL PROTEIN: 6.9 g/dL (ref 6.0–8.5)

## 2018-02-19 LAB — IRON AND TIBC
Iron Saturation: 42 % (ref 15–55)
Iron: 111 ug/dL (ref 27–139)
Total Iron Binding Capacity: 267 ug/dL (ref 250–450)
UIBC: 156 ug/dL (ref 118–369)

## 2018-02-19 LAB — HIV ANTIBODY (ROUTINE TESTING W REFLEX): HIV Screen 4th Generation wRfx: NONREACTIVE

## 2018-02-19 LAB — HEPATITIS B SURFACE ANTIGEN: Hepatitis B Surface Ag: NEGATIVE

## 2018-02-19 LAB — ANTI-SMOOTH MUSCLE ANTIBODY, IGG: Smooth Muscle Ab: 33 Units — ABNORMAL HIGH (ref 0–19)

## 2018-02-19 LAB — HEPATITIS C GENOTYPE

## 2018-02-19 LAB — MITOCHONDRIAL ANTIBODIES: Mitochondrial Ab: <20 Units (ref 0.0–20.0)

## 2018-02-19 LAB — HEPATITIS A ANTIBODY, TOTAL: Hep A Total Ab: POSITIVE — AB

## 2018-02-19 LAB — HEPATITIS B SURFACE ANTIBODY,QUALITATIVE: Hep B Surface Ab, Qual: NONREACTIVE

## 2018-02-19 LAB — CERULOPLASMIN: Ceruloplasmin: 21.8 mg/dL (ref 19.0–39.0)

## 2018-02-19 LAB — ALPHA-1-ANTITRYPSIN: A-1 Antitrypsin: 116 mg/dL (ref 101–187)

## 2018-02-19 LAB — FERRITIN: Ferritin: 222 ng/mL — ABNORMAL HIGH (ref 15–150)

## 2018-02-19 LAB — ANA: ANA: NEGATIVE

## 2018-02-19 NOTE — Telephone Encounter (Signed)
Pt returned my call. Labs and US results were given.

## 2018-02-19 NOTE — Telephone Encounter (Signed)
-----   Message from Midge Miniumarren Wohl, MD sent at 02/19/2018 12:06 PM EST ----- Let her know the fibrosis score was F0-F1 which means minimal fibrosis.

## 2018-02-19 NOTE — Telephone Encounter (Signed)
-----   Message from Midge Miniumarren Wohl, MD sent at 02/18/2018  7:42 AM EST ----- Let the patient know that she is immune to hepatitis A but not hepatitis B and will need a vaccination for hepatitis B.  The patient also has a HIV that is negative.  We are still waiting for her hepatitis C genotype and then she should be treated for her hepatitis C.  Let her know that her liver enzymes are increased.

## 2018-02-19 NOTE — Telephone Encounter (Signed)
LVM for pt to return my call.

## 2018-03-04 ENCOUNTER — Other Ambulatory Visit: Payer: Self-pay | Admitting: Internal Medicine

## 2018-03-04 DIAGNOSIS — J4520 Mild intermittent asthma, uncomplicated: Secondary | ICD-10-CM

## 2018-03-04 MED ORDER — ALBUTEROL SULFATE HFA 108 (90 BASE) MCG/ACT IN AERS: 1.0000 | INHALATION_SPRAY | Freq: Four times a day (QID) | RESPIRATORY_TRACT | 12 refills | Status: DC | PRN

## 2018-03-15 ENCOUNTER — Other Ambulatory Visit: Payer: Self-pay | Admitting: Orthopedic Surgery

## 2018-03-18 ENCOUNTER — Encounter (HOSPITAL_COMMUNITY)
Admission: RE | Admit: 2018-03-18 | Discharge: 2018-03-18 | Disposition: A | Discharge status: Home / Self Care | Payer: Medicare Other | Source: Ambulatory Visit | Attending: Orthopedic Surgery | Admitting: Orthopedic Surgery

## 2018-03-18 ENCOUNTER — Encounter (HOSPITAL_COMMUNITY): Payer: Self-pay

## 2018-03-18 ENCOUNTER — Other Ambulatory Visit: Payer: Self-pay

## 2018-03-18 DIAGNOSIS — Z01812 Encounter for preprocedural laboratory examination: Secondary | ICD-10-CM | POA: Diagnosis not present

## 2018-03-18 HISTORY — DX: Inflammatory liver disease, unspecified: K75.9

## 2018-03-18 HISTORY — DX: Adverse effect of unspecified anesthetic, initial encounter: T41.45XA

## 2018-03-18 LAB — CBC WITH DIFFERENTIAL/PLATELET
Abs Immature Granulocytes: 0.03 10*3/uL (ref 0.00–0.07)
Basophils Absolute: 0.1 10*3/uL (ref 0.0–0.1)
Basophils Relative: 1 %
Eosinophils Absolute: 0.2 10*3/uL (ref 0.0–0.5)
Eosinophils Relative: 3 %
HEMATOCRIT: 39.6 % (ref 36.0–46.0)
HEMOGLOBIN: 13 g/dL (ref 12.0–15.0)
Immature Granulocytes: 1 %
Lymphocytes Relative: 29 %
Lymphs Abs: 1.8 10*3/uL (ref 0.7–4.0)
MCH: 30.7 pg (ref 26.0–34.0)
MCHC: 32.8 g/dL (ref 30.0–36.0)
MCV: 93.6 fL (ref 80.0–100.0)
Monocytes Absolute: 0.5 10*3/uL (ref 0.1–1.0)
Monocytes Relative: 8 %
Neutro Abs: 3.6 10*3/uL (ref 1.7–7.7)
Neutrophils Relative %: 58 %
Platelets: 197 10*3/uL (ref 150–400)
RBC: 4.23 MIL/uL (ref 3.87–5.11)
RDW: 11.9 % (ref 11.5–15.5)
WBC: 6.1 10*3/uL (ref 4.0–10.5)
nRBC: 0 % (ref 0.0–0.2)

## 2018-03-18 LAB — URINALYSIS, ROUTINE W REFLEX MICROSCOPIC
Bilirubin Urine: NEGATIVE
Glucose, UA: NEGATIVE mg/dL
Hgb urine dipstick: NEGATIVE
KETONES UR: NEGATIVE mg/dL
Leukocytes, UA: NEGATIVE
Nitrite: NEGATIVE
Protein, ur: NEGATIVE mg/dL
Specific Gravity, Urine: 1.014 (ref 1.005–1.030)
pH: 7 (ref 5.0–8.0)

## 2018-03-18 LAB — PROTIME-INR
INR: 1.08
Prothrombin Time: 13.9 seconds (ref 11.4–15.2)

## 2018-03-18 LAB — COMPREHENSIVE METABOLIC PANEL
ALT: 18 U/L (ref 0–44)
ANION GAP: 10 (ref 5–15)
AST: 26 U/L (ref 15–41)
Albumin: 4.4 g/dL (ref 3.5–5.0)
Alkaline Phosphatase: 63 U/L (ref 38–126)
BUN: 11 mg/dL (ref 8–23)
CALCIUM: 9.1 mg/dL (ref 8.9–10.3)
CO2: 24 mmol/L (ref 22–32)
Chloride: 109 mmol/L (ref 98–111)
Creatinine, Ser: 0.92 mg/dL (ref 0.44–1.00)
GFR calc Af Amer: >60 mL/min (ref >60)
GFR calc non Af Amer: >60 mL/min (ref >60)
Glucose, Bld: 100 mg/dL — ABNORMAL HIGH (ref 70–99)
Potassium: 4.2 mmol/L (ref 3.5–5.1)
Sodium: 143 mmol/L (ref 135–145)
TOTAL PROTEIN: 7.7 g/dL (ref 6.5–8.1)
Total Bilirubin: 1.7 mg/dL — ABNORMAL HIGH (ref 0.3–1.2)

## 2018-03-18 LAB — TYPE AND SCREEN
ABO/RH(D): O POS
Antibody Screen: NEGATIVE

## 2018-03-18 LAB — SURGICAL PCR SCREEN
MRSA, PCR: NEGATIVE
Staphylococcus aureus: NEGATIVE

## 2018-03-18 LAB — ABO/RH: ABO/RH(D): O POS

## 2018-03-18 LAB — APTT: aPTT: 32 seconds (ref 24–36)

## 2018-03-18 NOTE — Pre-Procedure Instructions (Signed)
Amy Grant  03/18/2018    Your procedure is scheduled on Wednesday, January 22.  Report to [facility] Admitting at 10: 00 AM                 Your surgery or procedure is scheduled for 1:00 PM    Call this number if you have problems the morning of surgery: [phone]  This is the number for the Pre- Surgical Desk.   Remember:  Do not eat or drink after midnight Tuesday, January 21.    Take these medicines the morning of surgery with A SIP OF WATER :  gabapentin (NEURONTIN)               Take if needed:               acetaminophen (TYLENOL)                cyclobenzaprine (FLEXERIL)                  albuterol (PROAIR HFA)  Inhaler and bring it to the hospital with you         1 Week prior to surgery STOP taking Aspirin, Aspirin Products (Goody Powder, Excedrin Migraine), Ibuprofen (Advil), Naproxen (Aleve) ,meloxicam (MOBIC), Vitamins and Herbal Products (ie Fish Oil).                    Special instructions:  [facility]- Preparing For Surgery  Before surgery, you can play an important role. Because skin is not sterile, your skin needs to be as free of germs as possible. You can reduce the number of germs on your skin by washing with CHG (chlorahexidine gluconate) Soap before surgery.  CHG is an antiseptic cleaner which kills germs and bonds with the skin to continue killing germs even after washing.    Oral Hygiene is also important to reduce your risk of infection.  Remember - BRUSH YOUR TEETH THE MORNING OF SURGERY WITH YOUR REGULAR TOOTHPASTE  Please do not use if you have an allergy to CHG or antibacterial soaps. If your skin becomes reddened/irritated stop using the CHG.  Do not shave (including legs and underarms) for at least 48 hours prior to first CHG shower. It is OK to shave your face.  Please follow these instructions carefully.   1. Shower the NIGHT BEFORE SURGERY and the MORNING OF SURGERY with CHG.   2. If you chose to wash your hair, wash your  hair first as usual with your normal shampoo.  3. After you shampoo, wash your face and private area with the soap you use at home, then rinse your hair and body thoroughly to remove the shampoo and soap.  4. Use CHG as you would any other liquid soap. You can apply CHG directly to the skin and wash gently with a scrungie or a clean washcloth.   5. Apply the CHG Soap to your body ONLY FROM THE NECK DOWN.  Do not use on open wounds or open sores. Avoid contact with your eyes, ears, mouth and genitals (private parts).   6. Wash thoroughly, paying special attention to the area where your surgery will be performed.  7. Thoroughly rinse your body with warm water from the neck down.  8. DO NOT shower/wash with your normal soap after using and rinsing off the CHG Soap.  9. Pat yourself dry with a CLEAN TOWEL.  10. Wear CLEAN PAJAMAS to bed the night  before surgery, wear comfortable clothes the morning of surgery  11. Place CLEAN SHEETS on your bed the night of your first shower and DO NOT SLEEP WITH PETS.  Day of Surgery:  Shower as Instructed Above  Do not wear lotions, powders, or perfumes, or deodorant.  Please wear clean clothes to the hospital/surgery center.   Remember to brush your teeth WITH YOUR REGULAR TOOTHPASTE.  Do not wear jewelry, make-up or nail polish.  Do not shave 48 hours prior to surgery.    Do not bring valuables to the hospital.  [facility] is not responsible for any belongings or valuables.  Contacts, dentures or bridgework may not be worn into surgery.  Leave your suitcase in the car.  After surgery it may be brought to your room.  For patients admitted to the hospital, discharge time will be determined by your treatment team.  Patients discharged the day of surgery will not be allowed to drive home.   Please read over the following fact sheets that you were given: Pain Booklet, Patient Instructions for Mupirocin Application, Coughing and Deep Breathing,  Surgical Site Infections.

## 2018-03-18 NOTE — Progress Notes (Signed)
PCP - Dr Karie Schwalbe. Amy Grant  Cardiologist - no  Chest x-ray -   EKG - 03/18/2018  Stress Test - no  ECHO - no  Cardiac Cath - no    Sleep Study - 2013 CPAP - no  LABS- 02/16/2019  ASA-no HA1C-no Fasting Blood Sugar - no Checks Blood Sugar __0___ times a day  Amy Grant has Hep C and is being treated with Mavyret.  Patient has been instructed to not stop medication, patient is bring medication to the hospital with her.  I informed patient to give medication to the staff to have pharmacy to despense medication  Pt denies having chest pain, sob, or fever at this time. All instructions explained to the pt, with a verbal understanding of the material. Pt agrees to go over the instructions while at home for a better understanding. The opportunity to ask questions was provided.

## 2018-03-19 ENCOUNTER — Telehealth: Payer: Self-pay | Admitting: Gastroenterology

## 2018-03-19 NOTE — Telephone Encounter (Signed)
Pt had a question regarding how long she was suppose to be one Hep C treatment. Advised pt she is in treatment for 8 weeks.

## 2018-03-19 NOTE — Telephone Encounter (Signed)
Pt left vm regarding her rx [facility]? She had one prescription for 1 month supply and then received  Another prescription from [facility]  And wanted to know if she really needed the other one because it is very expensive

## 2018-03-24 ENCOUNTER — Inpatient Hospital Stay (HOSPITAL_COMMUNITY)
Admission: RE | Disposition: A | Discharge status: Home / Self Care | Payer: Self-pay | Source: Home / Self Care | Attending: Orthopedic Surgery

## 2018-03-24 ENCOUNTER — Encounter (HOSPITAL_COMMUNITY): Payer: Self-pay

## 2018-03-24 ENCOUNTER — Inpatient Hospital Stay (HOSPITAL_COMMUNITY)
Admission: RE | Admit: 2018-03-24 | Discharge: 2018-03-25 | DRG: 455 | Disposition: A | Discharge status: Home / Self Care | Payer: Medicare Other | Attending: Orthopedic Surgery | Admitting: Orthopedic Surgery

## 2018-03-24 ENCOUNTER — Other Ambulatory Visit: Payer: Self-pay

## 2018-03-24 ENCOUNTER — Inpatient Hospital Stay (HOSPITAL_COMMUNITY): Payer: Medicare Other

## 2018-03-24 ENCOUNTER — Inpatient Hospital Stay (HOSPITAL_COMMUNITY): Payer: Medicare Other | Admitting: Anesthesiology

## 2018-03-24 DIAGNOSIS — F419 Anxiety disorder, unspecified: Secondary | ICD-10-CM | POA: Diagnosis present

## 2018-03-24 DIAGNOSIS — I10 Essential (primary) hypertension: Secondary | ICD-10-CM | POA: Diagnosis present

## 2018-03-24 DIAGNOSIS — Z803 Family history of malignant neoplasm of breast: Secondary | ICD-10-CM

## 2018-03-24 DIAGNOSIS — Z79899 Other long term (current) drug therapy: Secondary | ICD-10-CM | POA: Diagnosis not present

## 2018-03-24 DIAGNOSIS — Z8249 Family history of ischemic heart disease and other diseases of the circulatory system: Secondary | ICD-10-CM

## 2018-03-24 DIAGNOSIS — F329 Major depressive disorder, single episode, unspecified: Secondary | ICD-10-CM | POA: Diagnosis present

## 2018-03-24 DIAGNOSIS — Z882 Allergy status to sulfonamides status: Secondary | ICD-10-CM

## 2018-03-24 DIAGNOSIS — Z981 Arthrodesis status: Secondary | ICD-10-CM

## 2018-03-24 DIAGNOSIS — Z87891 Personal history of nicotine dependence: Secondary | ICD-10-CM

## 2018-03-24 DIAGNOSIS — M4326 Fusion of spine, lumbar region: Secondary | ICD-10-CM | POA: Diagnosis not present

## 2018-03-24 DIAGNOSIS — M48061 Spinal stenosis, lumbar region without neurogenic claudication: Secondary | ICD-10-CM | POA: Diagnosis present

## 2018-03-24 DIAGNOSIS — R296 Repeated falls: Secondary | ICD-10-CM | POA: Diagnosis present

## 2018-03-24 DIAGNOSIS — M5410 Radiculopathy, site unspecified: Secondary | ICD-10-CM | POA: Diagnosis present

## 2018-03-24 DIAGNOSIS — E559 Vitamin D deficiency, unspecified: Secondary | ICD-10-CM | POA: Diagnosis present

## 2018-03-24 DIAGNOSIS — Z888 Allergy status to other drugs, medicaments and biological substances status: Secondary | ICD-10-CM | POA: Diagnosis not present

## 2018-03-24 DIAGNOSIS — Z90710 Acquired absence of both cervix and uterus: Secondary | ICD-10-CM | POA: Diagnosis not present

## 2018-03-24 DIAGNOSIS — M5416 Radiculopathy, lumbar region: Secondary | ICD-10-CM | POA: Diagnosis present

## 2018-03-24 DIAGNOSIS — Z419 Encounter for procedure for purposes other than remedying health state, unspecified: Secondary | ICD-10-CM

## 2018-03-24 DIAGNOSIS — Z91040 Latex allergy status: Secondary | ICD-10-CM | POA: Diagnosis not present

## 2018-03-24 DIAGNOSIS — M4316 Spondylolisthesis, lumbar region: Secondary | ICD-10-CM | POA: Diagnosis present

## 2018-03-24 DIAGNOSIS — Z833 Family history of diabetes mellitus: Secondary | ICD-10-CM

## 2018-03-24 DIAGNOSIS — K219 Gastro-esophageal reflux disease without esophagitis: Secondary | ICD-10-CM | POA: Diagnosis present

## 2018-03-24 HISTORY — PX: TRANSFORAMINAL LUMBAR INTERBODY FUSION (TLIF) WITH PEDICLE SCREW FIXATION 1 LEVEL: SHX6141

## 2018-03-24 SURGERY — TRANSFORAMINAL LUMBAR INTERBODY FUSION (TLIF) WITH PEDICLE SCREW FIXATION 1 LEVEL
Anesthesia: General | Site: Back | Laterality: Right

## 2018-03-24 MED ORDER — SODIUM CHLORIDE 0.9% FLUSH: 3.0000 mL | Freq: Two times a day (BID) | INTRAVENOUS | Status: DC

## 2018-03-24 MED ORDER — OXYCODONE-ACETAMINOPHEN 5-325 MG PO TABS
1.0000 | ORAL_TABLET | ORAL | Status: DC | PRN
  Administered 2018-03-24 – 2018-03-25 (×3): 2 via ORAL
  Filled 2018-03-24 (×3): qty 2

## 2018-03-24 MED ORDER — CEFAZOLIN SODIUM-DEXTROSE 2-4 GM/100ML-% IV SOLN
2.0000 g | INTRAVENOUS | Status: AC
  Administered 2018-03-24: 2 g via INTRAVENOUS

## 2018-03-24 MED ORDER — ONDANSETRON HCL 4 MG/2ML IJ SOLN: 4.0000 mg | Freq: Four times a day (QID) | INTRAMUSCULAR | Status: DC | PRN

## 2018-03-24 MED ORDER — LACTATED RINGERS IV SOLN
INTRAVENOUS | Status: DC | PRN
  Administered 2018-03-24 (×3): via INTRAVENOUS

## 2018-03-24 MED ORDER — POTASSIUM CHLORIDE IN NACL 20-0.9 MEQ/L-% IV SOLN: INTRAVENOUS | Status: DC

## 2018-03-24 MED ORDER — PROPOFOL 500 MG/50ML IV EMUL
INTRAVENOUS | Status: DC | PRN
  Administered 2018-03-24: 40 ug/kg/min via INTRAVENOUS

## 2018-03-24 MED ORDER — SODIUM CHLORIDE 0.9 % IV SOLN: 250.0000 mL | INTRAVENOUS | Status: DC

## 2018-03-24 MED ORDER — 0.9 % SODIUM CHLORIDE (POUR BTL) OPTIME
TOPICAL | Status: DC | PRN
  Administered 2018-03-24 (×3): 1000 mL

## 2018-03-24 MED ORDER — DOCUSATE SODIUM 100 MG PO CAPS
100.0000 mg | ORAL_CAPSULE | Freq: Two times a day (BID) | ORAL | Status: DC
  Administered 2018-03-24 – 2018-03-25 (×2): 100 mg via ORAL
  Filled 2018-03-24 (×2): qty 1

## 2018-03-24 MED ORDER — POVIDONE-IODINE 7.5 % EX SOLN
Freq: Once | CUTANEOUS | Status: DC
  Filled 2018-03-24: qty 118

## 2018-03-24 MED ORDER — HEMOSTATIC AGENTS (NO CHARGE) OPTIME
TOPICAL | Status: DC | PRN
  Administered 2018-03-24: 1

## 2018-03-24 MED ORDER — METHYLENE BLUE 0.5 % INJ SOLN
INTRAVENOUS | Status: DC | PRN
  Administered 2018-03-24: 1 mL

## 2018-03-24 MED ORDER — DEXAMETHASONE SODIUM PHOSPHATE 10 MG/ML IJ SOLN
INTRAMUSCULAR | Status: AC
  Filled 2018-03-24: qty 1

## 2018-03-24 MED ORDER — MORPHINE SULFATE (PF) 2 MG/ML IV SOLN
1.0000 mg | INTRAVENOUS | Status: DC | PRN
  Administered 2018-03-25: 2 mg via INTRAVENOUS
  Filled 2018-03-24: qty 1

## 2018-03-24 MED ORDER — ONDANSETRON HCL 4 MG/2ML IJ SOLN: 4.0000 mg | Freq: Once | INTRAMUSCULAR | Status: DC | PRN

## 2018-03-24 MED ORDER — ROCURONIUM BROMIDE 50 MG/5ML IV SOSY
PREFILLED_SYRINGE | INTRAVENOUS | Status: AC
  Filled 2018-03-24: qty 5

## 2018-03-24 MED ORDER — CEFAZOLIN SODIUM-DEXTROSE 2-4 GM/100ML-% IV SOLN
2.0000 g | Freq: Three times a day (TID) | INTRAVENOUS | Status: AC
  Administered 2018-03-24 – 2018-03-25 (×2): 2 g via INTRAVENOUS
  Filled 2018-03-24 (×2): qty 100

## 2018-03-24 MED ORDER — DEXAMETHASONE SODIUM PHOSPHATE 10 MG/ML IJ SOLN
INTRAMUSCULAR | Status: DC | PRN
  Administered 2018-03-24: 4 mg via INTRAVENOUS

## 2018-03-24 MED ORDER — SUGAMMADEX SODIUM 200 MG/2ML IV SOLN
INTRAVENOUS | Status: DC | PRN
  Administered 2018-03-24: 200 mg via INTRAVENOUS

## 2018-03-24 MED ORDER — BISACODYL 5 MG PO TBEC: 5.0000 mg | DELAYED_RELEASE_TABLET | Freq: Every day | ORAL | Status: DC | PRN

## 2018-03-24 MED ORDER — SUCCINYLCHOLINE CHLORIDE 20 MG/ML IJ SOLN
INTRAMUSCULAR | Status: DC | PRN
  Administered 2018-03-24: 100 mg via INTRAVENOUS

## 2018-03-24 MED ORDER — FENTANYL CITRATE (PF) 100 MCG/2ML IJ SOLN
INTRAMUSCULAR | Status: AC
  Filled 2018-03-24: qty 2

## 2018-03-24 MED ORDER — OXYCODONE HCL 5 MG PO TABS: 5.0000 mg | ORAL_TABLET | Freq: Once | ORAL | Status: DC | PRN

## 2018-03-24 MED ORDER — LACTATED RINGERS IV SOLN
INTRAVENOUS | Status: DC
  Administered 2018-03-24: 11:00:00 via INTRAVENOUS

## 2018-03-24 MED ORDER — PROPOFOL 10 MG/ML IV BOLUS
INTRAVENOUS | Status: DC | PRN
  Administered 2018-03-24: 150 mg via INTRAVENOUS

## 2018-03-24 MED ORDER — THROMBIN 20000 UNITS EX SOLR
CUTANEOUS | Status: DC | PRN
  Administered 2018-03-24: 20000 [IU] via TOPICAL

## 2018-03-24 MED ORDER — FENTANYL CITRATE (PF) 250 MCG/5ML IJ SOLN
INTRAMUSCULAR | Status: AC
  Filled 2018-03-24: qty 5

## 2018-03-24 MED ORDER — SENNOSIDES-DOCUSATE SODIUM 8.6-50 MG PO TABS
1.0000 | ORAL_TABLET | Freq: Every evening | ORAL | Status: DC | PRN
  Filled 2018-03-24: qty 1

## 2018-03-24 MED ORDER — BUPIVACAINE-EPINEPHRINE (PF) 0.25% -1:200000 IJ SOLN
INTRAMUSCULAR | Status: AC
  Filled 2018-03-24: qty 30

## 2018-03-24 MED ORDER — LIDOCAINE 2% (20 MG/ML) 5 ML SYRINGE
INTRAMUSCULAR | Status: AC
  Filled 2018-03-24: qty 5

## 2018-03-24 MED ORDER — MENTHOL 3 MG MT LOZG: 1.0000 | LOZENGE | OROMUCOSAL | Status: DC | PRN

## 2018-03-24 MED ORDER — DIAZEPAM 5 MG PO TABS
5.0000 mg | ORAL_TABLET | Freq: Four times a day (QID) | ORAL | Status: DC | PRN
  Administered 2018-03-24 – 2018-03-25 (×2): 5 mg via ORAL
  Filled 2018-03-24 (×2): qty 1

## 2018-03-24 MED ORDER — BUPIVACAINE LIPOSOME 1.3 % IJ SUSP
20.0000 mL | INTRAMUSCULAR | Status: AC
  Administered 2018-03-24: 20 mL
  Filled 2018-03-24: qty 20

## 2018-03-24 MED ORDER — LIDOCAINE 2% (20 MG/ML) 5 ML SYRINGE
INTRAMUSCULAR | Status: DC | PRN
  Administered 2018-03-24: 100 mg via INTRAVENOUS

## 2018-03-24 MED ORDER — FENTANYL CITRATE (PF) 100 MCG/2ML IJ SOLN
INTRAMUSCULAR | Status: DC | PRN
  Administered 2018-03-24: 150 ug via INTRAVENOUS
  Administered 2018-03-24: 100 ug via INTRAVENOUS
  Administered 2018-03-24 (×4): 50 ug via INTRAVENOUS

## 2018-03-24 MED ORDER — ALBUTEROL SULFATE (2.5 MG/3ML) 0.083% IN NEBU: 2.5000 mg | INHALATION_SOLUTION | Freq: Four times a day (QID) | RESPIRATORY_TRACT | Status: DC | PRN

## 2018-03-24 MED ORDER — CEFAZOLIN SODIUM-DEXTROSE 2-4 GM/100ML-% IV SOLN
INTRAVENOUS | Status: AC
  Filled 2018-03-24: qty 100

## 2018-03-24 MED ORDER — FENTANYL CITRATE (PF) 100 MCG/2ML IJ SOLN
INTRAMUSCULAR | Status: AC
  Administered 2018-03-24 (×2): 25 ug
  Administered 2018-03-24: 50 ug
  Filled 2018-03-24: qty 2

## 2018-03-24 MED ORDER — PHENOL 1.4 % MT LIQD: 1.0000 | OROMUCOSAL | Status: DC | PRN

## 2018-03-24 MED ORDER — METHYLENE BLUE 0.5 % INJ SOLN
INTRAVENOUS | Status: AC
  Filled 2018-03-24: qty 10

## 2018-03-24 MED ORDER — VERAPAMIL HCL ER 240 MG PO TBCR
240.0000 mg | EXTENDED_RELEASE_TABLET | Freq: Every day | ORAL | Status: DC
  Administered 2018-03-25: 240 mg via ORAL
  Filled 2018-03-24: qty 1

## 2018-03-24 MED ORDER — FENTANYL CITRATE (PF) 100 MCG/2ML IJ SOLN
25.0000 ug | INTRAMUSCULAR | Status: DC | PRN
  Administered 2018-03-24: 25 ug via INTRAVENOUS
  Administered 2018-03-24: 50 ug via INTRAVENOUS
  Administered 2018-03-24: 25 ug via INTRAVENOUS

## 2018-03-24 MED ORDER — BUPIVACAINE-EPINEPHRINE 0.25% -1:200000 IJ SOLN
INTRAMUSCULAR | Status: DC | PRN
  Administered 2018-03-24: 30 mL

## 2018-03-24 MED ORDER — ALUM & MAG HYDROXIDE-SIMETH 200-200-20 MG/5ML PO SUSP: 30.0000 mL | Freq: Four times a day (QID) | ORAL | Status: DC | PRN

## 2018-03-24 MED ORDER — PROPOFOL 10 MG/ML IV BOLUS
INTRAVENOUS | Status: AC
  Filled 2018-03-24: qty 20

## 2018-03-24 MED ORDER — ESMOLOL HCL 100 MG/10ML IV SOLN
INTRAVENOUS | Status: AC
  Filled 2018-03-24: qty 20

## 2018-03-24 MED ORDER — FLEET ENEMA 7-19 GM/118ML RE ENEM: 1.0000 | ENEMA | Freq: Once | RECTAL | Status: DC | PRN

## 2018-03-24 MED ORDER — ONDANSETRON HCL 4 MG/2ML IJ SOLN
INTRAMUSCULAR | Status: AC
  Filled 2018-03-24: qty 2

## 2018-03-24 MED ORDER — THROMBIN (RECOMBINANT) 20000 UNITS EX SOLR
CUTANEOUS | Status: AC
  Filled 2018-03-24: qty 20000

## 2018-03-24 MED ORDER — MIDAZOLAM HCL 2 MG/2ML IJ SOLN
INTRAMUSCULAR | Status: AC
  Filled 2018-03-24: qty 2

## 2018-03-24 MED ORDER — SODIUM CHLORIDE 0.9% FLUSH: 3.0000 mL | INTRAVENOUS | Status: DC | PRN

## 2018-03-24 MED ORDER — ACETAMINOPHEN 650 MG RE SUPP: 650.0000 mg | RECTAL | Status: DC | PRN

## 2018-03-24 MED ORDER — ZOLPIDEM TARTRATE 5 MG PO TABS: 5.0000 mg | ORAL_TABLET | Freq: Every evening | ORAL | Status: DC | PRN

## 2018-03-24 MED ORDER — GLECAPREVIR-PIBRENTASVIR 100-40 MG PO TABS: 3.0000 | ORAL_TABLET | Freq: Every day | ORAL | Status: DC

## 2018-03-24 MED ORDER — ROCURONIUM BROMIDE 10 MG/ML (PF) SYRINGE
PREFILLED_SYRINGE | INTRAVENOUS | Status: DC | PRN
  Administered 2018-03-24: 10 mg via INTRAVENOUS
  Administered 2018-03-24: 50 mg via INTRAVENOUS
  Administered 2018-03-24: 10 mg via INTRAVENOUS

## 2018-03-24 MED ORDER — ONDANSETRON HCL 4 MG PO TABS: 4.0000 mg | ORAL_TABLET | Freq: Four times a day (QID) | ORAL | Status: DC | PRN

## 2018-03-24 MED ORDER — MIDAZOLAM HCL 5 MG/5ML IJ SOLN
INTRAMUSCULAR | Status: DC | PRN
  Administered 2018-03-24: 2 mg via INTRAVENOUS

## 2018-03-24 MED ORDER — GABAPENTIN 300 MG PO CAPS
900.0000 mg | ORAL_CAPSULE | Freq: Two times a day (BID) | ORAL | Status: DC
  Administered 2018-03-24 – 2018-03-25 (×2): 900 mg via ORAL
  Filled 2018-03-24 (×2): qty 3

## 2018-03-24 MED ORDER — ONDANSETRON HCL 4 MG/2ML IJ SOLN
INTRAMUSCULAR | Status: DC | PRN
  Administered 2018-03-24: 4 mg via INTRAVENOUS

## 2018-03-24 MED ORDER — OXYCODONE HCL 5 MG/5ML PO SOLN: 5.0000 mg | Freq: Once | ORAL | Status: DC | PRN

## 2018-03-24 MED ORDER — ACETAMINOPHEN 325 MG PO TABS: 650.0000 mg | ORAL_TABLET | ORAL | Status: DC | PRN

## 2018-03-24 SURGICAL SUPPLY — 77 items
BENZOIN TINCTURE PRP APPL 2/3 (GAUZE/BANDAGES/DRESSINGS) ×3 IMPLANT
BONE VIVIGEN FORMABLE 10CC (Bone Implant) ×3 IMPLANT
BUR PRESCISION 1.7 ELITE (BURR) ×6 IMPLANT
BUR ROUND FLUTED 5 RND (BURR) ×4 IMPLANT
BUR ROUND FLUTED 5MM RND (BURR) ×2
CAGE CONCORDE BULLET 9X9X23 (Cage) ×2 IMPLANT
CAGE SPNL 5D BLT 23X9X9X (Cage) ×1 IMPLANT
CARTRIDGE OIL MAESTRO DRILL (MISCELLANEOUS) ×1 IMPLANT
CLOSURE WOUND 1/2 X4 (GAUZE/BANDAGES/DRESSINGS) ×2
CONT SPEC 4OZ CLIKSEAL STRL BL (MISCELLANEOUS) ×3 IMPLANT
COVER MAYO STAND STRL (DRAPES) ×6 IMPLANT
COVER SURGICAL LIGHT HANDLE (MISCELLANEOUS) ×3 IMPLANT
COVER WAND RF STERILE (DRAPES) ×3 IMPLANT
DIFFUSER DRILL AIR PNEUMATIC (MISCELLANEOUS) ×3 IMPLANT
DRAPE C-ARM 42X72 X-RAY (DRAPES) ×3 IMPLANT
DRAPE C-ARMOR (DRAPES) ×3 IMPLANT
DRAPE POUCH INSTRU U-SHP 10X18 (DRAPES) ×3 IMPLANT
DRAPE SURG 17X23 STRL (DRAPES) ×12 IMPLANT
DURAPREP 26ML APPLICATOR (WOUND CARE) ×3 IMPLANT
ELECT BLADE 4.0 EZ CLEAN MEGAD (MISCELLANEOUS) ×3
ELECT CAUTERY BLADE 6.4 (BLADE) ×3 IMPLANT
ELECT REM PT RETURN 9FT ADLT (ELECTROSURGICAL) ×3
ELECTRODE BLDE 4.0 EZ CLN MEGD (MISCELLANEOUS) ×1 IMPLANT
ELECTRODE REM PT RTRN 9FT ADLT (ELECTROSURGICAL) ×1 IMPLANT
FEE INTRAOP MONITOR IMPULS NCS (MISCELLANEOUS) ×1 IMPLANT
FILTER STRAW FLUID ASPIR (MISCELLANEOUS) ×3 IMPLANT
GAUZE 4X4 16PLY RFD (DISPOSABLE) ×3 IMPLANT
GAUZE SPONGE 4X4 12PLY STRL (GAUZE/BANDAGES/DRESSINGS) ×3 IMPLANT
GLOVE BIO SURGEON STRL SZ7 (GLOVE) ×3 IMPLANT
GLOVE BIO SURGEON STRL SZ8 (GLOVE) ×3 IMPLANT
GLOVE BIOGEL PI IND STRL 7.0 (GLOVE) ×1 IMPLANT
GLOVE BIOGEL PI IND STRL 8 (GLOVE) ×1 IMPLANT
GLOVE BIOGEL PI INDICATOR 7.0 (GLOVE) ×2
GLOVE BIOGEL PI INDICATOR 8 (GLOVE) ×2
GOWN STRL REUS W/ TWL LRG LVL3 (GOWN DISPOSABLE) ×2 IMPLANT
GOWN STRL REUS W/ TWL XL LVL3 (GOWN DISPOSABLE) ×1 IMPLANT
GOWN STRL REUS W/TWL LRG LVL3 (GOWN DISPOSABLE) ×4
GOWN STRL REUS W/TWL XL LVL3 (GOWN DISPOSABLE) ×2
INTRAOP MONITOR FEE IMPULS NCS (MISCELLANEOUS) ×1
INTRAOP MONITOR FEE IMPULSE (MISCELLANEOUS) ×2
IV CATH 14GX2 1/4 (CATHETERS) ×3 IMPLANT
KIT BASIN OR (CUSTOM PROCEDURE TRAY) ×3 IMPLANT
KIT POSITION SURG JACKSON T1 (MISCELLANEOUS) ×3 IMPLANT
KIT TURNOVER KIT B (KITS) ×3 IMPLANT
MARKER SKIN DUAL TIP RULER LAB (MISCELLANEOUS) ×6 IMPLANT
NDL SAFETY ECLIPSE 18X1.5 (NEEDLE) ×1 IMPLANT
NEEDLE 22X1 1/2 (OR ONLY) (NEEDLE) ×6 IMPLANT
NEEDLE HYPO 18GX1.5 SHARP (NEEDLE) ×2
NEEDLE HYPO 25GX1X1/2 BEV (NEEDLE) ×3 IMPLANT
NEEDLE SPNL 18GX3.5 QUINCKE PK (NEEDLE) ×6 IMPLANT
NS IRRIG 1000ML POUR BTL (IV SOLUTION) ×6 IMPLANT
OIL CARTRIDGE MAESTRO DRILL (MISCELLANEOUS) ×3
PACK LAMINECTOMY ORTHO (CUSTOM PROCEDURE TRAY) ×3 IMPLANT
PACK UNIVERSAL I (CUSTOM PROCEDURE TRAY) ×3 IMPLANT
PAD ARMBOARD 7.5X6 YLW CONV (MISCELLANEOUS) ×6 IMPLANT
PATTIES SURGICAL .5 X1 (DISPOSABLE) ×3 IMPLANT
PATTIES SURGICAL .5X1.5 (GAUZE/BANDAGES/DRESSINGS) ×3 IMPLANT
ROD PRE BENT EXPEDIUM 35MM (Rod) ×6 IMPLANT
SCREW SET SINGLE INNER (Screw) ×12 IMPLANT
SCREW VIPER 6MMX25MM (Screw) IMPLANT
SCREW VIPER CORT FIX 6.00X30 (Screw) ×12 IMPLANT
SPONGE INTESTINAL PEANUT (DISPOSABLE) ×3 IMPLANT
SPONGE SURGIFOAM ABS GEL 100 (HEMOSTASIS) ×6 IMPLANT
STRIP CLOSURE SKIN 1/2X4 (GAUZE/BANDAGES/DRESSINGS) ×4 IMPLANT
SURGIFLO W/THROMBIN 8M KIT (HEMOSTASIS) IMPLANT
SUT MNCRL AB 4-0 PS2 18 (SUTURE) ×3 IMPLANT
SUT VIC AB 0 CT1 18XCR BRD 8 (SUTURE) ×1 IMPLANT
SUT VIC AB 0 CT1 8-18 (SUTURE) ×2
SUT VIC AB 1 CT1 18XCR BRD 8 (SUTURE) ×1 IMPLANT
SUT VIC AB 1 CT1 8-18 (SUTURE) ×2
SUT VIC AB 2-0 CT2 18 VCP726D (SUTURE) ×3 IMPLANT
SYR 20CC LL (SYRINGE) ×6 IMPLANT
SYR BULB IRRIGATION 50ML (SYRINGE) ×3 IMPLANT
SYR CONTROL 10ML LL (SYRINGE) ×6 IMPLANT
SYR TB 1ML LUER SLIP (SYRINGE) ×3 IMPLANT
TRAY FOLEY MTR SLVR 16FR STAT (SET/KITS/TRAYS/PACK) ×3 IMPLANT
YANKAUER SUCT BULB TIP NO VENT (SUCTIONS) ×3 IMPLANT

## 2018-03-24 NOTE — H&P (Deleted)
PATIENT NAME: Amy Grant  MEDICAL RECORD NO.:[id number]  PHYSICIAN: Josephina Melcher, MD DATE OF BIRTH:02/27/1951  DATE OF PROCEDURE:03/24/2018  OPERATIVE REPORT   PREOPERATIVE DIAGNOSES: 1.Right-sided lumbarradiculopathy. 2. L4-5 spinal stenosis. 3.L4/5 spondylolisthesis  POSTOPERATIVE DIAGNOSES: 1.Right-sided lumbarradiculopathy. 2. L4-5 spinal stenosis. 3.L4/5 spondylolisthesis  PROCEDURES: 1.L4/5decompression 2.Right-sided L4-5 transforaminal lumbar interbody fusion. 3.Left-sided L4-5 posterolateral fusion. 4. Insertion of interbody device x1(9mmConcorde intervertebral spacer). 5. Placement ofsegmentalposterior instrumentation L4, L5 bilaterally 6. Use of local autograft. 7. Use of morselized allograft -Vivigen 8. Intraoperative use of fluoroscopy.  SURGEON: Avalynn Bowe, MD.  ASSISTANT: Kayla McKenzie, PA-C.  ANESTHESIA: General endotracheal anesthesia.  COMPLICATIONS: None.  DISPOSITION: Stable.  ESTIMATED BLOOD LOSS:100cc  INDICATIONS FOR SURGERY: Briefly,Ms. Mende is a pleasant68-year-old female who did present to me with severe and ongoing pain in the right leg.I did feel that the symptoms were secondary to the findings noted above.  The patient failed conservative care and did wish to proceed with the procedure  noted above.  OPERATIVE DETAILS: On1/22/2020, the patient was brought to surgery and general endotracheal anesthesia was administered. The patient was placed prone on a well-padded flat Jackson bed with a spinal frame. Antibiotics were given and a time-out procedure was performed. The back was prepped and draped in the usual fashion. A midline incision was made overlying the L4-5 intervertebral spaces. The fascia was incisedatthe midline. The paraspinal musculature was bluntly swept laterally. Anatomic landmarks for the pedicles were exposed. Using  fluoroscopy, I did cannulate the bilateral L4 and L5 pedicles, using a medial to lateral cortical trajectory technique.At this point, 6 x 30 mm screws were placed into the left pedicles, and a 40 mm rod was placed into the tulip heads of the screws, and caps were also placed. Distraction was thenapplied across the L4-5 intervertebral space, and the caps were then provisionally tightened. On the right side, bone wax was placed into the cannulated pedicle holes.I then proceeded with the decompressive aspect of the procedureatthe L4-5 level.A partial facetectomy was performed on the right at L4-5, decompressing the L4-5 intervertebral space and foramen, which was noted to be very compressed. I was very pleased with the decompression. With the assistant holding medial retraction of the traversing right L5 nerve, I did perform an annulotomy at the posterolateral aspect of the L4-5 intervertebral space. I then used a series of curettes and pituitary rongeurs to perform a thorough and complete intervertebral diskectomy. The intervertebral space was then liberally packed with autograft as well as allograft in the form of Vivigen, as was the appropriate-sized intervertebral spacer (9mm). The spacer was then tamped into position in the usual fashion. I was very pleased with the press-fit of the spacer. I then placed 6 mm screws on the rightat L4 and L5.A35-mm rod was then placed and caps were placed. The distraction was then released on the contralateral left side.  All caps were then locked. The wound was copiously irrigated with a total of approximately 3 L prior to placing the bone graft. Additional autograft and allograftwasthen packed into the posterolateral gutter on the left sideto help aid in the success of the fusion. The wound was  explored for any undue bleeding and there was no substantial bleeding encountered. Gel-Foam was placed over the laminectomy site. The wound was  then closed in layers using #1 Vicryl followed by 2-0 Vicryl, followed by 4-0 Monocryl. Benzoin and Steri-Strips were applied followed by sterile dressing. A #15 blake drain was placed prior to closure.  Of note, did use triggered EMG   to test the screws on the left, and there is no screw the tested below20mA. There wasno sustained abnormal EMG activity noted throughout the surgery.  Of note, Kayla McKenzie was my assistant throughout surgery, and did aid in retraction, suctioning, and closure.    Jassen Sarver, MD 

## 2018-03-24 NOTE — H&P (Signed)
PREOPERATIVE H&P  Chief Complaint: Right leg pain  HPI: Amy Grant is a 68 y.o. female who presents with ongoing pain in the right leg  MRI reveals severe stenosis at L4/5 with instability  Patient has failed multiple forms of conservative care and continues to have pain (see office notes for additional details regarding the patient's full course of treatment)  Past Medical History:  Diagnosis Date  . Anxiety   . Arthritis    right hand, DDD L 4 , L 5  . Asthma   . Blood in stool   . Chronic intermittent post-traumatic headache   . Colon polyps   . Complication of anesthesia 2018   difficulty waking up   . Depression   . Family history of adverse reaction to anesthesia    Sister woke up during surgery  . GERD (gastroesophageal reflux disease)   . Head injury   . Headache    h/o migraines   . Hepatitis   . History of blood transfusion   . Hypertension   . Laryngopharyngeal reflux (LPR)   . Multiple falls   . Pancreatitis    remote hx w/o h/o drinking at that time  . Seizures (HCC)     after head injury  . Vitamin D deficiency    Past Surgical History:  Procedure Laterality Date  . ABDOMINAL HYSTERECTOMY  1979   has an ovary left s/p hysterectomy 1979; h/o abnormal pap   . acdf     08/07/16 Dr. Yevette Edwardsumonski   . ANTERIOR CERVICAL DECOMP/DISCECTOMY FUSION N/A 08/07/2016   Procedure: ANTERIOR CERVICAL DECOMPRESSION FUSION, CERVICAL FOUR-FIVE  WITH INSTRUMENTATION AND ALLOGRAFT;  Surgeon: Estill Bambergumonski, Marian Grandt, MD;  Location: [facility] OR;  Service: Orthopedics;  Laterality: N/A;  . APPENDECTOMY    . BREAST BIOPSY Left    over 20 years ago negative left breast  . CESAREAN SECTION    . COLON RESECTION     paritial ? reason done in [place]   . COLONOSCOPY    . EXPLORATORY LAPAROTOMY     after C Section  x 2 - 1 for bleeding and 1 for infection  . HERNIA REPAIR     Inguinal - as a child  . TONSILLECTOMY     Social History   Socioeconomic History  . Marital status:  Divorced    Spouse name: Not on file  . Number of children: Not on file  . Years of education: Not on file  . Highest education level: Not on file  Occupational History  . Not on file  Social Needs  . Financial resource strain: Not hard at all  . Food insecurity:    Worry: Never true    Inability: Never true  . Transportation needs:    Medical: No    Non-medical: No  Tobacco Use  . Smoking status: Former Smoker    Years: 30.00    Last attempt to quit: 2013    Years since quitting: 7.0  . Smokeless tobacco: Never Used  Substance and Sexual Activity  . Alcohol use: Yes    Alcohol/week: 7.0 standard drinks    Types: 7 Glasses of wine per week    Comment: per week  . Drug use: No  . Sexual activity: Not on file  Lifestyle  . Physical activity:    Days per week: 0 days    Minutes per session: Not on file  . Stress: Only a little  Relationships  . Social connections:  Talks on phone: Not on file    Gets together: Not on file    Attends religious service: Not on file    Active member of club or organization: Not on file    Attends meetings of clubs or organizations: Not on file    Relationship status: Not on file  Other Topics Concern  . Not on file  Social History Narrative   Associate degree    Wears seat belt, feels safe in marriage to Ron   1 daughter    Family History  Problem Relation Age of Onset  . Breast cancer Sister [id number]  . Cancer Sister        breast   . Diabetes Sister   . Miscarriages / Stillbirths Sister   . Cancer Mother        breast   . Heart disease Mother   . Hypertension Mother   . Miscarriages / [place]   . Heart failure Mother   . Multiple sclerosis Father   . Miscarriages / [place] Daughter   . Arthritis Maternal Grandmother   . Hyperlipidemia Maternal Grandmother   . Learning disabilities Maternal Grandmother   . Cancer Maternal Grandfather   . Diabetes Paternal Grandmother   . Arthritis Sister   . Miscarriages /  Stillbirths Sister    Allergies  Allergen Reactions  . Latex Hives  . Sulfa Antibiotics Hives  . Linaclotide Itching   Prior to Admission medications   Medication Sig Start Date End Date Taking? Authorizing Provider  acetaminophen (TYLENOL) 500 MG tablet Take 1,000 mg by mouth 2 (two) times daily.    Yes [provider]  albuterol (PROAIR HFA) 108 (90 Base) MCG/ACT inhaler Inhale 1-2 puffs into the lungs every 6 (six) hours as needed for wheezing or shortness of breath. Patient taking differently: Inhale 2 puffs into the lungs every 6 (six) hours as needed for wheezing or shortness of breath.  03/04/18  Yes McLean-Scocuzza, Pasty Spillersracy N, MD  Calcium Carbonate-Vitamin D (CALTRATE 600+D PO) Take 1 tablet by mouth 2 (two) times daily.    Yes [provider]  cyclobenzaprine (FLEXERIL) 10 MG tablet Take 10 mg by mouth daily as needed for muscle spasms.    Yes [provider]  gabapentin (NEURONTIN) 300 MG capsule TAKE ONE CAPSULE BY MOUTH FOUR TIMES DAILY Patient taking differently: Take 900 mg by mouth 2 (two) times daily.  01/07/18  Yes McLean-Scocuzza, Pasty Spillersracy N, MD  Glecaprevir-Pibrentasvir (MAVYRET) 100-40 MG TABS Take 3 tablets by mouth daily with supper.   Yes [provider]  meloxicam (MOBIC) 7.5 MG tablet Take 1 tablet (7.5 mg total) by mouth 2 (two) times daily as needed for pain. Patient taking differently: Take 7.5 mg by mouth 2 (two) times daily.  12/23/17  Yes McLean-Scocuzza, Pasty Spillersracy N, MD  Multiple Vitamin (MULTIVITAMIN WITH MINERALS) TABS tablet Take 1 tablet by mouth 2 (two) times daily.    Yes [provider]  Multiple Vitamins-Minerals (HAIR/SKIN/NAILS) TABS Take 1 tablet by mouth daily.   Yes [provider]  Omega-3 Fatty Acids (OMEGA-3 PO) Take 1 capsule by mouth 2 (two) times daily.    Yes [provider]  OVER THE COUNTER MEDICATION Take 1 tablet by mouth 2 (two) times daily. Estraval Hormone Supplement   Yes [provider]  OVER THE COUNTER MEDICATION Take 2 tablets by mouth 2 (two) times daily. Replenex Dietary Supplement   Yes [provider]  OVER THE COUNTER MEDICATION Take 1 tablet by  mouth daily. Nutradew Vision Supplement   Yes [provider]  OVER THE COUNTER MEDICATION Take 1 tablet by mouth daily. Unforgettable Cognitive Health Supplement   Yes [provider]  verapamil (CALAN-SR) 240 MG CR tablet Take 1 tablet (240 mg total) by mouth at bedtime. 11/03/17  Yes McLean-Scocuzza, Pasty Spillers, MD     All other systems have been reviewed and were otherwise negative with the exception of those mentioned in the HPI and as above.  Physical Exam: Vitals:   03/24/18 1026  BP: (!) 144/74  Pulse: 91  Resp: 20  Temp: 97.7 F (36.5 C)  SpO2: 100%    Body mass index is 22.63 kg/m.  General: Alert, no acute distress Cardiovascular: No pedal edema Respiratory: No cyanosis, no use of accessory musculature Skin: No lesions in the area of chief complaint Neurologic: Sensation intact distally Psychiatric: Patient is competent for consent with normal mood and affect Lymphatic: No axillary or cervical lymphadenopathy  MUSCULOSKELETAL: + SLR on right  Assessment/Plan: L4-L5 STENOSIS RESULTING IN SEVERE NEUROFORAMINAL COMPRESSION WITH SEVERE NERVE COMPRESSION OF THE RIGHT L4 NERVE.  THE PATIENT ALSO IS NOTED TO HAVE A GRADE 1 L4-L5 SPONDYLOLISTHESIS Plan for Procedure(s): RIGHT LUMBAR 4 - LUMBAR 5 TRANSFORAMINAL LUMBAR INTERBODY FUSION WITH INSTRUMENTATION AND ALLOGRAFT   Jackelyn Hoehn, MD 03/24/2018 3:05 PM

## 2018-03-24 NOTE — Anesthesia Preprocedure Evaluation (Signed)
Anesthesia Evaluation  Patient identified by MRN, date of birth, ID band Patient awake    Reviewed: Allergy & Precautions, NPO status , Patient's Chart, lab work & pertinent test results  History of Anesthesia Complications (+) PROLONGED EMERGENCE, Family history of anesthesia reaction and history of anesthetic complications  Airway Mallampati: II  TM Distance: >3 FB Neck ROM: Full    Dental no notable dental hx. (+) Partial Lower, Partial Upper   Pulmonary asthma , former smoker,    Pulmonary exam normal breath sounds clear to auscultation       Cardiovascular hypertension, Pt. on medications Normal cardiovascular exam Rhythm:Regular Rate:Normal  Last took verapamil 2 days ago   Neuro/Psych  Headaches, Seizures -, Well Controlled,  PSYCHIATRIC DISORDERS Anxiety Depression  Neuromuscular disease    GI/Hepatic GERD  ,(+) Hepatitis -, CCurrently undergoing treatment with Mavyret   Endo/Other  negative endocrine ROS  Renal/GU negative Renal ROS  negative genitourinary   Musculoskeletal  (+) Arthritis , Osteoarthritis,  Cervicalgia L4-5 stenosis L4 radiculopathy L4-5 spondylolisthesis   Abdominal   Peds  Hematology Hx/o blood transfusion   Anesthesia Other Findings   Reproductive/Obstetrics                             Anesthesia Physical Anesthesia Plan  ASA: II  Anesthesia Plan: General   Post-op Pain Management:    Induction: Intravenous  PONV Risk Score and Plan: 4 or greater and Ondansetron, Dexamethasone, Treatment may vary due to age or medical condition and Midazolam  Airway Management Planned: Oral ETT  Additional Equipment:   Intra-op Plan:   Post-operative Plan: Extubation in OR  Informed Consent: I have reviewed the patients History and Physical, chart, labs and discussed the procedure including the risks, benefits and alternatives for the proposed anesthesia with  the patient or authorized representative who has indicated his/her understanding and acceptance.     Dental advisory given  Plan Discussed with: CRNA and Surgeon  Anesthesia Plan Comments:         Anesthesia Quick Evaluation

## 2018-03-24 NOTE — Transfer of Care (Signed)
Immediate Anesthesia Transfer of Care Note  Patient: Amy Grant  Procedure(s) Performed: RIGHT LUMBAR 4 - LUMBAR 5 TRANSFORAMINAL LUMBAR INTERBODY FUSION WITH INSTRUMENTATION AND ALLOGRAFT (Right Back)  Patient Location: PACU  Anesthesia Type:General  Level of Consciousness: drowsy  Airway & Oxygen Therapy: Patient Spontanous Breathing and Patient connected to face mask oxygen  Post-op Assessment: Report given to RN and Post -op Vital signs reviewed and stable  Post vital signs: Reviewed and stable  Last Vitals:  Vitals Value Taken Time  BP 145/78 03/24/2018  8:56 PM  Temp 36.7 C 03/24/2018  8:55 PM  Pulse 101 03/24/2018  8:58 PM  Resp 21 03/24/2018  8:58 PM  SpO2 100 % 03/24/2018  8:58 PM  Vitals shown include unvalidated device data.  Last Pain:  Vitals:   03/24/18 1033  TempSrc:   PainSc: 3       Patients Stated Pain Goal: 2 (03/24/18 1033)  Complications: No apparent anesthesia complications

## 2018-03-24 NOTE — Op Note (Signed)
PATIENT NAME: Amy Grant  MEDICAL RECORD SV.:[id number]  PHYSICIAN: Estill Bamberg, MD DATE OF BIRTH:[birth year]  DATE OF PROCEDURE:03/24/2018  OPERATIVE REPORT   PREOPERATIVE DIAGNOSES: 1.Right-sided lumbarradiculopathy. 2. L4-5 spinal stenosis. 3.L4/5 spondylolisthesis  POSTOPERATIVE DIAGNOSES: 1.Right-sided lumbarradiculopathy. 2. L4-5 spinal stenosis. 3.L4/5 spondylolisthesis  PROCEDURES: 1.L4/5decompression 2.Right-sided L4-5 transforaminal lumbar interbody fusion. 3.Left-sided L4-5 posterolateral fusion. 4. Insertion of interbody device x1(39mmConcorde intervertebral spacer). 5. Placement ofsegmentalposterior instrumentation L4, L5 bilaterally 6. Use of local autograft. 7. Use of morselized allograft -Vivigen 8. Intraoperative use of fluoroscopy.  SURGEON: Estill Bamberg, MD.  ASSISTANTJason Coop, PA-C.  ANESTHESIA: General endotracheal anesthesia.  COMPLICATIONS: None.  DISPOSITION: Stable.  ESTIMATED BLOOD LOSS:100cc  INDICATIONS FOR SURGERY: Briefly,Amy Grant is a pleasant8 year old female who did present to me with severe and ongoing pain in the right leg.I did feel that the symptoms were secondary to the findings noted above.  The patient failed conservative care and did wish to proceed with the procedure  noted above.  OPERATIVE DETAILS: On1/22/2020, the patient was brought to surgery and general endotracheal anesthesia was administered. The patient was placed prone on a well-padded flat Jackson bed with a spinal frame. Antibiotics were given and a time-out procedure was performed. The back was prepped and draped in the usual fashion. A midline incision was made overlying the L4-5 intervertebral spaces. The fascia was incisedatthe midline. The paraspinal musculature was bluntly swept laterally. Anatomic landmarks for the pedicles were exposed. Using  fluoroscopy, I did cannulate the bilateral L4 and L5 pedicles, using a medial to lateral cortical trajectory technique.At this point, 6 x 30 mm screws were placed into the left pedicles, and a 40 mm rod was placed into the tulip heads of the screws, and caps were also placed. Distraction was thenapplied across the L4-5 intervertebral space, and the caps were then provisionally tightened. On the right side, bone wax was placed into the cannulated pedicle holes.I then proceeded with the decompressive aspect of the procedureatthe L4-5 level.A partial facetectomy was performed on the right at L4-5, decompressing the L4-5 intervertebral space and foramen, which was noted to be very compressed. I was very pleased with the decompression. With the assistant holding medial retraction of the traversing right L5 nerve, I did perform an annulotomy at the posterolateral aspect of the L4-5 intervertebral space. I then used a series of curettes and pituitary rongeurs to perform a thorough and complete intervertebral diskectomy. The intervertebral space was then liberally packed with autograft as well as allograft in the form of Vivigen, as was the appropriate-sized intervertebral spacer (29mm). The spacer was then tamped into position in the usual fashion. I was very pleased with the press-fit of the spacer. I then placed 6 mm screws on the rightat L4 and L5.A35-mm rod was then placed and caps were placed. The distraction was then released on the contralateral left side.  All caps were then locked. The wound was copiously irrigated with a total of approximately 3 L prior to placing the bone graft. Additional autograft and allograftwasthen packed into the posterolateral gutter on the left sideto help aid in the success of the fusion. The wound was  explored for any undue bleeding and there was no substantial bleeding encountered. Gel-Foam was placed over the laminectomy site. The wound was  then closed in layers using #1 Vicryl followed by 2-0 Vicryl, followed by 4-0 Monocryl. Benzoin and Steri-Strips were applied followed by sterile dressing. A #15 blake drain was placed prior to closure.  Of note, did use triggered EMG  to test the screws on the left, and there is no screw the tested below20mA. There wasno sustai33ned abnormal EMG activity noted throughout the surgery.  Of note, Jason CoopKayla McKenzie was my assistant throughout surgery, and did aid in retraction, suctioning, and closure.    Estill BambergMark Beauty Pless, MD

## 2018-03-24 NOTE — Anesthesia Procedure Notes (Signed)
Procedure Name: Intubation Performed by: Ezekiel Ina, CRNA Pre-anesthesia Checklist: Patient identified, Emergency Drugs available, Suction available and Patient being monitored Patient Re-evaluated:Patient Re-evaluated prior to induction Oxygen Delivery Method: Circle System Utilized Preoxygenation: Pre-oxygenation with 100% oxygen Induction Type: IV induction Ventilation: Mask ventilation without difficulty Laryngoscope Size: Miller and 3 Grade View: Grade I Tube type: Oral Tube size: 7.0 mm Number of attempts: 1 Airway Equipment and Method: Stylet and Oral airway Placement Confirmation: ETT inserted through vocal cords under direct vision,  positive ETCO2 and breath sounds checked- equal and bilateral Secured at: 21 cm Tube secured with: Tape Dental Injury: Teeth and Oropharynx as per pre-operative assessment

## 2018-03-25 ENCOUNTER — Encounter (HOSPITAL_COMMUNITY): Payer: Self-pay | Admitting: Orthopedic Surgery

## 2018-03-25 MED FILL — Thrombin (Recombinant) For Soln 20000 Unit: CUTANEOUS | Qty: 1 | Status: AC

## 2018-03-25 NOTE — Progress Notes (Signed)
Patient is discharged from room 3C04 at this time. Alert and in stable condition. IV sie d/c'd and instructions read to patient and spouse with understanding verbalized. Left unit via wheelchair with all belongings at side.

## 2018-03-25 NOTE — Progress Notes (Signed)
    Patient doing well  Patient reports some residual right thigh discomfort Has been ambulating   Physical Exam: Vitals:   03/24/18 2336 03/25/18 0518  BP: 131/68 131/71  Pulse: 83 75  Resp: 18 18  Temp: 97.7 F (36.5 C) 97.6 F (36.4 C)  SpO2: 100% 100%    Dressing in place NVI  Drain output: 160/12  hours  POD #1 s/p L4/5 decompression and fusion, doing well  - up with PT/OT, encourage ambulation - Percocet for pain, Valium for muscle spasms - likely d/c home today with f/u in 2 weeks - will follow-up on drain out output later today - will likely d/c prior to discharge

## 2018-03-25 NOTE — Progress Notes (Signed)
Orthopedic Tech Progress Note Patient Details:  Amy Grant September 19, 1950 [id number] Called in order  Patient ID: Amy Grant, female   DOB: 1950/07/18, 68 y.o.   MRN: [id number]   Donald Pore 03/25/2018, 8:41 AM

## 2018-03-25 NOTE — Evaluation (Signed)
Occupational Therapy Evaluation Patient Details Name: Amy Grant MRN: [id number] DOB: [birth year] Today's Date: 03/25/2018    History of Present Illness Pt is a 68 y/o female s/p L4/5 decompression and fusion.    Clinical Impression   PTA Pt independent in ADL and mobility. Lives with fiance who is available 24/7 and they have appropriate DME. Pt is currently able to perform figure 4 for LB dressing/bathing, mod A to don brace EOB, min A hand held assist for transfers. Back handout provided and reviewed adls in detail. Pt educated on: clothing between brace, never sleep in brace, set an alarm at night for medication, avoid sitting for long periods of time, correct bed positioning for sleeping, correct sequence for bed mobility, AE (grabber/reacher, and long handle sponge), avoiding lifting more than 5 pounds and never wash directly over incision. All education is complete and patient along with fiance indicates understanding. OT to sign off at this time. Thank you for the opportunity to serve this patient.     Follow Up Recommendations  No OT follow up;Supervision/Assistance - 24 hour(initially)    Equipment Recommendations  None recommended by OT(Pt has appropriate DME; AE (reacher, long handle sponge))    Recommendations for Other Services       Precautions / Restrictions Precautions Precautions: Back Precaution Booklet Issued: Yes (comment) Precaution Comments: back handout reviewed in full Required Braces or Orthoses: Spinal Brace Spinal Brace: Thoracolumbosacral orthotic;Applied in sitting position Restrictions Weight Bearing Restrictions: No      Mobility Bed Mobility Overal bed mobility: Needs Assistance Bed Mobility: Sidelying to Sit   Sidelying to sit: Min guard;HOB elevated(slightly elevated)       General bed mobility comments: reviewed log rolling technique and positioning for pillows for comfort  Transfers Overall transfer level: Needs assistance Equipment  used: 1 person hand held assist Transfers: Sit to/from Stand Sit to Stand: Min assist         General transfer comment: min A for boost and balance    Balance Overall balance assessment: Needs assistance Sitting-balance support: Single extremity supported;No upper extremity supported;Feet supported Sitting balance-Leahy Scale: Good Sitting balance - Comments: EOB   Standing balance support: Single extremity supported;Bilateral upper extremity supported Standing balance-Leahy Scale: Poor Standing balance comment: reaches for external assist, requires support                           ADL either performed or assessed with clinical judgement   ADL Overall ADL's : Needs assistance/impaired Eating/Feeding: Independent;Sitting   Grooming: Wash/dry face;Wash/dry hands;Min guard;Standing;With caregiver independent assisting;Cueing for compensatory techniques Grooming Details (indicate cue type and reason): educated in compensatory strategies for sink level grooming Upper Body Bathing: Minimal assistance;With caregiver independent assisting Upper Body Bathing Details (indicate cue type and reason): educated in long handle sponge Lower Body Bathing: Min guard;Sit to/from stand Lower Body Bathing Details (indicate cue type and reason): educated in long handle sponge Upper Body Dressing : Moderate assistance;With caregiver independent assisting;Sitting Upper Body Dressing Details (indicate cue type and reason): to don brace, Pt and fiance re-educated in sequence/proper donning Lower Body Dressing: Minimal assistance;Sit to/from stand Lower Body Dressing Details (indicate cue type and reason): able to perform figure 4 dressing technique, min A for transition for sit <>stand, Educated on Nutritional therapists[employer] clothing choices at home Toilet Transfer: Minimal assistance;Ambulation Toilet Transfer Details (indicate cue type and reason): HHA for ambulation, use of arm rests to assist with  transfers Toileting- Clothing Manipulation and  Hygiene: Supervision/safety;Sitting/lateral lean       Functional mobility during ADLs: Minimal assistance(1 person HHA - recommended use of RW with PT) General ADL Comments: husband present throughout session for education, reviewed handout and compensatory strategies in full     Vision Baseline Vision/History: Wears glasses Wears Glasses: Reading only Patient Visual Report: No change from baseline       Perception     Praxis      Pertinent Vitals/Pain Pain Assessment: Faces Faces Pain Scale: Hurts even more Pain Location: incision site and R thigh Pain Descriptors / Indicators: Burning;Discomfort;Grimacing;Sharp Pain Intervention(s): Monitored during session;Repositioned     Hand Dominance Right   Extremity/Trunk Assessment Upper Extremity Assessment Upper Extremity Assessment: Overall WFL for tasks assessed   Lower Extremity Assessment Lower Extremity Assessment: RLE deficits/detail;Defer to PT evaluation RLE Deficits / Details: pain in thigh   Cervical / Trunk Assessment Cervical / Trunk Assessment: Other exceptions Cervical / Trunk Exceptions: s/p sx   Communication Communication Communication: No difficulties   Cognition Arousal/Alertness: Suspect due to medications(eyes closing frequently throughout session) Behavior During Therapy: WFL for tasks assessed/performed Overall Cognitive Status: Within Functional Limits for tasks assessed                                 General Comments: Pt sleepy due to medications? but able to recall precautions and verbalize understanding of compensatory strategies   General Comments  fiance present throughout session, able to assist with donning brace, attentive and helpful    Exercises     Shoulder Instructions      Home Living Family/patient expects to be discharged to:: Private residence Living Arrangements: Spouse/significant other Available Help at  Discharge: Family;Available 24 hours/day Type of Home: House Home Access: Level entry     Home Layout: Two level;Bed/bath upstairs;Full bath on main level(plans on sleeping downstairs initially) Alternate Level Stairs-Number of Steps: flight Alternate Level Stairs-Rails: Right;Left;Can reach both Bathroom Shower/Tub: Chief Strategy Officer: Handicapped height Bathroom Accessibility: Yes How Accessible: Accessible via walker Home Equipment: Walker - 2 wheels;Bedside commode;Shower seat;Grab bars - tub/shower;Hand held shower head          Prior Functioning/Environment Level of Independence: Independent                 OT Problem List: Decreased activity tolerance;Impaired balance (sitting and/or standing);Decreased knowledge of use of DME or AE;Pain;Decreased knowledge of precautions      OT Treatment/Interventions:      OT Goals(Current goals can be found in the care plan section) Acute Rehab OT Goals Patient Stated Goal: get home OT Goal Formulation: With patient/family Time For Goal Achievement: 04/02/18 Potential to Achieve Goals: Good  OT Frequency:     Barriers to D/C:            Co-evaluation              AM-PAC OT "6 Clicks" Daily Activity     Outcome Measure Help from another person eating meals?: None Help from another person taking care of personal grooming?: A Little Help from another person toileting, which includes using toliet, bedpan, or urinal?: A Little Help from another person bathing (including washing, rinsing, drying)?: A Little Help from another person to put on and taking off regular upper body clothing?: A Lot Help from another person to put on and taking off regular lower body clothing?: A Little 6 Click Score: 18  End of Session Equipment Utilized During Treatment: Gait belt;Back brace Nurse Communication: Mobility status;Precautions  Activity Tolerance: Patient tolerated treatment well Patient left: in chair;with  call bell/phone within reach;with family/visitor present  OT Visit Diagnosis: Unsteadiness on feet (R26.81);Other abnormalities of gait and mobility (R26.89);Pain Pain - Right/Left: Right Pain - part of body: Leg(and back)                Time: [id number] OT Time Calculation (min): 31 min Charges:  OT General Charges $OT Visit: 1 Visit OT Evaluation $OT Eval Moderate Complexity: 1 Mod OT Treatments $Self Care/Home Management : 8-22 mins  Sherryl MangesLaura Zen Felling OTR/L Acute Rehabilitation Services Pager: [phone] Office: [phone]  Evern BioLaura J Brigg [place] 03/25/2018, 10:00 AM

## 2018-03-25 NOTE — Evaluation (Signed)
Physical Therapy Evaluation Patient Details Name: Amy Grant MRN: [id number] DOB: 12/06/1950 Today's Date: 03/25/2018   History of Present Illness  Pt is a 68 y/o female s/p L4/5 decompression and fusion.   Clinical Impression  Pt admitted with above diagnosis. Pt currently with functional limitations due to the deficits listed below (see PT Problem List). At the time of PT eval pt was able to perform transfers and ambulation with gross min guard assist to min assist for balance support and safety with RW. Pt appears safer and ore steady with RW. Pt and fiance' were educated on precautions, brace application/wearing schedule/adjustments, car transfer, stair negotiation, safe activity progression, and positioning recommendations. Pt will benefit from skilled PT to increase their independence and safety with mobility to allow discharge to the venue listed below.       Follow Up Recommendations No PT follow up;Supervision for mobility/OOB    Equipment Recommendations  None recommended by PT    Recommendations for Other Services       Precautions / Restrictions Precautions Precautions: Back Precaution Booklet Issued: Yes (comment) Precaution Comments: back handout reviewed in full Required Braces or Orthoses: Spinal Brace Spinal Brace: Thoracolumbosacral orthotic;Applied in sitting position Restrictions Weight Bearing Restrictions: No      Mobility  Bed Mobility Overal bed mobility: Needs Assistance Bed Mobility: Sidelying to Sit;Sit to Sidelying   Sidelying to sit: HOB elevated;Min guard(slightly elevated)     Sit to sidelying: Min assist General bed mobility comments: Assist to elevate LE's up into bed. reviewed log rolling technique and positioning for pillows for comfort  Transfers Overall transfer level: Needs assistance Equipment used: Rolling walker (2 wheeled) Transfers: Sit to/from Stand Sit to Stand: Min guard         General transfer comment: Close guard  for safety with RW. VC's for hand placement on seated surface for safety.   Ambulation/Gait Ambulation/Gait assistance: Min guard Gait Distance (Feet): 250 Feet Assistive device: Rolling walker (2 wheeled) Gait Pattern/deviations: Step-through pattern;Decreased stride length;Trunk flexed Gait velocity: Decreased Gait velocity interpretation: 1.31 - 2.62 ft/sec, indicative of limited community ambulator General Gait Details: VC's for improved posture, forward gaze, and closer walker proximity. Slow and slightly unsteady even with RW - hands-on guarding for support.   Stairs Stairs: Yes Stairs assistance: Min assist Stair Management: One rail Left;Step to pattern;Forwards Number of Stairs: 10 General stair comments: Assist for balance support with power-up to next stair. VC's for sequencing and general safety.   Wheelchair Mobility    Modified Rankin (Stroke Patients Only)       Balance Overall balance assessment: Needs assistance Sitting-balance support: Single extremity supported;No upper extremity supported;Feet supported Sitting balance-Leahy Scale: Good Sitting balance - Comments: EOB   Standing balance support: Single extremity supported;Bilateral upper extremity supported Standing balance-Leahy Scale: Poor Standing balance comment: reaches for external assist, requires support                             Pertinent Vitals/Pain Pain Assessment: Faces Faces Pain Scale: Hurts even more Pain Location: incision site and R thigh Pain Descriptors / Indicators: Burning;Discomfort;Grimacing;Sharp Pain Intervention(s): Monitored during session    Home Living Family/patient expects to be discharged to:: Private residence Living Arrangements: Spouse/significant other Available Help at Discharge: Family;Available 24 hours/day Type of Home: House Home Access: Level entry     Home Layout: Two level;Bed/bath upstairs;Full bath on main level(plans on sleeping  downstairs initially) Home Equipment: Dan Humphreys -  2 wheels;Bedside commode;Shower seat;Grab bars - tub/shower;Hand held shower head      Prior Function Level of Independence: Independent               Hand Dominance   Dominant Hand: Right    Extremity/Trunk Assessment   Upper Extremity Assessment Upper Extremity Assessment: Overall WFL for tasks assessed    Lower Extremity Assessment Lower Extremity Assessment: RLE deficits/detail RLE Deficits / Details: pain in thigh - decreased strength and muscular endurance consistent with pre-op diagnosis    Cervical / Trunk Assessment Cervical / Trunk Assessment: Other exceptions Cervical / Trunk Exceptions: s/p sx  Communication   Communication: No difficulties  Cognition Arousal/Alertness: Suspect due to medications(eyes closing frequently throughout session) Behavior During Therapy: WFL for tasks assessed/performed Overall Cognitive Status: Within Functional Limits for tasks assessed                                 General Comments: Pt sleepy due to medications? but able to recall precautions and verbalize understanding of compensatory strategies      General Comments General comments (skin integrity, edema, etc.): fiance present throughout session, able to assist with donning brace, attentive and helpful    Exercises     Assessment/Plan    PT Assessment Patient needs continued PT services  PT Problem List Decreased strength;Decreased activity tolerance;Decreased balance;Decreased mobility;Decreased knowledge of use of DME;Decreased safety awareness;Decreased knowledge of precautions;Pain       PT Treatment Interventions DME instruction;Gait training;Stair training;Functional mobility training;Therapeutic activities;Therapeutic exercise;Neuromuscular re-education;Patient/family education    PT Goals (Current goals can be found in the Care Plan section)  Acute Rehab PT Goals Patient Stated Goal: get  home PT Goal Formulation: With patient/family Time For Goal Achievement: 04/01/18 Potential to Achieve Goals: Good    Frequency Min 5X/week   Barriers to discharge        Co-evaluation               AM-PAC PT "6 Clicks" Mobility  Outcome Measure Help needed turning from your back to your side while in a flat bed without using bedrails?: None Help needed moving from lying on your back to sitting on the side of a flat bed without using bedrails?: A Little Help needed moving to and from a bed to a chair (including a wheelchair)?: A Little Help needed standing up from a chair using your arms (e.g., wheelchair or bedside chair)?: A Little Help needed to walk in hospital room?: A Little Help needed climbing 3-5 steps with a railing? : A Little 6 Click Score: 19    End of Session Equipment Utilized During Treatment: Gait belt;Back brace Activity Tolerance: Patient limited by lethargy;Patient limited by fatigue Patient left: in bed;with call bell/phone within reach;with family/visitor present Nurse Communication: Mobility status PT Visit Diagnosis: Unsteadiness on feet (R26.81);Pain;Other symptoms and signs involving the nervous system (R29.898) Pain - Right/Left: Right Pain - part of body: Leg(incision site on back)    Time: [id number] PT Time Calculation (min) (ACUTE ONLY): 37 min   Charges:   PT Evaluation $PT Eval Moderate Complexity: 1 Mod PT Treatments $Gait Training: 8-22 mins        Conni Slipper, PT, DPT Acute Rehabilitation Services Pager: [phone] Office: [phone]   Marylynn Pearson 03/25/2018, 12:22 PM

## 2018-03-27 NOTE — Anesthesia Postprocedure Evaluation (Signed)
Anesthesia Post Note  Patient: Amy Grant  Procedure(s) Performed: RIGHT LUMBAR 4 - LUMBAR 5 TRANSFORAMINAL LUMBAR INTERBODY FUSION WITH INSTRUMENTATION AND ALLOGRAFT (Right Back)     Patient location during evaluation: PACU Anesthesia Type: General Level of consciousness: awake and alert Pain management: pain level controlled Vital Signs Assessment: post-procedure vital signs reviewed and stable Respiratory status: spontaneous breathing, nonlabored ventilation, respiratory function stable and patient connected to nasal cannula oxygen Cardiovascular status: blood pressure returned to baseline and stable Postop Assessment: no apparent nausea or vomiting Anesthetic complications: no    Last Vitals:  Vitals:   03/25/18 0741 03/25/18 1203  BP: 105/70 140/84  Pulse: 79 85  Resp: 19 18  Temp: (!) 36.4 C 36.5 C  SpO2: 99% 100%    Last Pain:  Vitals:   03/25/18 1203  TempSrc: Oral  PainSc:                  [name]

## 2018-03-30 MED FILL — Heparin Sodium (Porcine) Inj 1000 Unit/ML: INTRAMUSCULAR | Qty: 30 | Status: AC

## 2018-03-30 MED FILL — Sodium Chloride IV Soln 0.9%: INTRAVENOUS | Qty: 1000 | Status: AC

## 2018-03-31 NOTE — Discharge Summary (Signed)
Patient ID: Amy Grant MRN: [id number] DOB/AGE: [birth year] 68 y.o.  Admit date: 03/24/2018 Discharge date: 03/25/2018  Admission Diagnoses:  Active Problems:   Radiculopathy   Discharge Diagnoses:  Same  Past Medical History:  Diagnosis Date  . Anxiety   . Arthritis    right hand, DDD L 4 , L 5  . Asthma   . Blood in stool   . Chronic intermittent post-traumatic headache   . Colon polyps   . Complication of anesthesia 2018   difficulty waking up   . Depression   . Family history of adverse reaction to anesthesia    Sister woke up during surgery  . GERD (gastroesophageal reflux disease)   . Head injury   . Headache    h/o migraines   . Hepatitis   . History of blood transfusion   . Hypertension   . Laryngopharyngeal reflux (LPR)   . Multiple falls   . Pancreatitis    remote hx w/o h/o drinking at that time  . Seizures (HCC)     after head injury  . Vitamin D deficiency     Surgeries: Procedure(s): RIGHT LUMBAR 4 - LUMBAR 5 TRANSFORAMINAL LUMBAR INTERBODY FUSION WITH INSTRUMENTATION AND ALLOGRAFT on 03/24/2018   Consultants: None  Discharged Condition: Improved  Hospital Course: Amy Grant is an 68 y.o. female who was admitted 03/24/2018 for operative treatment of radiculopathy. Patient has severe unremitting pain that affects sleep, daily activities, and work/hobbies. After pre-op clearance the patient was taken to the operating room on 03/24/2018 and underwent  Procedure(s): RIGHT LUMBAR 4 - LUMBAR 5 TRANSFORAMINAL LUMBAR INTERBODY FUSION WITH INSTRUMENTATION AND ALLOGRAFT.    Patient was given perioperative antibiotics:  Anti-infectives (From admission, onward)   Start     Dose/Rate Route Frequency Ordered Stop   03/25/18 1700  Glecaprevir-Pibrentasvir 100-40 MG TABS 3 tablet  Status:  Discontinued     3 tablet Oral Daily with supper 03/24/18 2230 03/25/18 1653   03/24/18 2300  ceFAZolin (ANCEF) IVPB 2g/100 mL premix     2 g 200 mL/hr over 30  Minutes Intravenous Every 8 hours 03/24/18 2230 03/25/18 0520   03/24/18 1030  ceFAZolin (ANCEF) IVPB 2g/100 mL premix     2 g 200 mL/hr over 30 Minutes Intravenous On call to O.R. 03/24/18 1016 03/24/18 1713   03/24/18 1025  ceFAZolin (ANCEF) 2-4 GM/100ML-% IVPB    Note to Pharmacy:  Clydene Laming   : cabinet override      03/24/18 1025 03/24/18 1713       Patient was given sequential compression devices, early ambulation to prevent DVT.  Patient benefited maximally from hospital stay and there were no complications.    Recent vital signs: BP 140/84 (BP Location: Right Arm)   Pulse 85   Temp 97.7 F (36.5 C) (Oral)   Resp 18   Ht 5\' 5"  (1.651 m)   Wt 61.7 kg   SpO2 100%   BMI 22.63 kg/m    Discharge Medications:   Allergies as of 03/25/2018      Reactions   Latex Hives   Sulfa Antibiotics Hives   Linaclotide Itching      Medication List    STOP taking these medications   acetaminophen 500 MG tablet Commonly known as:  TYLENOL     TAKE these medications   albuterol 108 (90 Base) MCG/ACT inhaler Commonly known as:  PROAIR HFA Inhale 1-2 puffs into the lungs every 6 (six) hours as needed  for wheezing or shortness of breath. What changed:  how much to take   CALTRATE 600+D PO Take 1 tablet by mouth 2 (two) times daily.   gabapentin 300 MG capsule Commonly known as:  NEURONTIN TAKE ONE CAPSULE BY MOUTH FOUR TIMES DAILY What changed:    how much to take  when to take this   HAIR/SKIN/NAILS Tabs Take 1 tablet by mouth daily.   MAVYRET 100-40 MG Tabs Generic drug:  Glecaprevir-Pibrentasvir Take 3 tablets by mouth daily with supper.   multivitamin with minerals Tabs tablet Take 1 tablet by mouth 2 (two) times daily.   OVER THE COUNTER MEDICATION Take 1 tablet by mouth 2 (two) times daily. Estraval Hormone Supplement   OVER THE COUNTER MEDICATION Take 2 tablets by mouth 2 (two) times daily. Replenex Dietary Supplement   OVER THE COUNTER  MEDICATION Take 1 tablet by mouth daily. Nutradew Vision Supplement   OVER THE COUNTER MEDICATION Take 1 tablet by mouth daily. Unforgettable Cognitive Health Supplement   verapamil 240 MG CR tablet Commonly known as:  CALAN-SR Take 1 tablet (240 mg total) by mouth at bedtime.       Diagnostic Studies: Dg Lumbar Spine 2-3 Views  Result Date: 03/24/2018 CLINICAL DATA:  Surgery at L4-5. FLUOROSCOPY TIME:  45 seconds. Images: 2 EXAM: LUMBAR SPINE - 2-3 VIEW COMPARISON:  None. FINDINGS: Pedicle rods and screws been placed at L4 and L5. A disc spacer device is seen at the same level. IMPRESSION: Surgical hardware placed at L4 and L5 as above. Electronically Signed   By: Gerome Samavid  Williams III M.D   On: 03/24/2018 21:15   Dg Lumbar Spine 1 View  Result Date: 03/24/2018 CLINICAL DATA:  Localization film for surgery EXAM: LUMBAR SPINE - 1 VIEW COMPARISON:  MRI January 26, 2018 FINDINGS: Localizing needles are seen posteriorly at L4 and L5. Anterolisthesis, grade 1, of L4 versus L5 is stable. No other interval changes. Lower lumbar facet degenerative changes. Degenerative disc disease at L5-S1. IMPRESSION: Localizing needles at L4 and L5 as above. Electronically Signed   By: Gerome Samavid  Williams III M.D   On: 03/24/2018 19:00   Dg C-arm 1-60 Min  Result Date: 03/24/2018 CLINICAL DATA:  Surgery at L4-5. FLUOROSCOPY TIME:  45 seconds. Images: 2 EXAM: LUMBAR SPINE - 2-3 VIEW COMPARISON:  None. FINDINGS: Pedicle rods and screws been placed at L4 and L5. A disc spacer device is seen at the same level. IMPRESSION: Surgical hardware placed at L4 and L5 as above. Electronically Signed   By: Gerome Samavid  Williams III M.D   On: 03/24/2018 21:16    Disposition:    POD #1 s/p L4/5 decompression and fusion, doing well  - up with PT/OT, encourage ambulation - Percocet for pain, Valium for muscle spasms -Written scripts for pain signed and in chart -D/C instructions sheet printed and in chart -D/C today  -F/U in  office 2 weeks   Signed: Eilene GhaziKayla J Priseis Cratty 03/31/2018, 12:00 PM

## 2018-04-07 ENCOUNTER — Other Ambulatory Visit: Payer: Self-pay | Admitting: Orthopedic Surgery

## 2018-04-07 ENCOUNTER — Ambulatory Visit
Admission: RE | Admit: 2018-04-07 | Discharge: 2018-04-07 | Disposition: A | Discharge status: Home / Self Care | Payer: Medicare Other | Source: Ambulatory Visit | Attending: Orthopedic Surgery | Admitting: Orthopedic Surgery

## 2018-04-07 DIAGNOSIS — M79604 Pain in right leg: Secondary | ICD-10-CM

## 2018-04-07 DIAGNOSIS — M7989 Other specified soft tissue disorders: Secondary | ICD-10-CM | POA: Diagnosis not present

## 2018-04-07 DIAGNOSIS — R609 Edema, unspecified: Secondary | ICD-10-CM

## 2018-04-07 DIAGNOSIS — Z9889 Other specified postprocedural states: Secondary | ICD-10-CM | POA: Diagnosis not present

## 2018-05-07 DIAGNOSIS — Z9889 Other specified postprocedural states: Secondary | ICD-10-CM | POA: Diagnosis not present

## 2018-05-07 DIAGNOSIS — M48061 Spinal stenosis, lumbar region without neurogenic claudication: Secondary | ICD-10-CM | POA: Diagnosis not present

## 2018-05-19 ENCOUNTER — Ambulatory Visit: Payer: Medicare Other | Admitting: Internal Medicine

## 2018-06-14 DIAGNOSIS — Z9889 Other specified postprocedural states: Secondary | ICD-10-CM | POA: Diagnosis not present

## 2018-06-17 DIAGNOSIS — M4326 Fusion of spine, lumbar region: Secondary | ICD-10-CM | POA: Diagnosis not present

## 2018-06-18 ENCOUNTER — Other Ambulatory Visit: Payer: Self-pay | Admitting: Internal Medicine

## 2018-06-18 DIAGNOSIS — M5416 Radiculopathy, lumbar region: Secondary | ICD-10-CM

## 2018-06-18 MED ORDER — GABAPENTIN 300 MG PO CAPS: 300.0000 mg | ORAL_CAPSULE | Freq: Four times a day (QID) | ORAL | 5 refills | Status: DC

## 2018-06-21 DIAGNOSIS — M4326 Fusion of spine, lumbar region: Secondary | ICD-10-CM | POA: Diagnosis not present

## 2018-06-24 DIAGNOSIS — M4326 Fusion of spine, lumbar region: Secondary | ICD-10-CM | POA: Diagnosis not present

## 2018-06-25 ENCOUNTER — Ambulatory Visit: Payer: Medicare Other | Admitting: Internal Medicine

## 2018-06-25 ENCOUNTER — Telehealth: Payer: Self-pay | Admitting: Internal Medicine

## 2018-06-25 DIAGNOSIS — E2839 Other primary ovarian failure: Secondary | ICD-10-CM

## 2018-06-25 DIAGNOSIS — Z1231 Encounter for screening mammogram for malignant neoplasm of breast: Secondary | ICD-10-CM

## 2018-06-25 DIAGNOSIS — B182 Chronic viral hepatitis C: Secondary | ICD-10-CM

## 2018-06-25 DIAGNOSIS — I10 Essential (primary) hypertension: Secondary | ICD-10-CM

## 2018-06-25 DIAGNOSIS — M5416 Radiculopathy, lumbar region: Secondary | ICD-10-CM

## 2018-06-25 DIAGNOSIS — M8580 Other specified disorders of bone density and structure, unspecified site: Secondary | ICD-10-CM

## 2018-06-25 MED ORDER — VERAPAMIL HCL ER 240 MG PO TBCR: 240.0000 mg | EXTENDED_RELEASE_TABLET | Freq: Every day | ORAL | 3 refills | Status: DC

## 2018-06-25 MED ORDER — CYCLOBENZAPRINE HCL 10 MG PO TABS: 10.0000 mg | ORAL_TABLET | Freq: Two times a day (BID) | ORAL | Status: DC | PRN

## 2018-06-25 NOTE — Telephone Encounter (Signed)
-----   Message from Midge Minium, MD sent at 06/25/2018  4:50 PM EDT ----- Regarding: RE: Hi, At the start of treatment we tell the patients that cure is determined by labs done at 1 year. We usually check labs at 6 months to make sure everything is going well. My office contacts the patient for the labs 1 year after the treatment was completed. That is what we cal SVR. Sustained vita response.   Thanks.  Darren ----- Message ----- From: McLean-Scocuzza, Pasty Spillers, MD Sent: 06/25/2018   2:54 PM EDT To: Midge Minium, MD  Hi pt completed treatment with Mavyret for hep C   Are there any follow up labs you would like me to order besides hep C viral load, CMET?  She was asking but I dont see f/u with GI?  Do you want to do the labs or me?    Thanks & be safe TMS

## 2018-06-25 NOTE — Telephone Encounter (Signed)
Per GI Dr. Kristeen Mans  At the start of treatment we tell the patients that cure is determined by labs done at 1 year. She needs to make sure she calls and has a f/u appt for labs and f/u GI appt for future Dr. Vilma Prader office [phone]  GI said they usually check labs at 6 months to make sure everything is going well. GI office will contact the patient for the labs 1 year after the treatment was completed.   The GI office should contact Amy Grant about scheduling labs based on the above    Inform pt ans she what she says.   Thanks [employer]

## 2018-06-25 NOTE — Progress Notes (Signed)
Virtual Visit via Video Note via Doxy failed changed to telephone note   I connected with Amy Grant   on 06/25/18 at  2:37 PM EDT by telephone and verified that I am speaking with the correct person using two identifiers.  Location patient: home Location provider:work  Persons participating in the virtual visit: patient, provider. pts husband Ron   I discussed the limitations of evaluation and management by telemedicine and the availability of in person appointments. The patient expressed understanding and agreed to proceed.   HPI: 1. Hep C completed Mavyret and wants to know if labs due for f/u will CC GI Dr. Servando Snare for lab info likely needs CMET, hep C quant will see if other labs needed  2.lumbar radiculopathy s/p L4/5 decomp. And fusion healing from 03/24/2018 with Dr. Yevette Edwards doing well last PT session next week. Back pain improved on prn flexeril and gabapentin 300 qid  3. Denies blood in stool  4. HTN not checking at home but takes verapamil 240 mg CR labs had 03/18/2018   ROS: See pertinent positives and negatives per HPI.  Past Medical History:  Diagnosis Date  . Anxiety   . Arthritis    right hand, DDD L 4 , L 5  . Asthma   . Blood in stool   . Chronic intermittent post-traumatic headache   . Colon polyps   . Complication of anesthesia 2018   difficulty waking up   . Depression   . Family history of adverse reaction to anesthesia    Sister woke up during surgery  . GERD (gastroesophageal reflux disease)   . Head injury   . Headache    h/o migraines   . Hepatitis   . History of blood transfusion   . Hypertension   . Laryngopharyngeal reflux (LPR)   . Multiple falls   . Pancreatitis    remote hx w/o h/o drinking at that time  . Seizures (HCC)     after head injury  . Vitamin D deficiency     Past Surgical History:  Procedure Laterality Date  . ABDOMINAL HYSTERECTOMY  1979   has an ovary left s/p hysterectomy 1979; h/o abnormal pap   . acdf     08/07/16 Dr.  Yevette Edwards   . ANTERIOR CERVICAL DECOMP/DISCECTOMY FUSION N/A 08/07/2016   Procedure: ANTERIOR CERVICAL DECOMPRESSION FUSION, CERVICAL FOUR-FIVE  WITH INSTRUMENTATION AND ALLOGRAFT;  Surgeon: Estill Bamberg, MD;  Location: [facility] OR;  Service: Orthopedics;  Laterality: N/A;  . APPENDECTOMY    . BREAST BIOPSY Left    over 20 years ago negative left breast  . CESAREAN SECTION    . COLON RESECTION     paritial ? reason done in [place]   . COLONOSCOPY    . EXPLORATORY LAPAROTOMY     after C Section  x 2 - 1 for bleeding and 1 for infection  . HERNIA REPAIR     Inguinal - as a child  . TONSILLECTOMY    . TRANSFORAMINAL LUMBAR INTERBODY FUSION (TLIF) WITH PEDICLE SCREW FIXATION 1 LEVEL Right 03/24/2018   Procedure: RIGHT LUMBAR 4 - LUMBAR 5 TRANSFORAMINAL LUMBAR INTERBODY FUSION WITH INSTRUMENTATION AND ALLOGRAFT;  Surgeon: Estill Bamberg, MD;  Location: [facility] OR;  Service: Orthopedics;  Laterality: Right;    Family History  Problem Relation Age of Onset  . Breast cancer Sister 54  . Cancer Sister        breast   . Diabetes Sister   . Miscarriages / Stillbirths Sister   .  Cancer Mother        breast   . Heart disease Mother   . Hypertension Mother   . Miscarriages / [place] Mother   . Heart failure Mother   . Multiple sclerosis Father   . Miscarriages / [place] Daughter   . Arthritis Maternal Grandmother   . Hyperlipidemia Maternal Grandmother   . Learning disabilities Maternal Grandmother   . Cancer Maternal Grandfather   . Diabetes Paternal Grandmother   . Arthritis Sister   . Miscarriages / Stillbirths Sister     SOCIAL HX: lives with husband 1 daughter    Current Outpatient Medications:  .  albuterol (PROAIR HFA) 108 (90 Base) MCG/ACT inhaler, Inhale 1-2 puffs into the lungs every 6 (six) hours as needed for wheezing or shortness of breath. (Patient taking differently: Inhale 2 puffs into the lungs every 6 (six) hours as needed for wheezing or shortness of breath. ), Disp: 1  Inhaler, Rfl: 12 .  Calcium Carbonate-Vitamin D (CALTRATE 600+D PO), Take 1 tablet by mouth 2 (two) times daily. , Disp: , Rfl:  .  cyclobenzaprine (FLEXERIL) 10 MG tablet, Take 1 tablet (10 mg total) by mouth 2 (two) times daily as needed for muscle spasms., Disp: , Rfl:  .  gabapentin (NEURONTIN) 300 MG capsule, Take 1 capsule (300 mg total) by mouth 4 (four) times daily., Disp: 120 capsule, Rfl: 5 .  Glecaprevir-Pibrentasvir (MAVYRET) 100-40 MG TABS, Take 3 tablets by mouth daily with supper., Disp: , Rfl:  .  Multiple Vitamin (MULTIVITAMIN WITH MINERALS) TABS tablet, Take 1 tablet by mouth 2 (two) times daily. , Disp: , Rfl:  .  Multiple Vitamins-Minerals (HAIR/SKIN/NAILS) TABS, Take 1 tablet by mouth daily., Disp: , Rfl:  .  OVER THE COUNTER MEDICATION, Take 1 tablet by mouth 2 (two) times daily. Estraval Hormone Supplement, Disp: , Rfl:  .  OVER THE COUNTER MEDICATION, Take 2 tablets by mouth 2 (two) times daily. Replenex Dietary Supplement, Disp: , Rfl:  .  OVER THE COUNTER MEDICATION, Take 1 tablet by mouth daily. Nutradew Vision Supplement, Disp: , Rfl:  .  OVER THE COUNTER MEDICATION, Take 1 tablet by mouth daily. Unforgettable Cognitive Health Supplement, Disp: , Rfl:  .  verapamil (CALAN-SR) 240 MG CR tablet, Take 1 tablet (240 mg total) by mouth at bedtime., Disp: 90 tablet, Rfl: 3  EXAM:  VITALS per patient if applicable:  GENERAL: alert, oriented, appears well and in no acute distress  PSYCH/NEURO: pleasant and cooperative, no obvious depression or anxiety, speech and thought processing grossly intact  ASSESSMENT AND PLAN:  Discussed the following assessment and plan:  Lumbar radiculopathy - Plan: cyclobenzaprine (FLEXERIL) 10 MG tablet bid prn and gabapentin 300 mg qid  F/u Dr. Yevette Edwards  Essential hypertension - Plan: verapamil (CALAN-SR) 240 MG CR tablet monitor BP  Chronic hepatitis C without hepatic coma (HCC) -will check with Dr. Servando Snare to see labs needed s/p  completion of Mavyret tx I.e CMET, hep C quant vs any otheres   HM Declines flu vx  Disc Tdap vaccine and rec given Tdap Rx today to get at pharmacy   Consider shingrix in future will need to disc  Consider prevnar and pna 23 vaccines in future  Consider hep B vx in future   Colonoscopy 2015 get records from [facility]  Last pap 04/12/15 negative likely insurance will not cover another has medicare  -s/p hysterectomy with h/o cervical changes age 27 1 ovary intact  DEXA 09/12/10 osteopenia consider repeat in future  mammo rec overdue last 10/2014 referred today 3d mammo pt to call and sch -normal breast exam today  -h/o breast cyst per pt and asymmetries per last mammogram + FH breast cancer mother and sister pt agreeable  -consider [facility] genetics testing in future  -agreeable to [facility] mammo and DEXA in future after covid 19 order in   Former smoker 30 years quit in 2010 1-1.5 pk would last 1 week no FH lung cancer. -calc risk score 2.4% consider CT chest lung cancer screening in future disc at f/u    I discussed the assessment and treatment plan with the patient. The patient was provided an opportunity to ask questions and all were answered. The patient agreed with the plan and demonstrated an understanding of the instructions.   The patient was advised to call back or seek an in-person evaluation if the symptoms worsen or if the condition fails to improve as anticipated.  Time spent 15 minutes  Bevelyn Bucklesracy N McLean-Scocuzza, MD

## 2018-06-28 NOTE — Telephone Encounter (Signed)
Left message for patient to return call back. PEC may give and obtain information.  

## 2018-06-28 NOTE — Telephone Encounter (Signed)
Patient called, left VM to return call to the office. 

## 2018-06-29 ENCOUNTER — Telehealth: Payer: Self-pay | Admitting: Internal Medicine

## 2018-06-29 DIAGNOSIS — M4326 Fusion of spine, lumbar region: Secondary | ICD-10-CM | POA: Diagnosis not present

## 2018-06-29 NOTE — Telephone Encounter (Signed)
Patient was informed.  Patient understood and no questions, comments, or concerns at this time.  

## 2018-06-29 NOTE — Telephone Encounter (Signed)
Per GI Dr. Wohl/GI  At the start of treatment we tell the patients that cure is determined by labs done at 1 year. She needs to make sure she calls and has a f/u appt for labs and f/u GI appt for future Dr. Wohls office [phone]  GI said they usually check labs at 6 months to make sure everything is going well. GI office will contact the patient for the labs 1 year after the treatment was completed.   The GI office should contact Amy Grant about scheduling labs based on the above    Inform pt ans she what she says.   Thanks TMS   

## 2018-07-01 DIAGNOSIS — M4326 Fusion of spine, lumbar region: Secondary | ICD-10-CM | POA: Diagnosis not present

## 2018-07-08 DIAGNOSIS — M4326 Fusion of spine, lumbar region: Secondary | ICD-10-CM | POA: Diagnosis not present

## 2018-07-12 DIAGNOSIS — M545 Low back pain: Secondary | ICD-10-CM | POA: Diagnosis not present

## 2018-07-12 DIAGNOSIS — M4326 Fusion of spine, lumbar region: Secondary | ICD-10-CM | POA: Diagnosis not present

## 2018-08-17 ENCOUNTER — Other Ambulatory Visit: Payer: Self-pay

## 2018-08-17 DIAGNOSIS — B182 Chronic viral hepatitis C: Secondary | ICD-10-CM

## 2018-08-25 ENCOUNTER — Other Ambulatory Visit: Payer: Self-pay | Admitting: Internal Medicine

## 2018-08-25 ENCOUNTER — Telehealth: Payer: Self-pay | Admitting: *Deleted

## 2018-08-25 ENCOUNTER — Other Ambulatory Visit
Admission: RE | Admit: 2018-08-25 | Discharge: 2018-08-25 | Disposition: A | Discharge status: Home / Self Care | Payer: Medicare Other | Source: Ambulatory Visit | Attending: Gastroenterology | Admitting: Gastroenterology

## 2018-08-25 DIAGNOSIS — B182 Chronic viral hepatitis C: Secondary | ICD-10-CM | POA: Diagnosis not present

## 2018-08-25 DIAGNOSIS — N6489 Other specified disorders of breast: Secondary | ICD-10-CM

## 2018-08-25 DIAGNOSIS — Z1231 Encounter for screening mammogram for malignant neoplasm of breast: Secondary | ICD-10-CM

## 2018-08-25 LAB — HEPATIC FUNCTION PANEL
ALT: 15 U/L (ref 0–44)
AST: 23 U/L (ref 15–41)
Albumin: 4 g/dL (ref 3.5–5.0)
Alkaline Phosphatase: 66 U/L (ref 38–126)
Bilirubin, Direct: 0.2 mg/dL (ref 0.0–0.2)
Indirect Bilirubin: 0.7 mg/dL (ref 0.3–0.9)
Total Bilirubin: 0.9 mg/dL (ref 0.3–1.2)
Total Protein: 6.5 g/dL (ref 6.5–8.1)

## 2018-08-25 NOTE — Telephone Encounter (Signed)
Pt wanted to go to [facility] in [place] please confirm with patient and [facility] schedule due to h/o abnormal mammograms  Ordered b/l screening mammo with b/l [place]   Thanks [place]

## 2018-08-25 NOTE — Telephone Encounter (Signed)
Copied from [place] [phone]. Topic: General - Inquiry >> Aug 25, 2018 12:51 PM Selinda Flavin B, NT wrote: Reason for CRM: Patient calling and would like to know if her Mammogram order could be changed to a mammogram with Ultrasound? Please advise.

## 2018-08-26 LAB — HCV RNA QUANT: HCV Quantitative: NOT DETECTED IU/mL (ref >50)

## 2018-08-31 ENCOUNTER — Telehealth: Payer: Self-pay | Admitting: Gastroenterology

## 2018-08-31 NOTE — Telephone Encounter (Signed)
Patient called & would like lab work results from last week.

## 2018-09-01 NOTE — Telephone Encounter (Signed)
Pt is returning a call for [place]

## 2018-09-01 NOTE — Telephone Encounter (Signed)
LVM for pt to return my call regarding recent lab results.   

## 2018-09-01 NOTE — Telephone Encounter (Signed)
Pt notified of lab results

## 2018-09-09 DIAGNOSIS — M8589 Other specified disorders of bone density and structure, multiple sites: Secondary | ICD-10-CM | POA: Diagnosis not present

## 2018-09-09 DIAGNOSIS — Z780 Asymptomatic menopausal state: Secondary | ICD-10-CM | POA: Diagnosis not present

## 2018-09-09 DIAGNOSIS — N6489 Other specified disorders of breast: Secondary | ICD-10-CM | POA: Diagnosis not present

## 2018-09-09 DIAGNOSIS — J45909 Unspecified asthma, uncomplicated: Secondary | ICD-10-CM | POA: Diagnosis not present

## 2018-09-09 DIAGNOSIS — Z90710 Acquired absence of both cervix and uterus: Secondary | ICD-10-CM | POA: Diagnosis not present

## 2018-09-09 DIAGNOSIS — Z981 Arthrodesis status: Secondary | ICD-10-CM | POA: Diagnosis not present

## 2018-09-09 DIAGNOSIS — Z803 Family history of malignant neoplasm of breast: Secondary | ICD-10-CM | POA: Diagnosis not present

## 2018-09-09 LAB — HM DEXA SCAN

## 2018-09-09 LAB — HM MAMMOGRAPHY

## 2018-09-10 ENCOUNTER — Telehealth: Payer: Self-pay | Admitting: Internal Medicine

## 2018-09-10 ENCOUNTER — Encounter: Payer: Self-pay | Admitting: Internal Medicine

## 2018-09-10 NOTE — Telephone Encounter (Signed)
Bone density 09/09/2018 thin bones=osteopenia rec calcium otc 600 mg 1-2 tablets daily  Exercise if possible

## 2018-09-15 ENCOUNTER — Telehealth: Payer: Self-pay | Admitting: Internal Medicine

## 2018-09-15 ENCOUNTER — Encounter: Payer: Self-pay | Admitting: Internal Medicine

## 2018-09-15 ENCOUNTER — Telehealth: Payer: Self-pay

## 2018-09-15 NOTE — Telephone Encounter (Signed)
She had mammogram results pending due to they have not resulted at [facility] b/c they needed prior mammogram from [place]

## 2018-09-15 NOTE — Telephone Encounter (Signed)
I dont have mammogram results yet I am calling [facility] to get results   [place]

## 2018-09-15 NOTE — Telephone Encounter (Signed)
Copied from [place] [phone]. Topic: General - Other >> Sep 15, 2018  3:52 PM Mcneil, Ja-Kwan wrote: Reason for CRM: Pt stated she had x-ray of her breasts and she would like the results. Pt requests call back.

## 2018-09-15 NOTE — Telephone Encounter (Signed)
Amy Grant is waiting to review her images from [facility] 10/2014 which is why mammogram has not been resulted yet and they will fax to me when ready   [place]

## 2018-09-15 NOTE — Telephone Encounter (Signed)
Pt. returned call; given bone density results, and recommendations by Dr. Olivia Mackie from 09/10/18. Pt. Verb. Understanding.  Pt. Questioned about results on Mammogram.  Noted that Mammogram has not been reviewed / signed by Dr. Olivia Mackie.  Advised will send message to Dr. Olivia Mackie re: results of Mammogram.  Pt. Stated she was eager to get mammogram results.

## 2018-09-15 NOTE — Telephone Encounter (Signed)
Left message for patient to return call back. PEC may give results and  information.  

## 2018-09-16 ENCOUNTER — Telehealth: Payer: Self-pay | Admitting: Internal Medicine

## 2018-09-16 ENCOUNTER — Encounter: Payer: Self-pay | Admitting: Internal Medicine

## 2018-09-16 NOTE — Telephone Encounter (Signed)
Patient has been informed.

## 2018-09-16 NOTE — Telephone Encounter (Signed)
Mammogram normal f/u in 1 year   [place]

## 2018-09-17 NOTE — Telephone Encounter (Signed)
Left detailed msg for patient

## 2018-10-20 ENCOUNTER — Other Ambulatory Visit: Payer: Self-pay | Admitting: Internal Medicine

## 2018-10-20 DIAGNOSIS — G8929 Other chronic pain: Secondary | ICD-10-CM

## 2018-10-20 DIAGNOSIS — M545 Low back pain, unspecified: Secondary | ICD-10-CM

## 2018-10-20 MED ORDER — MELOXICAM 7.5 MG PO TABS: 7.5000 mg | ORAL_TABLET | Freq: Two times a day (BID) | ORAL | 5 refills | Status: DC | PRN

## 2018-12-07 ENCOUNTER — Other Ambulatory Visit: Payer: Self-pay | Admitting: Internal Medicine

## 2018-12-07 DIAGNOSIS — M5416 Radiculopathy, lumbar region: Secondary | ICD-10-CM

## 2018-12-07 MED ORDER — GABAPENTIN 300 MG PO CAPS: 300.0000 mg | ORAL_CAPSULE | Freq: Four times a day (QID) | ORAL | 5 refills | Status: DC

## 2018-12-27 ENCOUNTER — Telehealth: Payer: Self-pay | Admitting: *Deleted

## 2018-12-27 NOTE — Telephone Encounter (Signed)
Copied from [place] [phone]. Topic: Quick Communication - Rx Refill/Question >> Dec 27, 2018 12:22 PM Carolyn Stare wrote: Medication cyclobenzaprine (FLEXERIL) 10 MG tablet  Preferred Pharmacy [place]   Agent: Please be advised that RX refills may take up to 3 business days. We ask that you follow-up with your pharmacy.

## 2018-12-28 ENCOUNTER — Ambulatory Visit: Payer: Medicare Other

## 2018-12-28 ENCOUNTER — Ambulatory Visit: Payer: Medicare Other | Admitting: Internal Medicine

## 2018-12-29 ENCOUNTER — Other Ambulatory Visit: Payer: Self-pay | Admitting: Internal Medicine

## 2018-12-29 DIAGNOSIS — M5416 Radiculopathy, lumbar region: Secondary | ICD-10-CM

## 2018-12-29 MED ORDER — CYCLOBENZAPRINE HCL 5 MG PO TABS: 5.0000 mg | ORAL_TABLET | Freq: Two times a day (BID) | ORAL | 4 refills | Status: DC | PRN

## 2019-04-01 ENCOUNTER — Other Ambulatory Visit: Payer: Self-pay | Admitting: Internal Medicine

## 2019-04-01 ENCOUNTER — Telehealth: Payer: Self-pay | Admitting: Internal Medicine

## 2019-04-01 DIAGNOSIS — M5416 Radiculopathy, lumbar region: Secondary | ICD-10-CM

## 2019-04-01 MED ORDER — CYCLOBENZAPRINE HCL 10 MG PO TABS: 10.0000 mg | ORAL_TABLET | Freq: Two times a day (BID) | ORAL | 5 refills | Status: DC | PRN

## 2019-04-01 NOTE — Telephone Encounter (Signed)
Patient is calling about her cyclobenzaprine (FLEXERIL) 5 MG tablet. She needs a refill but needs it to be 10mg . She is taking two a day to equal 10mg  and a 30 day is not lasting her.

## 2019-04-05 ENCOUNTER — Other Ambulatory Visit: Payer: Self-pay | Admitting: Internal Medicine

## 2019-04-05 DIAGNOSIS — M545 Low back pain, unspecified: Secondary | ICD-10-CM

## 2019-04-05 DIAGNOSIS — G8929 Other chronic pain: Secondary | ICD-10-CM

## 2019-04-05 MED ORDER — MELOXICAM 7.5 MG PO TABS: 7.5000 mg | ORAL_TABLET | Freq: Two times a day (BID) | ORAL | 5 refills | Status: DC | PRN

## 2019-04-12 ENCOUNTER — Telehealth: Payer: Self-pay | Admitting: Internal Medicine

## 2019-04-12 NOTE — Telephone Encounter (Signed)
Review with pharmacist and look on labels on medications  Gabapentin can  Rec multivitamin and hair skin and nail vitamin  Can try giovannis tea tree shampoo  Stop chemical processing   TMS

## 2019-04-12 NOTE — Telephone Encounter (Signed)
Pt wants to know if any of her medications can cause hair loss/thinning. Please advise.

## 2019-04-13 NOTE — Telephone Encounter (Signed)
Patient has been informed.

## 2019-06-01 ENCOUNTER — Other Ambulatory Visit: Payer: Self-pay | Admitting: Internal Medicine

## 2019-06-01 DIAGNOSIS — M5416 Radiculopathy, lumbar region: Secondary | ICD-10-CM

## 2019-06-01 MED ORDER — GABAPENTIN 300 MG PO CAPS: 300.0000 mg | ORAL_CAPSULE | Freq: Four times a day (QID) | ORAL | 5 refills | Status: DC

## 2019-06-30 ENCOUNTER — Ambulatory Visit (INDEPENDENT_AMBULATORY_CARE_PROVIDER_SITE_OTHER): Payer: Medicare Other | Admitting: Internal Medicine

## 2019-06-30 ENCOUNTER — Other Ambulatory Visit: Payer: Self-pay

## 2019-06-30 ENCOUNTER — Encounter: Payer: Self-pay | Admitting: Internal Medicine

## 2019-06-30 VITALS — BP 118/80 | HR 94 | Temp 97.3°F | Ht 65.0 in | Wt 141.4 lb

## 2019-06-30 DIAGNOSIS — E611 Iron deficiency: Secondary | ICD-10-CM

## 2019-06-30 DIAGNOSIS — J4520 Mild intermittent asthma, uncomplicated: Secondary | ICD-10-CM | POA: Diagnosis not present

## 2019-06-30 DIAGNOSIS — M5441 Lumbago with sciatica, right side: Secondary | ICD-10-CM

## 2019-06-30 DIAGNOSIS — I10 Essential (primary) hypertension: Secondary | ICD-10-CM | POA: Diagnosis not present

## 2019-06-30 DIAGNOSIS — Z1329 Encounter for screening for other suspected endocrine disorder: Secondary | ICD-10-CM | POA: Diagnosis not present

## 2019-06-30 DIAGNOSIS — Z1389 Encounter for screening for other disorder: Secondary | ICD-10-CM | POA: Diagnosis not present

## 2019-06-30 DIAGNOSIS — B1920 Unspecified viral hepatitis C without hepatic coma: Secondary | ICD-10-CM

## 2019-06-30 DIAGNOSIS — Z1231 Encounter for screening mammogram for malignant neoplasm of breast: Secondary | ICD-10-CM

## 2019-06-30 DIAGNOSIS — M545 Low back pain, unspecified: Secondary | ICD-10-CM

## 2019-06-30 DIAGNOSIS — M5416 Radiculopathy, lumbar region: Secondary | ICD-10-CM | POA: Diagnosis not present

## 2019-06-30 DIAGNOSIS — G8929 Other chronic pain: Secondary | ICD-10-CM | POA: Insufficient documentation

## 2019-06-30 DIAGNOSIS — L650 Telogen effluvium: Secondary | ICD-10-CM | POA: Diagnosis not present

## 2019-06-30 MED ORDER — GABAPENTIN 300 MG PO CAPS: 300.0000 mg | ORAL_CAPSULE | Freq: Four times a day (QID) | ORAL | 5 refills | Status: DC

## 2019-06-30 MED ORDER — CYCLOBENZAPRINE HCL 10 MG PO TABS: 10.0000 mg | ORAL_TABLET | Freq: Two times a day (BID) | ORAL | 5 refills | Status: DC | PRN

## 2019-06-30 MED ORDER — ALBUTEROL SULFATE HFA 108 (90 BASE) MCG/ACT IN AERS: 2.0000 | INHALATION_SPRAY | Freq: Four times a day (QID) | RESPIRATORY_TRACT | 11 refills | Status: DC | PRN

## 2019-06-30 MED ORDER — MELOXICAM 7.5 MG PO TABS: 7.5000 mg | ORAL_TABLET | Freq: Two times a day (BID) | ORAL | 5 refills | Status: DC | PRN

## 2019-06-30 MED ORDER — VERAPAMIL HCL ER 240 MG PO TBCR: 240.0000 mg | EXTENDED_RELEASE_TABLET | Freq: Every day | ORAL | 3 refills | Status: DC

## 2019-06-30 NOTE — Progress Notes (Signed)
Chief Complaint  Patient presents with  . Follow-up  . Medication Management    discuss stopping Gabapentin and alternatives.    F/u  1. Hair loss likely stress related 16/69 y.o grand daughter moved to [facility] with her  Dad or related to meds she is on reviewed gabapentin on hair loss, verapamil she has been on <1% chance of hair loss 2. Hep C needs f/u GI Dr. Servando Snare 1 year out  3. Chronic back pain with radiculopathy down right leg to the foot on gabapentin qid has tried epidural injections and had back surgery [facility] in [place] Dr. Carlena Sax 3 back injection did not help  She has a tens unit and is able to use a rower at home   Review of Systems  Constitutional: Negative for weight loss.  HENT: Negative for hearing loss.   Eyes: Negative for blurred vision.  Respiratory: Negative for shortness of breath.   Cardiovascular: Negative for chest pain.  Gastrointestinal: Negative for abdominal pain.  Musculoskeletal: Positive for back pain. Negative for falls.  Skin: Negative for rash.       +hair loss   Neurological: Negative for headaches.  Psychiatric/Behavioral: Negative for memory loss.       +stress    Past Medical History:  Diagnosis Date  . Anxiety   . Arthritis    right hand, DDD L 4 , L 5  . Asthma   . Blood in stool   . Chronic intermittent post-traumatic headache   . Colon polyps   . Complication of anesthesia 2018   difficulty waking up   . Depression   . Family history of adverse reaction to anesthesia    Sister woke up during surgery  . GERD (gastroesophageal reflux disease)   . Head injury   . Headache    h/o migraines   . Hepatitis   . Hepatitis C    treated with mavyret per GI  . History of blood transfusion   . Hypertension   . Laryngopharyngeal reflux (LPR)   . Multiple falls   . Pancreatitis    remote hx w/o h/o drinking at that time  . Seizures (HCC)     after head injury  . Vitamin D deficiency    Past Surgical History:  Procedure  Laterality Date  . ABDOMINAL HYSTERECTOMY  1979   has an ovary left s/p hysterectomy 1979; h/o abnormal pap   . acdf     08/07/16 Dr. Yevette Edwards   . ANTERIOR CERVICAL DECOMP/DISCECTOMY FUSION N/A 08/07/2016   Procedure: ANTERIOR CERVICAL DECOMPRESSION FUSION, CERVICAL FOUR-FIVE  WITH INSTRUMENTATION AND ALLOGRAFT;  Surgeon: Estill Bamberg, MD;  Location: [facility] OR;  Service: Orthopedics;  Laterality: N/A;  . APPENDECTOMY    . BREAST BIOPSY Left    over 20 years ago negative left breast  . CESAREAN SECTION    . COLON RESECTION     paritial ? reason done in [place]   . COLONOSCOPY    . EXPLORATORY LAPAROTOMY     after C Section  x 2 - 1 for bleeding and 1 for infection  . HERNIA REPAIR     Inguinal - as a child  . TONSILLECTOMY    . TRANSFORAMINAL LUMBAR INTERBODY FUSION (TLIF) WITH PEDICLE SCREW FIXATION 1 LEVEL Right 03/24/2018   Procedure: RIGHT LUMBAR 4 - LUMBAR 5 TRANSFORAMINAL LUMBAR INTERBODY FUSION WITH INSTRUMENTATION AND ALLOGRAFT;  Surgeon: Estill Bamberg, MD;  Location: [facility] OR;  Service: Orthopedics;  Laterality: Right;   Family History  Problem  Relation Age of Onset  . Breast cancer Sister 64  . Cancer Sister        breast   . Diabetes Sister   . Miscarriages / Stillbirths Sister   . Cancer Mother        breast   . Heart disease Mother   . Hypertension Mother   . Miscarriages / [place] Mother   . Heart failure Mother   . Multiple sclerosis Father   . Miscarriages / [place] Daughter   . Arthritis Maternal Grandmother   . Hyperlipidemia Maternal Grandmother   . Learning disabilities Maternal Grandmother   . Cancer Maternal Grandfather   . Diabetes Paternal Grandmother   . Arthritis Sister   . Miscarriages / [place] Sister    Social History   Socioeconomic History  . Marital status: Divorced    Spouse name: Not on file  . Number of children: Not on file  . Years of education: Not on file  . Highest education level: Not on file  Occupational History  .  Not on file  Tobacco Use  . Smoking status: Former Smoker    Years: 30.00    Quit date: 2013    Years since quitting: 8.3  . Smokeless tobacco: Never Used  Substance and Sexual Activity  . Alcohol use: Yes    Alcohol/week: 7.0 standard drinks    Types: 7 Glasses of wine per week    Comment: per week  . Drug use: No  . Sexual activity: Not on file  Other Topics Concern  . Not on file  Social History Narrative   Associate degree    Wears seat belt, feels safe in marriage to Ron   1 daughter    Social Determinants of Health   Financial Resource Strain:   . Difficulty of Paying Living Expenses:   Food Insecurity:   . Worried About Programme researcher, broadcasting/film/video in the Last Year:   . Barista in the Last Year:   Transportation Needs:   . Freight forwarder (Medical):   Marland Kitchen Lack of Transportation (Non-Medical):   Physical Activity:   . Days of Exercise per Week:   . Minutes of Exercise per Session:   Stress:   . Feeling of Stress :   Social Connections:   . Frequency of Communication with Friends and Family:   . Frequency of Social Gatherings with Friends and Family:   . Attends Religious Services:   . Active Member of Clubs or Organizations:   . Attends Banker Meetings:   Marland Kitchen Marital Status:   Intimate Partner Violence:   . Fear of Current or Ex-Partner:   . Emotionally Abused:   Marland Kitchen Physically Abused:   . Sexually Abused:    Current Meds  Medication Sig  . acetaminophen (TYLENOL) 500 MG tablet Take 500 mg by mouth at bedtime.  Marland Kitchen albuterol (PROAIR HFA) 108 (90 Base) MCG/ACT inhaler Inhale 2 puffs into the lungs every 6 (six) hours as needed for wheezing or shortness of breath.  . Calcium Carbonate-Vitamin D (CALTRATE 600+D PO) Take 1 tablet by mouth 2 (two) times daily.   . cyclobenzaprine (FLEXERIL) 10 MG tablet Take 1 tablet (10 mg total) by mouth 2 (two) times daily as needed for muscle spasms.  Marland Kitchen gabapentin (NEURONTIN) 300 MG capsule Take 1 capsule (300  mg total) by mouth 4 (four) times daily.  . meloxicam (MOBIC) 7.5 MG tablet Take 1 tablet (7.5 mg total) by mouth 2 (two) times  daily as needed for pain.  . Multiple Vitamin (MULTIVITAMIN WITH MINERALS) TABS tablet Take 1 tablet by mouth 2 (two) times daily.   Marland Kitchen OVER THE COUNTER MEDICATION Take 1 tablet by mouth 2 (two) times daily. Estraval Hormone Supplement  . OVER THE COUNTER MEDICATION Take 2 tablets by mouth 2 (two) times daily. Replenex Dietary Supplement  . OVER THE COUNTER MEDICATION Take 1 tablet by mouth daily. Nutradew Vision Supplement  . OVER THE COUNTER MEDICATION Take 1 tablet by mouth daily. Unforgettable Cognitive Health Supplement  . verapamil (CALAN-SR) 240 MG CR tablet Take 1 tablet (240 mg total) by mouth at bedtime.  . [DISCONTINUED] albuterol (PROAIR HFA) 108 (90 Base) MCG/ACT inhaler Inhale 1-2 puffs into the lungs every 6 (six) hours as needed for wheezing or shortness of breath. (Patient taking differently: Inhale 2 puffs into the lungs every 6 (six) hours as needed for wheezing or shortness of breath. )  . [DISCONTINUED] cyclobenzaprine (FLEXERIL) 10 MG tablet Take 1 tablet (10 mg total) by mouth 2 (two) times daily as needed for muscle spasms.  . [DISCONTINUED] gabapentin (NEURONTIN) 300 MG capsule Take 1 capsule (300 mg total) by mouth 4 (four) times daily.  . [DISCONTINUED] meloxicam (MOBIC) 7.5 MG tablet Take 1 tablet (7.5 mg total) by mouth 2 (two) times daily as needed for pain.  . [DISCONTINUED] Multiple Vitamins-Minerals (HAIR/SKIN/NAILS) TABS Take 1 tablet by mouth daily.  . [DISCONTINUED] verapamil (CALAN-SR) 240 MG CR tablet Take 1 tablet (240 mg total) by mouth at bedtime.   Allergies  Allergen Reactions  . Latex Hives  . Sulfa Antibiotics Hives  . Linaclotide Itching   No results found for this or any previous visit (from the past 2160 hour(s)). Objective  Body mass index is 23.53 kg/m. Wt Readings from Last 3 Encounters:  06/30/19 141 lb 6.4 oz  (64.1 kg)  03/24/18 136 lb (61.7 kg)  03/18/18 136 lb 1.6 oz (61.7 kg)   Temp Readings from Last 3 Encounters:  06/30/19 (!) 97.3 F (36.3 C) (Temporal)  03/25/18 97.7 F (36.5 C) (Oral)  03/18/18 98.4 F (36.9 C)   BP Readings from Last 3 Encounters:  06/30/19 118/80  03/25/18 140/84  03/18/18 140/80   Pulse Readings from Last 3 Encounters:  06/30/19 94  03/25/18 85  03/18/18 85    Physical Exam Vitals and nursing note reviewed.  Constitutional:      Appearance: Normal appearance. She is well-developed and well-groomed.  HENT:     Head: Normocephalic and atraumatic.  Eyes:     Conjunctiva/sclera: Conjunctivae normal.     Pupils: Pupils are equal, round, and reactive to light.  Cardiovascular:     Rate and Rhythm: Normal rate and regular rhythm.     Heart sounds: Normal heart sounds. No murmur.  Pulmonary:     Effort: Pulmonary effort is normal.     Breath sounds: Normal breath sounds.  Skin:    General: Skin is warm and dry.  Neurological:     General: No focal deficit present.     Mental Status: She is alert and oriented to person, place, and time. Mental status is at baseline.     Gait: Gait normal.  Psychiatric:        Attention and Perception: Attention and perception normal.        Mood and Affect: Mood and affect normal.        Speech: Speech normal.        Behavior: Behavior normal. Behavior is  cooperative.        Thought Content: Thought content normal.        Cognition and Memory: Cognition and memory normal.        Judgment: Judgment normal.     Assessment  Plan  Telogen effluvium Stop chemical processing  Let know if wants steroid solution for scalp or derm referral   Hair skin and nails vitamin  giovannis shampoo with tea tree + conditioner same Vital proteins with collagen strawberry lemonade at [employer] or online  Biosil solution at whole foods  Iron 325 mg daily=ferrous sulfate or 65 FE  Hair products I like  Influence or Morrocan oil  products (with serum) the one for moisture, or Olaplex ([employer])   Mild intermittent asthma without complication - Plan: albuterol (PROAIR HFA) 108 (90 Base) MCG/ACT inhaler  Chronic low back pain, right radiculopathy - Plan: meloxicam (MOBIC) 7.5 MG tablet Cont gabapentin 300 mg qid for now Prn Flexeril 10 mg prn  Essential hypertension - Plan: verapamil (CALAN-SR) 240 MG CR tablet, Comprehensive metabolic panel, Lipid panel, CBC with Differential/Platelet, TSH  Hepatitis C virus infection without hepatic coma, unspecified chronicity - Plan: Comprehensive metabolic panel [facility] Dr. Allen Norris to see if due for f/u   Iron deficiency - Plan: Iron, TIBC and Ferritin Panel   HM Declines flu vx  Disc Tdap vaccine and recgiven Tdap Rx today to get at pharmacy Consider shingrix in future will need to disc Consider prevnar and pna 23 vaccines in future Consider hep B vx in future  covid vaccine not going to get she is unsure   Colonoscopy 2015 get records from [facility]  Last pap2/9/17 negative likely insurance will not cover another has medicare  -s/p hysterectomy with h/o cervical changes age 43 1 ovary intact DEXA 09/09/2018 -1.8 to -1.9 osteopenia [facility]  mammo 09/09/2018 normal [facility] negative ordered repeat  -h/o breast cyst per pt and asymmetries per last mammogram + FH breast cancer mother and sister pt agreeable  -consider [facility] genetics testing in future  -agreeable to [facility]o and DEXA in future after covid 19 order in   Former smoker 30 years quit in 2010 1-1.5 pk would last 1 week no FH lung cancer. -calc risk score 2.4% consider CT chest lung cancer screening in future disc at f/u  Provider: Dr. Olivia Mackie McLean-Scocuzza-[facility]

## 2019-06-30 NOTE — Patient Instructions (Addendum)
Hair skin and nails vitamin  giovannis shampoo with tea tree + conditioner same Vital proteins with collagen strawberry lemonade at [employer] or online  Biosil solution at whole foods  Iron 325 mg daily=ferrous sulfate or 65 FE   Hair products I like  Influence or Morrocan oil products (with serum) the one for moisture, or Olaplex ([employer])   Telogen Effluvium =hair loss related   Pfizer x 2 doses  COVID-19 Vaccine Information can be found at: ShippingScam.co.uk For questions related to vaccine distribution or appointments, please email [email or web address] or call [phone].     Back Exercises The following exercises strengthen the muscles that help to support the trunk and back. They also help to keep the lower back flexible. Doing these exercises can help to prevent back pain or lessen existing pain.  If you have back pain or discomfort, try doing these exercises 2-3 times each day or as told by your health care provider.  As your pain improves, do them once each day, but increase the number of times that you repeat the steps for each exercise (do more repetitions).  To prevent the recurrence of back pain, continue to do these exercises once each day or as told by your health care provider. Do exercises exactly as told by your health care provider and adjust them as directed. It is normal to feel mild stretching, pulling, tightness, or discomfort as you do these exercises, but you should stop right away if you feel sudden pain or your pain gets worse. Exercises Single knee to chest Repeat these steps 3-5 times for each leg: 1. Lie on your back on a firm bed or the floor with your legs extended. 2. Bring one knee to your chest. Your other leg should stay extended and in contact with the floor. 3. Hold your knee in place by grabbing your knee or thigh with both hands and hold. 4. Pull on your knee until you feel a gentle  stretch in your lower back or buttocks. 5. Hold the stretch for 10-30 seconds. 6. Slowly release and straighten your leg. Pelvic tilt Repeat these steps 5-10 times: 1. Lie on your back on a firm bed or the floor with your legs extended. 2. Bend your knees so they are pointing toward the ceiling and your feet are flat on the floor. 3. Tighten your lower abdominal muscles to press your lower back against the floor. This motion will tilt your pelvis so your tailbone points up toward the ceiling instead of pointing to your feet or the floor. 4. With gentle tension and even breathing, hold this position for 5-10 seconds. Cat-cow Repeat these steps until your lower back becomes more flexible: 1. Get into a hands-and-knees position on a firm surface. Keep your hands under your shoulders, and keep your knees under your hips. You may place padding under your knees for comfort. 2. Let your head hang down toward your chest. Contract your abdominal muscles and point your tailbone toward the floor so your lower back becomes rounded like the back of a cat. 3. Hold this position for 5 seconds. 4. Slowly lift your head, let your abdominal muscles relax and point your tailbone up toward the ceiling so your back forms a sagging arch like the back of a cow. 5. Hold this position for 5 seconds.  Press-ups Repeat these steps 5-10 times: 1. Lie on your abdomen (face-down) on the floor. 2. Place your palms near your head, about shoulder-width apart. 3. Keeping your back as  relaxed as possible and keeping your hips on the floor, slowly straighten your arms to raise the top half of your body and lift your shoulders. Do not use your back muscles to raise your upper torso. You may adjust the placement of your hands to make yourself more comfortable. 4. Hold this position for 5 seconds while you keep your back relaxed. 5. Slowly return to lying flat on the floor.  Bridges Repeat these steps 10 times: 1. Lie on your  back on a firm surface. 2. Bend your knees so they are pointing toward the ceiling and your feet are flat on the floor. Your arms should be flat at your sides, next to your body. 3. Tighten your buttocks muscles and lift your buttocks off the floor until your waist is at almost the same height as your knees. You should feel the muscles working in your buttocks and the back of your thighs. If you do not feel these muscles, slide your feet 1-2 inches farther away from your buttocks. 4. Hold this position for 3-5 seconds. 5. Slowly lower your hips to the starting position, and allow your buttocks muscles to relax completely. If this exercise is too easy, try doing it with your arms crossed over your chest. Abdominal crunches Repeat these steps 5-10 times: 1. Lie on your back on a firm bed or the floor with your legs extended. 2. Bend your knees so they are pointing toward the ceiling and your feet are flat on the floor. 3. Cross your arms over your chest. 4. Tip your chin slightly toward your chest without bending your neck. 5. Tighten your abdominal muscles and slowly raise your trunk (torso) high enough to lift your shoulder blades a tiny bit off the floor. Avoid raising your torso higher than that because it can put too much stress on your low back and does not help to strengthen your abdominal muscles. 6. Slowly return to your starting position. Back lifts Repeat these steps 5-10 times: 1. Lie on your abdomen (face-down) with your arms at your sides, and rest your forehead on the floor. 2. Tighten the muscles in your legs and your buttocks. 3. Slowly lift your chest off the floor while you keep your hips pressed to the floor. Keep the back of your head in line with the curve in your back. Your eyes should be looking at the floor. 4. Hold this position for 3-5 seconds. 5. Slowly return to your starting position. Contact a health care provider if:  Your back pain or discomfort gets much worse  when you do an exercise.  Your worsening back pain or discomfort does not lessen within 2 hours after you exercise. If you have any of these problems, stop doing these exercises right away. Do not do them again unless your health care provider says that you can. Get help right away if:  You develop sudden, severe back pain. If this happens, stop doing the exercises right away. Do not do them again unless your health care provider says that you can. This information is not intended to replace advice given to you by your health care provider. Make sure you discuss any questions you have with your health care provider. Document Revised: 06/24/2018 Document Reviewed: 11/19/2017 Elsevier Patient Education  2020 [employer].

## 2019-07-27 ENCOUNTER — Ambulatory Visit (INDEPENDENT_AMBULATORY_CARE_PROVIDER_SITE_OTHER): Payer: Medicare Other

## 2019-07-27 VITALS — Ht 65.0 in | Wt 141.0 lb

## 2019-07-27 DIAGNOSIS — Z0000 Encounter for general adult medical examination without abnormal findings: Secondary | ICD-10-CM | POA: Diagnosis not present

## 2019-07-27 NOTE — Patient Instructions (Addendum)
  Ms. Bassin , Thank you for taking time to come for your Medicare Wellness Visit. I appreciate your ongoing commitment to your health goals. Please review the following plan we discussed and let me know if I can assist you in the future.   These are the goals we discussed: Goals    . Follow up with PCP as needed     Healthy diet Stay active as tolerated       This is a list of the screening recommended for you and due dates:  Health Maintenance  Topic Date Due  . COVID-19 Vaccine (1) Never done  . Colon Cancer Screening  07/26/2020*  . Tetanus Vaccine  07/26/2020*  . Pneumonia vaccines (1 of 2 - PCV13) 07/26/2020*  . Flu Shot  10/02/2019  . Mammogram  09/08/2020  . DEXA scan (bone density measurement)  Completed  .  Hepatitis C: One time screening is recommended by Center for Disease Control  (CDC) for  adults born from [birth year] through 1963.   Completed  *Topic was postponed. The date shown is not the original due date.

## 2019-07-27 NOTE — Progress Notes (Addendum)
Subjective:   Amy Grant is a 69 y.o. female who presents for Medicare Annual (Subsequent) preventive examination.  Review of Systems:  No ROS.  Medicare Wellness Virtual Visit.  Visual/audio telehealth visit, UTA vital signs.   See social history for additional risk factors.   Cardiac Risk Factors include: hypertension     Objective:     Vitals: Ht 5\' 5"  (1.651 m)   Wt 141 lb (64 kg)   BMI 23.46 kg/m   Body mass index is 23.46 kg/m.  Advanced Directives 07/27/2019 03/25/2018 03/18/2018 12/24/2017 08/07/2016 08/01/2016  Does Patient Have a Medical Advance Directive? No Yes Yes No No No  Type of Advance Directive - Living will;Healthcare Power of Attorney Living will;Healthcare Power of Attorney - - -  Does patient want to make changes to medical advance directive? - No - Patient declined No - Patient declined - - -  Copy of Healthcare Power of Attorney in Chart? - No - copy requested No - copy requested - - -  Would patient like information on creating a medical advance directive? Yes (MAU/Ambulatory/Procedural Areas - Information given) - - Yes (MAU/Ambulatory/Procedural Areas - Information given) Yes (MAU/Ambulatory/Procedural Areas - Information given) Yes (MAU/Ambulatory/Procedural Areas - Information given)    Tobacco Social History   Tobacco Use  Smoking Status Former Smoker  . Years: 30.00  . Quit date: 2013  . Years since quitting: 8.4  Smokeless Tobacco Never Used     Counseling given: Not Answered   Clinical Intake:  Pre-visit preparation completed: Yes        Diabetes: No  How often do you need to have someone help you when you read instructions, pamphlets, or other written materials from your doctor or pharmacy?: 1 - Never  Interpreter Needed?: No     Past Medical History:  Diagnosis Date  . Anxiety   . Arthritis    right hand, DDD L 4 , L 5  . Asthma   . Blood in stool   . Chronic intermittent post-traumatic headache   . Colon polyps     . Complication of anesthesia 2018   difficulty waking up   . Depression   . Family history of adverse reaction to anesthesia    Sister woke up during surgery  . GERD (gastroesophageal reflux disease)   . Head injury   . Headache    h/o migraines   . Hepatitis   . Hepatitis C    treated with mavyret per GI  . History of blood transfusion   . Hypertension   . Laryngopharyngeal reflux (LPR)   . Multiple falls   . Pancreatitis    remote hx w/o h/o drinking at that time  . Seizures (HCC)     after head injury  . Vitamin D deficiency    Past Surgical History:  Procedure Laterality Date  . ABDOMINAL HYSTERECTOMY  1979   has an ovary left s/p hysterectomy 1979; h/o abnormal pap   . acdf     08/07/16 Dr. 10/07/16   . ANTERIOR CERVICAL DECOMP/DISCECTOMY FUSION N/A 08/07/2016   Procedure: ANTERIOR CERVICAL DECOMPRESSION FUSION, CERVICAL FOUR-FIVE  WITH INSTRUMENTATION AND ALLOGRAFT;  Surgeon: 10/07/2016, MD;  Location: [facility] OR;  Service: Orthopedics;  Laterality: N/A;  . APPENDECTOMY    . BREAST BIOPSY Left    over 20 years ago negative left breast  . CESAREAN SECTION    . COLON RESECTION     paritial ? reason done in [place]   .  COLONOSCOPY    . EXPLORATORY LAPAROTOMY     after C Section  x 2 - 1 for bleeding and 1 for infection  . HERNIA REPAIR     Inguinal - as a child  . TONSILLECTOMY    . TRANSFORAMINAL LUMBAR INTERBODY FUSION (TLIF) WITH PEDICLE SCREW FIXATION 1 LEVEL Right 03/24/2018   Procedure: RIGHT LUMBAR 4 - LUMBAR 5 TRANSFORAMINAL LUMBAR INTERBODY FUSION WITH INSTRUMENTATION AND ALLOGRAFT;  Surgeon: [place], MD;  Location: [facility] OR;  Service: Orthopedics;  Laterality: Right;   Family History  Problem Relation Age of Onset  . Breast cancer Sister 4  . Cancer Sister        breast   . Diabetes Sister   . Miscarriages / Stillbirths Sister   . Cancer Mother        breast   . Heart disease Mother   . Hypertension Mother   . Miscarriages / [place] Mother    . Heart failure Mother   . Multiple sclerosis Father   . Miscarriages / [place] Daughter   . Arthritis Maternal Grandmother   . Hyperlipidemia Maternal Grandmother   . Learning disabilities Maternal Grandmother   . Cancer Maternal Grandfather   . Diabetes Paternal Grandmother   . Arthritis Sister   . Miscarriages / [place] Sister    Social History   Socioeconomic History  . Marital status: Divorced    Spouse name: Not on file  . Number of children: Not on file  . Years of education: Not on file  . Highest education level: Not on file  Occupational History  . Not on file  Tobacco Use  . Smoking status: Former Smoker    Years: 30.00    Quit date: 2013    Years since quitting: 8.4  . Smokeless tobacco: Never Used  Substance and Sexual Activity  . Alcohol use: Yes    Alcohol/week: 7.0 standard drinks    Types: 7 Glasses of wine per week    Comment: per week  . Drug use: No  . Sexual activity: Not on file  Other Topics Concern  . Not on file  Social History Narrative   Associate degree    Wears seat belt, feels safe in marriage to Ron   1 daughter    Social Determinants of Health   Financial Resource Strain:   . Difficulty of Paying Living Expenses:   Food Insecurity:   . Worried About Programme researcher, broadcasting/film/video in the Last Year:   . Barista in the Last Year:   Transportation Needs:   . Freight forwarder (Medical):   [place] Lack of Transportation (Non-Medical):   Physical Activity:   . Days of Exercise per Week:   . Minutes of Exercise per Session:   Stress:   . Feeling of Stress :   Social Connections:   . Frequency of Communication with Friends and Family:   . Frequency of Social Gatherings with Friends and Family:   . Attends Religious Services:   . Active Member of Clubs or Organizations:   . Attends Banker Meetings:   Marland Kitchen Marital Status:     Outpatient Encounter Medications as of 07/27/2019  Medication Sig  . acetaminophen  (TYLENOL) 500 MG tablet Take 500 mg by mouth at bedtime.  Marland Kitchen albuterol (PROAIR HFA) 108 (90 Base) MCG/ACT inhaler Inhale 2 puffs into the lungs every 6 (six) hours as needed for wheezing or shortness of breath.  . Calcium Carbonate-Vitamin D (CALTRATE  600+D PO) Take 1 tablet by mouth 2 (two) times daily.   . cyclobenzaprine (FLEXERIL) 10 MG tablet Take 1 tablet (10 mg total) by mouth 2 (two) times daily as needed for muscle spasms.  Mar[place] Kitchen gabapentin (NEURONTIN) 300 MG capsule Take 1 capsule (300 mg total) by mouth 4 (four) times daily.  . meloxicam (MOBIC) 7.5 MG tablet Take 1 tablet (7.5 mg total) by mouth 2 (two) times daily as needed for pain.  . Multiple Vitamin (MULTIVITAMIN WITH MINERALS) TABS tablet Take 1 tablet by mouth 2 (two) times daily.   Marland Kitchen OVER THE COUNTER MEDICATION Take 1 tablet by mouth 2 (two) times daily. Estraval Hormone Supplement  . OVER THE COUNTER MEDICATION Take 2 tablets by mouth 2 (two) times daily. Replenex Dietary Supplement  . OVER THE COUNTER MEDICATION Take 1 tablet by mouth daily. Nutradew Vision Supplement  . OVER THE COUNTER MEDICATION Take 1 tablet by mouth daily. Unforgettable Cognitive Health Supplement  . verapamil (CALAN-SR) 240 MG CR tablet Take 1 tablet (240 mg total) by mouth at bedtime.   No facility-administered encounter medications on file as of 07/27/2019.    Activities of Daily Living In your present state of health, do you have any difficulty performing the following activities: 07/27/2019  Hearing? N  Vision? N  Difficulty concentrating or making decisions? N  Walking or climbing stairs? N  Dressing or bathing? N  Doing errands, shopping? N  Preparing Food and eating ? N  Using the Toilet? N  In the past six months, have you accidently leaked urine? N  Do you have problems with loss of bowel control? N  Managing your Medications? N  Managing your Finances? N  Housekeeping or managing your Housekeeping? N  Some recent data might be hidden     Patient Care Team: McLean-Scocuzza, Pasty Spillers, MD as PCP - General (Internal Medicine)    Assessment:   This is a routine wellness examination for Amy Grant.  I connected with Arnell Asal today by telephone and verified that I am speaking with the correct person using two identifiers. Location patient: home Location provider: work Persons participating in the virtual visit: patient, provider.   I discussed the limitations, risks, security and privacy concerns of performing an evaluation and management service by telephone and the availability of in person appointments. I also discussed with the patient that there may be a patient responsible charge related to this service. The patient expressed understanding and verbally consented to this telephonic visit.    Interactive audio and video telecommunications were attempted between this provider and patient, however failed, due to patient having technical difficulties OR patient did not have access to video capability.  We continued and completed visit with audio only.  Some vital signs may be absent or patient reported.   Patient is alert and oriented x3. Patient denies difficulty focusing or concentrating. Patient likes to read and play brain engagement for brain health.   Health Maintenance Due: -PNA and Tdap vaccine- discussed; to be completed with doctor in visit or local pharmacy. Understands not to receive vaccines within  -Colonoscopy/Cologuard- declined -Covid vaccine- patient is considering receiving. Phone number provided for scheduling [phone] See completed HM at the end of note.   Eye: Visual acuity not assessed. Virtual visit. Followed by their ophthalmologist.  Dental: Visits every 6 months.    Hearing: Demonstrates normal hearing during visit.  Safety:  Patient feels safe at home- yes Patient does have smoke detectors at home- yes Patient does wear  sunscreen or protective clothing when in direct sunlight -  yes Patient does wear seat belt when in a moving vehicle - yes Patient drives- yes Adequate lighting in walkways free from debris- yes Grab bars and handrails used as appropriate- yes Ambulates with an assistive device- no Cell phone on person when ambulating outside of the home- yes  Social: Alcohol intake - yes      Smoking history- former   Smokers in home? none Illicit drug use? none  Medication: Taking as directed and without issues.  Self managed - yes   Covid-19: Precautions and sickness symptoms discussed. Wears mask, social distancing, hand hygiene as appropriate.   Activities of Daily Living Patient denies needing assistance with: household chores, feeding themselves, getting from bed to chair, getting to the toilet, bathing/showering, dressing, managing money, or preparing meals.   Discussed the importance of a healthy diet, water intake and the benefits of aerobic exercise.   Physical activity- row machine every other day about 30 minutes  Diet:  Regular Water: good intake  Other Providers Patient Care Team: McLean-Scocuzza, Pasty Spillersracy N, MD as PCP - General (Internal Medicine)   Exercise Activities and Dietary recommendations  Current Exercise Habits: Home exercise routine, Intensity: Mild  Goals    . Follow up with PCP as needed     Healthy diet Stay active as tolerated       Fall Risk Fall Risk  07/27/2019 06/30/2019 12/24/2017  Falls in the past year? 0 0 No  Number falls in past yr: - 0 -  Injury with Fall? - 0 -  Follow up Falls evaluation completed Falls evaluation completed -   Timed Get Up and Go performed: no, virtual visit  Depression Screen PHQ 2/9 Scores 07/27/2019 06/30/2019 12/24/2017  PHQ - 2 Score 0 0 0     Cognitive Function MMSE - Mini Mental State Exam 12/24/2017  Orientation to time 5  Orientation to Place 5  Registration 3  Attention/ Calculation 5  Recall 3  Language- name 2 objects 2  Language- repeat 1  Language- follow  3 step command 3  Language- read & follow direction 1  Write a sentence 1  Copy design 1  Total score 30     6CIT Screen 07/27/2019  What Year? 0 points  What month? 0 points  What time? 0 points  Months in reverse 0 points     There is no immunization history on file for this patient. Screening Tests Health Maintenance  Topic Date Due  . COVID-19 Vaccine (1) Never done  . COLONOSCOPY  07/26/2020 (Originally 07/29/2002)  . TETANUS/TDAP  07/26/2020 (Originally 07/29/1969)  . PNA vac Low Risk Adult (1 of 2 - PCV13) 07/26/2020 (Originally 07/28/2017)  . INFLUENZA VACCINE  10/02/2019  . MAMMOGRAM  09/08/2020  . DEXA SCAN  Completed  . Hepatitis C Screening  Completed     Plan:    Keep all routine maintenance appointments.   Covid vaccine- patient is considering receiving. Phone number provided for scheduling [phone].  Medicare Attestation I have personally reviewed: The patient's medical and social history Their use of alcohol, tobacco or illicit drugs Their current medications and supplements The patient's functional ability including ADLs,fall risks, home safety risks, cognitive, and hearing and visual impairment Diet and physical activities Evidence for depression   I have reviewed and discussed with patient certain preventive protocols, quality metrics, and best practice recommendations.     Ashok PallOBrien-Blaney, Huckleberry Martinson L, LPN  0/98/11915/26/2021  Reviewed above information.  Agree with plan.   Dr Nicki Reaper

## 2019-08-09 ENCOUNTER — Ambulatory Visit (INDEPENDENT_AMBULATORY_CARE_PROVIDER_SITE_OTHER): Payer: Medicare Other | Admitting: Internal Medicine

## 2019-08-09 ENCOUNTER — Other Ambulatory Visit: Payer: Self-pay

## 2019-08-09 ENCOUNTER — Encounter: Payer: Self-pay | Admitting: Internal Medicine

## 2019-08-09 VITALS — BP 110/88 | HR 87 | Temp 96.9°F | Ht 65.0 in | Wt 140.6 lb

## 2019-08-09 DIAGNOSIS — R590 Localized enlarged lymph nodes: Secondary | ICD-10-CM | POA: Diagnosis not present

## 2019-08-09 DIAGNOSIS — H9201 Otalgia, right ear: Secondary | ICD-10-CM | POA: Diagnosis not present

## 2019-08-09 DIAGNOSIS — H6690 Otitis media, unspecified, unspecified ear: Secondary | ICD-10-CM

## 2019-08-09 DIAGNOSIS — B379 Candidiasis, unspecified: Secondary | ICD-10-CM

## 2019-08-09 MED ORDER — SALINE SPRAY 0.65 % NA SOLN: 1.0000 | Freq: Every day | NASAL | 11 refills | Status: DC | PRN

## 2019-08-09 MED ORDER — AMOXICILLIN-POT CLAVULANATE 875-125 MG PO TABS: 1.0000 | ORAL_TABLET | Freq: Two times a day (BID) | ORAL | 0 refills | Status: DC

## 2019-08-09 MED ORDER — FLUTICASONE PROPIONATE 50 MCG/ACT NA SUSP: 2.0000 | Freq: Every day | NASAL | 11 refills | Status: DC | PRN

## 2019-08-09 MED ORDER — CIPROFLOXACIN-DEXAMETHASONE 0.3-0.1 % OT SUSP: 4.0000 [drp] | Freq: Two times a day (BID) | OTIC | 0 refills | Status: DC

## 2019-08-09 MED ORDER — FLUCONAZOLE 150 MG PO TABS: 150.0000 mg | ORAL_TABLET | Freq: Once | ORAL | 0 refills | Status: AC

## 2019-08-09 MED ORDER — NEOMYCIN-POLYMYXIN-HC 3.5-10000-1 OT SOLN: 4.0000 [drp] | Freq: Four times a day (QID) | OTIC | 0 refills | Status: DC

## 2019-08-09 NOTE — Patient Instructions (Signed)
Try Claritin, Allegra, or Zyrtec take at night  Let me know if not better then we will do a CT scan neck for now ultrasound  Diflucan for yeast Augmentin for pain and ear drops (whatever one is cheapest   Call back Friday/Monday to let me know please how you are doing   Eustachian Tube Dysfunction  Eustachian tube dysfunction refers to a condition in which a blockage develops in the narrow passage that connects the middle ear to the back of the nose (eustachian tube). The eustachian tube regulates air pressure in the middle ear by letting air move between the ear and nose. It also helps to drain fluid from the middle ear space. Eustachian tube dysfunction can affect one or both ears. When the eustachian tube does not function properly, air pressure, fluid, or both can build up in the middle ear. What are the causes? This condition occurs when the eustachian tube becomes blocked or cannot open normally. Common causes of this condition include:  Ear infections.  Colds and other infections that affect the nose, mouth, and throat (upper respiratory tract).  Allergies.  Irritation from cigarette smoke.  Irritation from stomach acid coming up into the esophagus (gastroesophageal reflux). The esophagus is the tube that carries food from the mouth to the stomach.  Sudden changes in air pressure, such as from descending in an airplane or scuba diving.  Abnormal growths in the nose or throat, such as: ? Growths that line the nose (nasal polyps). ? Abnormal growth of cells (tumors). ? Enlarged tissue at the back of the throat (adenoids). What increases the risk? You are more likely to develop this condition if:  You smoke.  You are overweight.  You are a child who has: ? Certain birth defects of the mouth, such as cleft palate. ? Large tonsils or adenoids. What are the signs or symptoms? Common symptoms of this condition include:  A feeling of fullness in the ear.  Ear  pain.  Clicking or popping noises in the ear.  Ringing in the ear.  Hearing loss.  Loss of balance.  Dizziness. Symptoms may get worse when the air pressure around you changes, such as when you travel to an area of high elevation, fly on an airplane, or go scuba diving. How is this diagnosed? This condition may be diagnosed based on:  Your symptoms.  A physical exam of your ears, nose, and throat.  Tests, such as those that measure: ? The movement of your eardrum (tympanogram). ? Your hearing (audiometry). How is this treated? Treatment depends on the cause and severity of your condition.  In mild cases, you may relieve your symptoms by moving air into your ears. This is called "popping the ears."  In more severe cases, or if you have symptoms of fluid in your ears, treatment may include: ? Medicines to relieve congestion (decongestants). ? Medicines that treat allergies (antihistamines). ? Nasal sprays or ear drops that contain medicines that reduce swelling (steroids). ? A procedure to drain the fluid in your eardrum (myringotomy). In this procedure, a small tube is placed in the eardrum to:  Drain the fluid.  Restore the air in the middle ear space. ? A procedure to insert a balloon device through the nose to inflate the opening of the eustachian tube (balloon dilation). Follow these instructions at home: Lifestyle  Do not do any of the following until your health care provider approves: ? Travel to high altitudes. ? Fly in airplanes. ? Work in  a pressurized cabin or room. ? Scuba dive.  Do not use any products that contain nicotine or tobacco, such as cigarettes and e-cigarettes. If you need help quitting, ask your health care provider.  Keep your ears dry. Wear fitted earplugs during showering and bathing. Dry your ears completely after. General instructions  Take over-the-counter and prescription medicines only as told by your health care provider.  Use  techniques to help pop your ears as recommended by your health care provider. These may include: ? Chewing gum. ? Yawning. ? Frequent, forceful swallowing. ? Closing your mouth, holding your nose closed, and gently blowing as if you are trying to blow air out of your nose.  Keep all follow-up visits as told by your health care provider. This is important. Contact a health care provider if:  Your symptoms do not go away after treatment.  Your symptoms come back after treatment.  You are unable to pop your ears.  You have: ? A fever. ? Pain in your ear. ? Pain in your head or neck. ? Fluid draining from your ear.  Your hearing suddenly changes.  You become very dizzy.  You lose your balance. Summary  Eustachian tube dysfunction refers to a condition in which a blockage develops in the eustachian tube.  It can be caused by ear infections, allergies, inhaled irritants, or abnormal growths in the nose or throat.  Symptoms include ear pain, hearing loss, or ringing in the ears.  Mild cases are treated with maneuvers to unblock the ears, such as yawning or ear popping.  Severe cases are treated with medicines. Surgery may also be done (rare). This information is not intended to replace advice given to you by your health care provider. Make sure you discuss any questions you have with your health care provider. Document Revised: 06/09/2017 Document Reviewed: 06/09/2017 Elsevier Patient Education  [place], Adult An earache, or ear pain, can be caused by many things, including:  An infection.  Ear wax buildup.  Ear pressure.  Something in the ear that should not be there (foreign body).  A sore throat.  Tooth problems.  Jaw problems. Treatment of the earache will depend on the cause. If the cause is not clear or cannot be determined, you may need to watch your symptoms until your earache goes away or until a cause is found. Follow these  instructions at home: Medicines  Take or apply over-the-counter and prescription medicines only as told by your health care provider.  If you were prescribed an antibiotic medicine, use it as told by your health care provider. Do not stop using the antibiotic even if you start to feel better.  Do not put anything in your ear other than medicine that is prescribed by your health care provider. Managing pain If directed, apply heat to the affected area as often as told by your health care provider. Use the heat source that your health care provider recommends, such as a moist heat pack or a heating pad.  Place a towel between your skin and the heat source.  Leave the heat on for 20-30 minutes.  Remove the heat if your skin turns bright red. This is especially important if you are unable to feel pain, heat, or cold. You may have a greater risk of getting burned. If directed, put ice on the affected area as often as told by your health care provider. To do this:      Put ice in a plastic  bag.  Place a towel between your skin and the bag.  Leave the ice on for 20 minutes, 2-3 times a day. General instructions  Pay attention to any changes in your symptoms.  Try resting in an upright position instead of lying down. This may help to reduce pressure in your ear and relieve pain.  Chew gum if it helps to relieve your ear pain.  Treat any allergies as told by your health care provider.  Drink enough fluid to keep your urine pale yellow.  It is up to you to get the results of any tests that were done. Ask your health care provider, or the department that is doing the tests, when your results will be ready.  Keep all follow-up visits as told by your health care provider. This is important. Contact a health care provider if:  Your pain does not improve within 2 days.  Your earache gets worse.  You have new symptoms.  You have a fever. Get help right away if you:  Have a severe  headache.  Have a stiff neck.  Have trouble swallowing.  Have redness or swelling behind your ear.  Have fluid or blood coming from your ear.  Have hearing loss.  Feel dizzy. Summary  An earache, or ear pain, can be caused by many things.  Treatment of the earache will depend on the cause. Follow recommendations from your health care provider to treat your ear pain.  If the cause is not clear or cannot be determined, you may need to watch your symptoms until your earache goes away or until a cause is found.  Keep all follow-up visits as told by your health care provider. This is important. This information is not intended to replace advice given to you by your health care provider. Make sure you discuss any questions you have with your health care provider. Document Revised: 09/25/2018 Document Reviewed: 09/25/2018 Elsevier Patient Education  2020 [employer].

## 2019-08-09 NOTE — Progress Notes (Signed)
Chief Complaint  Patient presents with  . Ear Pain    Pt c/o right ear pain x2-3weeks. Describes pain as "Fluttering"   F/u  1. Right ear pain x 2-3 weeks aching and tender to touch radiates to right cheek/TMJ and right ear and behind her ear 10/10 at times aching throbbing tried debrox ear wax drops otc no dental issues just had dental appt and normal Xrays per pt no ringing in ears or hearing loss. Debrox ear wax drops did not help    Review of Systems  HENT: Positive for ear pain. Negative for hearing loss and tinnitus.   Respiratory: Negative for shortness of breath.   Cardiovascular: Negative for chest pain.  Skin:       +hair loss    Past Medical History:  Diagnosis Date  . Anxiety   . Arthritis    right hand, DDD L 4 , L 5  . Asthma   . Blood in stool   . Chronic intermittent post-traumatic headache   . Colon polyps   . Complication of anesthesia 2018   difficulty waking up   . Depression   . Family history of adverse reaction to anesthesia    Sister woke up during surgery  . GERD (gastroesophageal reflux disease)   . Head injury   . Headache    h/o migraines   . Hepatitis   . Hepatitis C    treated with mavyret per GI  . History of blood transfusion   . Hypertension   . Laryngopharyngeal reflux (LPR)   . Multiple falls   . Pancreatitis    remote hx w/o h/o drinking at that time  . Seizures (HCC)     after head injury  . Vitamin D deficiency    Past Surgical History:  Procedure Laterality Date  . ABDOMINAL HYSTERECTOMY  1979   has an ovary left s/p hysterectomy 1979; h/o abnormal pap   . acdf     08/07/16 Dr. Yevette Edwards   . ANTERIOR CERVICAL DECOMP/DISCECTOMY FUSION N/A 08/07/2016   Procedure: ANTERIOR CERVICAL DECOMPRESSION FUSION, CERVICAL FOUR-FIVE  WITH INSTRUMENTATION AND ALLOGRAFT;  Surgeon: Estill Bamberg, MD;  Location: [facility] OR;  Service: Orthopedics;  Laterality: N/A;  . APPENDECTOMY    . BREAST BIOPSY Left    over 20 years ago negative left breast   . CESAREAN SECTION    . COLON RESECTION     paritial ? reason done in [place]   . COLONOSCOPY    . EXPLORATORY LAPAROTOMY     after C Section  x 2 - 1 for bleeding and 1 for infection  . HERNIA REPAIR     Inguinal - as a child  . TONSILLECTOMY    . TRANSFORAMINAL LUMBAR INTERBODY FUSION (TLIF) WITH PEDICLE SCREW FIXATION 1 LEVEL Right 03/24/2018   Procedure: RIGHT LUMBAR 4 - LUMBAR 5 TRANSFORAMINAL LUMBAR INTERBODY FUSION WITH INSTRUMENTATION AND ALLOGRAFT;  Surgeon: Estill Bamberg, MD;  Location: [facility] OR;  Service: Orthopedics;  Laterality: Right;   Family History  Problem Relation Age of Onset  . Breast cancer Sister 38  . Cancer Sister        breast   . Diabetes Sister   . Miscarriages / Stillbirths Sister   . Cancer Mother        breast   . Heart disease Mother   . Hypertension Mother   . Miscarriages / [place] Mother   . Heart failure Mother   . Multiple sclerosis Father   . Miscarriages /  Stillbirths Daughter   . Arthritis Maternal Grandmother   . Hyperlipidemia Maternal Grandmother   . Learning disabilities Maternal Grandmother   . Cancer Maternal Grandfather   . Diabetes Paternal Grandmother   . Arthritis Sister   . Miscarriages / [place] Sister    Social History   Socioeconomic History  . Marital status: Divorced    Spouse name: Not on file  . Number of children: Not on file  . Years of education: Not on file  . Highest education level: Not on file  Occupational History  . Not on file  Tobacco Use  . Smoking status: Former Smoker    Years: 30.00    Quit date: 2013    Years since quitting: 8.4  . Smokeless tobacco: Never Used  Substance and Sexual Activity  . Alcohol use: Yes    Alcohol/week: 7.0 standard drinks    Types: 7 Glasses of wine per week    Comment: per week  . Drug use: No  . Sexual activity: Not on file  Other Topics Concern  . Not on file  Social History Narrative   Associate degree    Wears seat belt, feels safe in marriage  to Ron   1 daughter    Social Determinants of Health   Financial Resource Strain:   . Difficulty of Paying Living Expenses:   Food Insecurity:   . Worried About Programme researcher, broadcasting/film/video in the Last Year:   . Barista in the Last Year:   Transportation Needs:   . Freight forwarder (Medical):   Marland Kitchen Lack of Transportation (Non-Medical):   Physical Activity:   . Days of Exercise per Week:   . Minutes of Exercise per Session:   Stress:   . Feeling of Stress :   Social Connections:   . Frequency of Communication with Friends and Family:   . Frequency of Social Gatherings with Friends and Family:   . Attends Religious Services:   . Active Member of Clubs or Organizations:   . Attends Banker Meetings:   Marland Kitchen Marital Status:   Intimate Partner Violence:   . Fear of Current or Ex-Partner:   . Emotionally Abused:   Marland Kitchen Physically Abused:   . Sexually Abused:    Current Meds  Medication Sig  . acetaminophen (TYLENOL) 500 MG tablet Take 500 mg by mouth at bedtime.  Marland Kitchen albuterol (PROAIR HFA) 108 (90 Base) MCG/ACT inhaler Inhale 2 puffs into the lungs every 6 (six) hours as needed for wheezing or shortness of breath.  . Calcium Carbonate-Vitamin D (CALTRATE 600+D PO) Take 1 tablet by mouth 2 (two) times daily.   . cyclobenzaprine (FLEXERIL) 10 MG tablet Take 1 tablet (10 mg total) by mouth 2 (two) times daily as needed for muscle spasms.  Marland Kitchen gabapentin (NEURONTIN) 300 MG capsule Take 1 capsule (300 mg total) by mouth 4 (four) times daily.  . meloxicam (MOBIC) 7.5 MG tablet Take 1 tablet (7.5 mg total) by mouth 2 (two) times daily as needed for pain.  . Multiple Vitamin (MULTIVITAMIN WITH MINERALS) TABS tablet Take 1 tablet by mouth 2 (two) times daily.   Marland Kitchen OVER THE COUNTER MEDICATION Take 1 tablet by mouth 2 (two) times daily. Estraval Hormone Supplement  . OVER THE COUNTER MEDICATION Take 2 tablets by mouth 2 (two) times daily. Replenex Dietary Supplement  . OVER THE COUNTER  MEDICATION Take 1 tablet by mouth daily. Nutradew Vision Supplement  . OVER THE COUNTER MEDICATION Take  1 tablet by mouth daily. Unforgettable Cognitive Health Supplement  . verapamil (CALAN-SR) 240 MG CR tablet Take 1 tablet (240 mg total) by mouth at bedtime.   Allergies  Allergen Reactions  . Latex Hives  . Sulfa Antibiotics Hives  . Linaclotide Itching   No results found for this or any previous visit (from the past 2160 hour(s)). Objective  Body mass index is 23.4 kg/m. Wt Readings from Last 3 Encounters:  08/09/19 140 lb 9.6 oz (63.8 kg)  07/27/19 141 lb (64 kg)  06/30/19 141 lb 6.4 oz (64.1 kg)   Temp Readings from Last 3 Encounters:  08/09/19 (!) 96.9 F (36.1 C)  06/30/19 (!) 97.3 F (36.3 C) (Temporal)  03/25/18 97.7 F (36.5 C) (Oral)   BP Readings from Last 3 Encounters:  08/09/19 110/88  06/30/19 118/80  03/25/18 140/84   Pulse Readings from Last 3 Encounters:  08/09/19 87  06/30/19 94  03/25/18 85    Physical Exam Vitals and nursing note reviewed.  Constitutional:      Appearance: Normal appearance. She is well-developed and well-groomed.  HENT:     Head: Normocephalic and atraumatic.     Comments: B/l fluid in ears with ETD+ Mild wax in right ear  None in left  Eyes:     Conjunctiva/sclera: Conjunctivae normal.     Pupils: Pupils are equal, round, and reactive to light.  Cardiovascular:     Rate and Rhythm: Normal rate and regular rhythm.     Heart sounds: Normal heart sounds. No murmur.  Pulmonary:     Effort: Pulmonary effort is normal.     Breath sounds: Normal breath sounds.  Skin:    General: Skin is warm and dry.     Comments: +hair loss   Neurological:     General: No focal deficit present.     Mental Status: She is alert and oriented to person, place, and time. Mental status is at baseline.     Gait: Gait normal.  Psychiatric:        Attention and Perception: Attention and perception normal.        Mood and Affect: Mood and  affect normal.        Speech: Speech normal.        Behavior: Behavior normal. Behavior is cooperative.        Thought Content: Thought content normal.        Cognition and Memory: Cognition and memory normal.        Judgment: Judgment normal.     Assessment  Plan  Cervical lymphadenopathy - Plan: US SOFT TISSUE HEAD & NECK (NON-THYROID) If still sx'matic consider CT neck and MRI brain IAC w and w/o contrast   Right ear pain OM vs ETD vs other radiated pain from cervical lymphadenopathy - Plan: ciprofloxacin-dexamethasone (CIPRODEX) OTIC suspension, neomycin-polymyxin-hydrocortisone (CORTISPORIN) OTIC solution, fluticasone (FLONASE) 50 MCG/ACT nasal spray, sodium chloride (OCEAN) 0.65 % SOLN nasal spray  amoxicillin-clavulanate (AUGMENTIN) 875-125 MG tablet Consider further imaging above if [place] negative   Yeast infection - Plan: fluconazole (DIFLUCAN) 150 MG tablet  Prn   HM Declines flu vx  Disc Tdap vaccine and recgiven Tdap Rx today to get at pharmacy Consider shingrix in future will need to disc Consider prevnar and pna 23 vaccines in future Consider hep B vx in future covid vaccine not going to get she is unsure   Colonoscopy 2015 get records from [facility]  Last pap2/9/17 negative likely insurance will not cover another has medicare  -  s/p hysterectomy with h/o cervical changes age 62 1 ovary intact DEXA 09/09/2018 -1.8 to -1.9 osteopenia [facility]  mammo 09/09/2018 normal [facility] negative ordered repeat  -h/o breast cyst per pt and asymmetries per last mammogram + FH breast cancer mother and sister pt agreeable -[facility]  -agreeable to [facility] and DEXA in future after covid 19 order in  Former smoker 30 years quit in 2010 1-1.5 pk would last 1 week no FH lung cancer. -calc risk score 2.4% consider CT chest lung cancer screening in future disc at f/u  Provider: Dr. Olivia Mackie McLean-Scocuzza-Internal Medicine

## 2019-08-12 ENCOUNTER — Telehealth: Payer: Self-pay | Admitting: Internal Medicine

## 2019-08-12 NOTE — Telephone Encounter (Signed)
Please advise, Patient seen 08/09/19

## 2019-08-12 NOTE — Telephone Encounter (Signed)
Pt called in to give report on ear and appt with imaging on Thursday 6-17 at 3pm  She said that she is still having pain in the jaw close to the right ear

## 2019-08-12 NOTE — Addendum Note (Signed)
Addended by: Quentin Ore on: 08/12/2019 05:57 PM   Modules accepted: Orders

## 2019-08-12 NOTE — Telephone Encounter (Signed)
Will refer to ENT urgently

## 2019-08-13 ENCOUNTER — Ambulatory Visit: Payer: Medicare Other | Attending: Oncology

## 2019-08-13 ENCOUNTER — Other Ambulatory Visit: Payer: Self-pay

## 2019-08-13 DIAGNOSIS — Z23 Encounter for immunization: Secondary | ICD-10-CM

## 2019-08-13 NOTE — Progress Notes (Signed)
   Covid-19 Vaccination Clinic  Name:  Amy Grant    MRN: [id number] DOB: 02/16/1951  08/13/2019  Amy Grant was observed post Covid-19 immunization for 15 minutes without incident. She was provided with Vaccine Information Sheet and instruction to access the V-Safe system.   Amy Grant was instructed to call 911 with any severe reactions post vaccine: Marland Kitchen Difficulty breathing  . Swelling of face and throat  . A fast heartbeat  . A bad rash all over body  . Dizziness and weakness   Immunizations Administered    Name Date Dose VIS Date Route   Pfizer COVID-19 Vaccine 08/13/2019 10:38 AM 0.3 mL 04/27/2018 Intranasal   Manufacturer: [employer], [employer]   Lot: [id number]   NDC: [address]-9

## 2019-08-15 NOTE — Telephone Encounter (Signed)
Patient informed and verbalized understanding

## 2019-08-18 ENCOUNTER — Other Ambulatory Visit: Payer: Self-pay

## 2019-08-18 ENCOUNTER — Ambulatory Visit
Admission: RE | Admit: 2019-08-18 | Discharge: 2019-08-18 | Disposition: A | Discharge status: Home / Self Care | Payer: Medicare Other | Source: Ambulatory Visit | Attending: Internal Medicine | Admitting: Internal Medicine

## 2019-08-18 DIAGNOSIS — R590 Localized enlarged lymph nodes: Secondary | ICD-10-CM | POA: Diagnosis present

## 2019-09-10 ENCOUNTER — Ambulatory Visit: Payer: Medicare Other | Attending: Internal Medicine

## 2019-09-10 DIAGNOSIS — Z23 Encounter for immunization: Secondary | ICD-10-CM

## 2019-09-10 NOTE — Progress Notes (Signed)
   Covid-19 Vaccination Clinic  Name:  Amy Grant    MRN: [id number] DOB: [birth year]  09/10/2019  [name] was observed post Covid-19 immunization for 15 minutes without incident. She was provided with Vaccine Information Sheet and instruction to access the V-Safe system.   [name] was instructed to call 911 with any severe reactions post vaccine: Marland Kitchen Difficulty breathing  . Swelling of face and throat  . A fast heartbeat  . A bad rash all over body  . Dizziness and weakness   Immunizations Administered    Name Date Dose VIS Date Route   Pfizer COVID-19 Vaccine 09/10/2019 10:08 AM 0.3 mL 04/27/2018 Intramuscular   Manufacturer: [employer], [employer]   Lot: [id number]   NDC: [address]-5

## 2019-09-16 ENCOUNTER — Other Ambulatory Visit: Payer: Medicare Other

## 2019-09-16 ENCOUNTER — Other Ambulatory Visit (INDEPENDENT_AMBULATORY_CARE_PROVIDER_SITE_OTHER): Payer: Medicare Other

## 2019-09-16 ENCOUNTER — Other Ambulatory Visit: Payer: Self-pay

## 2019-09-16 DIAGNOSIS — B1920 Unspecified viral hepatitis C without hepatic coma: Secondary | ICD-10-CM

## 2019-09-16 DIAGNOSIS — Z1329 Encounter for screening for other suspected endocrine disorder: Secondary | ICD-10-CM | POA: Diagnosis not present

## 2019-09-16 DIAGNOSIS — E611 Iron deficiency: Secondary | ICD-10-CM | POA: Diagnosis not present

## 2019-09-16 DIAGNOSIS — I10 Essential (primary) hypertension: Secondary | ICD-10-CM

## 2019-09-16 DIAGNOSIS — Z1389 Encounter for screening for other disorder: Secondary | ICD-10-CM

## 2019-09-16 DIAGNOSIS — R7989 Other specified abnormal findings of blood chemistry: Secondary | ICD-10-CM | POA: Diagnosis not present

## 2019-09-16 LAB — LIPID PANEL
Cholesterol: 165 mg/dL (ref 0–200)
HDL: 68.6 mg/dL (ref >39.00)
LDL Cholesterol: 81 mg/dL (ref 0–99)
NonHDL: 96.42
Total CHOL/HDL Ratio: 2
Triglycerides: 76 mg/dL (ref 0.0–149.0)
VLDL: 15.2 mg/dL (ref 0.0–40.0)

## 2019-09-16 LAB — COMPREHENSIVE METABOLIC PANEL
ALT: 17 U/L (ref 0–35)
AST: 22 U/L (ref 0–37)
Albumin: 4.4 g/dL (ref 3.5–5.2)
Alkaline Phosphatase: 73 U/L (ref 39–117)
BUN: 17 mg/dL (ref 6–23)
CO2: 28 mEq/L (ref 19–32)
Calcium: 9.2 mg/dL (ref 8.4–10.5)
Chloride: 104 mEq/L (ref 96–112)
Creatinine, Ser: 1.1 mg/dL (ref 0.40–1.20)
GFR: 59.92 mL/min — ABNORMAL LOW (ref >60.00)
Glucose, Bld: 94 mg/dL (ref 70–99)
Potassium: 3.9 mEq/L (ref 3.5–5.1)
Sodium: 140 mEq/L (ref 135–145)
Total Bilirubin: 0.9 mg/dL (ref 0.2–1.2)
Total Protein: 6.5 g/dL (ref 6.0–8.3)

## 2019-09-16 LAB — CBC WITH DIFFERENTIAL/PLATELET
Basophils Absolute: 0 10*3/uL (ref 0.0–0.1)
Basophils Relative: 0.9 % (ref 0.0–3.0)
Eosinophils Absolute: 0.4 10*3/uL (ref 0.0–0.7)
Eosinophils Relative: 6.8 % — ABNORMAL HIGH (ref 0.0–5.0)
HCT: 36 % (ref 36.0–46.0)
Hemoglobin: 12.2 g/dL (ref 12.0–15.0)
Lymphocytes Relative: 37.8 % (ref 12.0–46.0)
Lymphs Abs: 2.2 10*3/uL (ref 0.7–4.0)
MCHC: 34 g/dL (ref 30.0–36.0)
MCV: 91 fl (ref 78.0–100.0)
Monocytes Absolute: 0.6 10*3/uL (ref 0.1–1.0)
Monocytes Relative: 10.3 % (ref 3.0–12.0)
Neutro Abs: 2.5 10*3/uL (ref 1.4–7.7)
Neutrophils Relative %: 44.2 % (ref 43.0–77.0)
Platelets: 188 10*3/uL (ref 150.0–400.0)
RBC: 3.96 Mil/uL (ref 3.87–5.11)
RDW: 13.2 % (ref 11.5–15.5)
WBC: 5.7 10*3/uL (ref 4.0–10.5)

## 2019-09-16 LAB — TSH: TSH: 1.79 u[IU]/mL (ref 0.35–4.50)

## 2019-09-16 NOTE — Addendum Note (Signed)
Addended by: Bonnell Public I on: 09/16/2019 10:59 AM   Modules accepted: Orders

## 2019-09-16 NOTE — Addendum Note (Signed)
Addended by: Warden Fillers on: 09/16/2019 12:37 PM   Modules accepted: Orders

## 2019-09-17 LAB — URINALYSIS, ROUTINE W REFLEX MICROSCOPIC
Bilirubin Urine: NEGATIVE
Glucose, UA: NEGATIVE
Hgb urine dipstick: NEGATIVE
Ketones, ur: NEGATIVE
Leukocytes,Ua: NEGATIVE
Nitrite: NEGATIVE
Protein, ur: NEGATIVE
Specific Gravity, Urine: 1.014 (ref 1.001–1.03)
pH: 6.5 (ref 5.0–8.0)

## 2019-09-17 LAB — IRON,TIBC AND FERRITIN PANEL
%SAT: 32 % (calc) (ref 16–45)
Ferritin: 74 ng/mL (ref 16–288)
Iron: 91 ug/dL (ref 45–160)
TIBC: 284 mcg/dL (calc) (ref 250–450)

## 2019-09-20 ENCOUNTER — Telehealth: Payer: Self-pay | Admitting: *Deleted

## 2019-09-20 NOTE — Telephone Encounter (Signed)
LVM to call our office for an appt w/ Dr. Servando Snare. Hep C treatment

## 2019-09-20 NOTE — Telephone Encounter (Signed)
-----   Message from Rayann Heman, CMA sent at 09/20/2019 12:00 PM EDT ----- Can you schedule her for her Hep C treatment completed appt with Dr. Servando Snare. It can be at his next available in September.   Thank you! ----- Message ----- From: Midge Minium, MD Sent: 09/19/2019   9:42 AM EDT To: Rayann Heman, CMA   ----- Message ----- From: McLean-Scocuzza, Pasty Spillers, MD Sent: 09/18/2019   5:24 PM EDT To: Midge Minium, MD, Tilford Pillar, CMA  Urine normal  Iron normal  Thyroid normal  Cholesterol normal  Overall kidney #s slightly increased  -rec avoid nsaids increase water intake to 55-64 ounces water daily  Blood cts normal except eosinophils which could indicate allergies  Dr. Servando Snare Pt has not had f/u with GI for hep C which was treated 08/2017 please have your office call her to schedule if needed

## 2019-09-20 NOTE — Telephone Encounter (Signed)
-----   Message from Ginger Feldpausch, CMA sent at 09/20/2019 12:00 PM EDT ----- Can you schedule her for her Hep C treatment completed appt with Dr. Wohl. It can be at his next available in September.   Thank you! ----- Message ----- From: Wohl, Darren, MD Sent: 09/19/2019   9:42 AM EDT To: Ginger Feldpausch, CMA   ----- Message ----- From: McLean-Scocuzza, Tracy N, MD Sent: 09/18/2019   5:24 PM EDT To: Darren Wohl, MD, Arianna J Miles, CMA  Urine normal  Iron normal  Thyroid normal  Cholesterol normal  Overall kidney #s slightly increased  -rec avoid nsaids increase water intake to 55-64 ounces water daily  Blood cts normal except eosinophils which could indicate allergies  Dr. Wohl Pt has not had f/u with GI for hep C which was treated 08/2017 please have your office call her to schedule if needed     

## 2019-09-21 ENCOUNTER — Encounter: Payer: Self-pay | Admitting: *Deleted

## 2019-09-22 DIAGNOSIS — Z1231 Encounter for screening mammogram for malignant neoplasm of breast: Secondary | ICD-10-CM | POA: Diagnosis not present

## 2019-09-22 LAB — HM MAMMOGRAPHY

## 2019-09-26 ENCOUNTER — Other Ambulatory Visit: Payer: Self-pay

## 2019-09-27 ENCOUNTER — Encounter: Payer: Self-pay | Admitting: Internal Medicine

## 2019-09-28 ENCOUNTER — Ambulatory Visit: Payer: Medicare Other | Admitting: Internal Medicine

## 2019-09-29 ENCOUNTER — Encounter: Payer: Self-pay | Admitting: Internal Medicine

## 2019-09-29 ENCOUNTER — Other Ambulatory Visit: Payer: Self-pay

## 2019-09-29 ENCOUNTER — Ambulatory Visit (INDEPENDENT_AMBULATORY_CARE_PROVIDER_SITE_OTHER): Payer: Medicare Other | Admitting: Internal Medicine

## 2019-09-29 VITALS — BP 122/80 | HR 84 | Temp 98.3°F | Ht 65.0 in | Wt 140.6 lb

## 2019-09-29 DIAGNOSIS — R040 Epistaxis: Secondary | ICD-10-CM | POA: Diagnosis not present

## 2019-09-29 DIAGNOSIS — J339 Nasal polyp, unspecified: Secondary | ICD-10-CM | POA: Diagnosis not present

## 2019-09-29 DIAGNOSIS — S0300XD Dislocation of jaw, unspecified side, subsequent encounter: Secondary | ICD-10-CM | POA: Diagnosis not present

## 2019-09-29 DIAGNOSIS — S0300XA Dislocation of jaw, unspecified side, initial encounter: Secondary | ICD-10-CM | POA: Insufficient documentation

## 2019-09-29 DIAGNOSIS — J3489 Other specified disorders of nose and nasal sinuses: Secondary | ICD-10-CM

## 2019-09-29 MED ORDER — MUPIROCIN 2 % EX OINT: 1.0000 "application " | TOPICAL_OINTMENT | Freq: Two times a day (BID) | CUTANEOUS | 0 refills | Status: DC

## 2019-09-29 NOTE — Progress Notes (Signed)
Patient presenting with what she thinks may be a nasal polyp. Has been having bleeding, soreness, and a growth in the right nostril. Has tried cleaning her nose and using nasal spray but nothing works.

## 2019-09-29 NOTE — Patient Instructions (Addendum)
Please f/u with Dr. Wyn Forster PA Ear Nose [facility]ro[facility] for your nose issues  10/07/19 2 PM Meta Hatchet PA-C  Nosebleed, Adult A nosebleed is when blood comes out of the nose. Nosebleeds are common. Usually, they are not a sign of a serious condition. Nosebleeds can happen if a small blood vessel in your nose starts to bleed or if the lining of your nose (mucous membrane) cracks. They are commonly caused by:  Allergies.  Colds.  Picking your nose.  Blowing your nose too hard.  An injury from sticking an object into your nose or getting hit in the nose.  Dry or cold air. Less common causes of nosebleeds include:  Toxic fumes.  Something abnormal in the nose or in the air-filled spaces in the bones of the face (sinuses).  Growths in the nose, such as polyps.  Medicines or conditions that cause blood to clot slowly.  Certain illnesses or procedures that irritate or dry out the nasal passages. Follow these instructions at home: When you have a nosebleed:   Sit down and tilt your head slightly forward.  Use a clean towel or tissue to pinch your nostrils under the bony part of your nose. After 10 minutes, let go of your nose and see if bleeding starts again. Do not release pressure before that time. If there is still bleeding, repeat the pinching and holding for 10 minutes until the bleeding stops.  Do not place tissues or gauze in the nose to stop bleeding.  Avoid lying down and avoid tilting your head backward. That may make blood collect in the throat and cause gagging or coughing.  Use a nasal spray decongestant to help with a nosebleed as told by your health care provider.  Do not use petroleum jelly or mineral oil in your nose. It can drip into your lungs. After a nosebleed:  Avoid blowing your nose or sniffing for a number of hours.  Avoid straining, lifting, or bending at the waist for several days. You may resume other normal activities as you are able.  Use saline  spray or a humidifier as told by your health care provider.  Aspirinand blood thinners make bleeding more likely. If you are prescribed these medicines and you suffer from nosebleeds: ? Ask your health care provider if you should stop taking the medicines or if you should adjust the dose. ? Do not stop taking medicines that your health care provider has recommended unless told by your health care provider.  If your nosebleed was caused by dry mucous membranes, use over-the-counter saline nasal spray or gel. This will keep the mucous membranes moist and allow them to heal. If you must use a lubricant: ? Choose one that is water-soluble. ? Use only as much as you need and use it only as often as needed. ? Do not lie down until several hours after you use it. Contact a health care provider if:  You have a fever.  You get nosebleeds often or more often than usual.  You bruise very easily.  You have a nosebleed from having something stuck in your nose.  You have bleeding in your mouth.  You vomit or cough up brown material.  You have a nosebleed after you start a new medicine. Get help right away if:  You have a nosebleed after a fall or a head injury.  Your nosebleed does not go away after 20 minutes.  You feel dizzy or weak.  You have unusual bleeding from other  parts of your body.  You have unusual bruising on other parts of your body.  You become sweaty.  You vomit blood. This information is not intended to replace advice given to you by your health care provider. Make sure you discuss any questions you have with your health care provider. Document Revised: 05/19/2017 Document Reviewed: 09/04/2015 Elsevier Patient Education  2020 [employer]  Temporomandibular Joint Syndrome  Temporomandibular joint syndrome (TMJ syndrome) is a condition that causes pain in the temporomandibular joints. These joints are located near your ears and allow your jaw to open and close. For  people with TMJ syndrome, chewing, biting, or other movements of the jaw can be difficult or painful. TMJ syndrome is often mild and goes away within a few weeks. However, sometimes the condition becomes a long-term (chronic) problem. What are the causes? This condition may be caused by:  Grinding your teeth or clenching your jaw. Some people do this when they are under stress.  Arthritis.  Injury to the jaw.  Head or neck injury.  Teeth or dentures that are not aligned well. In some cases, the cause of TMJ syndrome may not be known. What are the signs or symptoms? The most common symptom of this condition is an aching pain on the side of the head in the area of the TMJ. Other symptoms may include:  Pain when moving your jaw, such as when chewing or biting.  Being unable to open your jaw all the way.  Making a clicking sound when you open your mouth.  Headache.  Earache.  Neck or shoulder pain. How is this diagnosed? This condition may be diagnosed based on:  Your symptoms and medical history.  A physical exam. Your health care provider may check the range of motion of your jaw.  Imaging tests, such as X-rays or an MRI. You may also need to see your dentist, who will determine if your teeth and jaw are lined up correctly. How is this treated? TMJ syndrome often goes away on its own. If treatment is needed, the options may include:  Eating soft foods and applying ice or heat.  Medicines to relieve pain or inflammation.  Medicines or massage to relax the muscles.  A splint, bite plate, or mouthpiece to prevent teeth grinding or jaw clenching.  Relaxation techniques or counseling to help reduce stress.  A therapy for pain in which an electrical current is applied to the nerves through the skin (transcutaneous electrical nerve stimulation).  Acupuncture. This is sometimes helpful to relieve pain.  Jaw surgery. This is rarely needed. Follow these instructions at  home:  Eating and drinking  Eat a soft diet if you are having trouble chewing.  Avoid foods that require a lot of chewing. Do not chew gum. General instructions  Take over-the-counter and prescription medicines only as told by your health care provider.  If directed, put ice on the painful area. ? Put ice in a plastic bag. ? Place a towel between your skin and the bag. ? Leave the ice on for 20 minutes, 2-3 times a day.  Apply a warm, wet cloth (warm compress) to the painful area as directed.  Massage your jaw area and do any jaw stretching exercises as told by your health care provider.  If you were given a splint, bite plate, or mouthpiece, wear it as told by your health care provider.  Keep all follow-up visits as told by your health care provider. This is important. Contact a health  care provider if:  You are having trouble eating.  You have new or worsening symptoms. Get help right away if:  Your jaw locks open or closed. Summary  Temporomandibular joint syndrome (TMJ syndrome) is a condition that causes pain in the temporomandibular joints. These joints are located near your ears and allow your jaw to open and close.  TMJ syndrome is often mild and goes away within a few weeks. However, sometimes the condition becomes a long-term (chronic) problem.  Symptoms include an aching pain on the side of the head in the area of the TMJ, pain when chewing or biting, and being unable to open your jaw all the way. You may also make a clicking sound when you open your mouth.  TMJ syndrome often goes away on its own. If treatment is needed, it may include medicines to relieve pain, reduce inflammation, or relax the muscles. A splint, bite plate, or mouthpiece may also be used to prevent teeth grinding or jaw clenching. This information is not intended to replace advice given to you by your health care provider. Make sure you discuss any questions you have with your health care  provider. Document Revised: 05/01/2017 Document Reviewed: 03/31/2017 Elsevier Patient Education  2020 [employer]  Jaw Range of Motion Exercises Jaw range of motion exercises are exercises that help your jaw move better. Exercises that help you have good posture (postural exercises) also help relieve jaw discomfort. These are often done along with range of motion exercises. These exercises can help prevent or improve:  Difficulty opening your mouth.  Pain in your jaw while it is open or closed.  Temporomandibular joint (TMJ) pain.  Headache caused by jaw tension. Take other actions to prevent or relieve jaw pain, such as:  Avoiding things that cause or increase jaw pain. This may include: ? Chewing gum or eating hard foods. ? Clenching your jaw or teeth, grinding your teeth, or keeping tension in your jaw muscles. ? Opening your mouth wide, such as for a big yawn. ? Leaning on your jaw, such as resting your jaw in your hand while leaning on a desk.  Putting ice on your jaw. ? Put ice in a plastic bag. ? Place a towel between your skin and the bag. ? Leave the ice on for 10-15 minutes, 2-3 times a day. Only do jaw exercises that your health care provider approves of. Only move your jaw as far as it can comfortably go in each direction. Do not move your jaw into positions that cause pain. Range of motion exercises Repeat each of these exercises 8 times, 1-2 times a day, or as told by your health care provider. Exercise A: Forward protrusion 1. Push your jaw forward. Hold this position for 1-2 seconds. 2. Allow your jaw to return to its normal position and rest it there for 1-2 seconds. Exercise B: Controlled opening 1. Stand or sit in front of a mirror. Place your tongue on the roof of your mouth, just behind your top teeth. 2. Keeping your tongue on the roof of your mouth, slowly open and close your mouth. 3. While you open and close your mouth, watch your jaw in the mirror. Try  to keep your jaw from moving to one side or the other. Exercise C: Right and left motion 1. Move your jaw right. Hold this position for 1-2 seconds. Allow your jaw to return to its normal position, and rest it there for 1-2 seconds. 2. Move your jaw left. Hold  this position for 1-2 seconds. Allow your jaw to return to its normal position, and rest it there for 1-2 seconds. Postural exercises Exercise A: Chin tucks 1. You can do this exercise sitting, standing, or lying down. 2. Move your head straight back, keeping your head level. You can guide the movement by placing your fingers on your chin to push your jaw back in an even motion. You should be able to feel a double chin form at the end of the motion. 3. Hold this position for 5 seconds. Repeat 10-15 times. Exercise B: Shoulder blade squeeze 1. Sit or stand. 2. Bend your elbows to about 90 degrees, which is the shape of a capital letter "L." Keep your upper arms by your body. 3. Squeeze your shoulder blades down and back, as though you were trying to touch your elbows behind you. Do not shrug your shoulders or move your head. 4. Hold this position for 5 seconds. Repeat 10-15 times. Exercise C: Chest stretch 1. Stand facing a corner. 2. Put both of your hands and your forearms on the wall, with your arms wide apart. 3. Make sure your arms are at a 90-degree angle to your body. This means that you should hold your arms straight out from your body, level with the floor. 4. Step in toward the corner. Do not lean in. 5. Hold this position for 30 seconds. Repeat 3 times. Contact a health care provider if you have:  Jaw pain that is new or gets worse.  Clicking or popping sounds while doing the exercises. Get help right away if:  Your jaw is stuck in one place and you cannot move it.  You cannot open or close your mouth. This information is not intended to replace advice given to you by your health care provider. Make sure you discuss any  questions you have with your health care provider. Document Revised: 06/11/2018 Document Reviewed: 01/14/2017 Elsevier Patient Education  2020 [employer].

## 2019-09-29 NOTE — Progress Notes (Signed)
Chief Complaint  Patient presents with  . Nose Problem   F/u  1. Right nose pain and pt c/w polyp and nose bleeds tried nose sprays which have not helped and not sure when nose will bleed lateral nostril if tender to touch 2. Saw Dr. Elenore Rota dx'ed TMJ right >left given exercises to do   Review of Systems  HENT: Positive for nosebleeds.        +nose pain  Eyes: Negative for blurred vision.  Respiratory: Negative for shortness of breath.   Cardiovascular: Negative for chest pain.   Past Medical History:  Diagnosis Date  . Anxiety   . Arthritis    right hand, DDD L 4 , L 5  . Asthma   . Blood in stool   . Chronic intermittent post-traumatic headache   . Colon polyps   . Complication of anesthesia 2018   difficulty waking up   . Depression   . Family history of adverse reaction to anesthesia    Sister woke up during surgery  . GERD (gastroesophageal reflux disease)   . Head injury   . Headache    h/o migraines   . Hepatitis   . Hepatitis C    treated with mavyret per GI  . History of blood transfusion   . Hypertension   . Laryngopharyngeal reflux (LPR)   . Multiple falls   . Pancreatitis    remote hx w/o h/o drinking at that time  . Seizures (HCC)     after head injury  . Vitamin D deficiency    Past Surgical History:  Procedure Laterality Date  . ABDOMINAL HYSTERECTOMY  1979   has an ovary left s/p hysterectomy 1979; h/o abnormal pap   . acdf     08/07/16 Dr. Yevette Edwards   . ANTERIOR CERVICAL DECOMP/DISCECTOMY FUSION N/A 08/07/2016   Procedure: ANTERIOR CERVICAL DECOMPRESSION FUSION, CERVICAL FOUR-FIVE  WITH INSTRUMENTATION AND ALLOGRAFT;  Surgeon: Estill Bamberg, MD;  Location: [facility] OR;  Service: Orthopedics;  Laterality: N/A;  . APPENDECTOMY    . BREAST BIOPSY Left    over 20 years ago negative left breast  . CESAREAN SECTION    . COLON RESECTION     paritial ? reason done in [place]   . COLONOSCOPY    . EXPLORATORY LAPAROTOMY     after C Section  x 2 - 1 for  bleeding and 1 for infection  . HERNIA REPAIR     Inguinal - as a child  . TONSILLECTOMY    . TRANSFORAMINAL LUMBAR INTERBODY FUSION (TLIF) WITH PEDICLE SCREW FIXATION 1 LEVEL Right 03/24/2018   Procedure: RIGHT LUMBAR 4 - LUMBAR 5 TRANSFORAMINAL LUMBAR INTERBODY FUSION WITH INSTRUMENTATION AND ALLOGRAFT;  Surgeon: Estill Bamberg, MD;  Location: [facility] OR;  Service: Orthopedics;  Laterality: Right;   Family History  Problem Relation Age of Onset  . Breast cancer Sister 57  . Cancer Sister        breast   . Diabetes Sister   . Miscarriages / Stillbirths Sister   . Cancer Mother        breast   . Heart disease Mother   . Hypertension Mother   . Miscarriages / [place] Mother   . Heart failure Mother   . Multiple sclerosis Father   . Miscarriages / [place] Daughter   . Arthritis Maternal Grandmother   . Hyperlipidemia Maternal Grandmother   . Learning disabilities Maternal Grandmother   . Cancer Maternal Grandfather   . Diabetes Paternal Grandmother   .  Arthritis Sister   . Miscarriages / [place] Sister    Social History   Socioeconomic History  . Marital status: Divorced    Spouse name: Not on file  . Number of children: Not on file  . Years of education: Not on file  . Highest education level: Not on file  Occupational History  . Not on file  Tobacco Use  . Smoking status: Former Smoker    Years: 30.00    Quit date: 2013    Years since quitting: 8.5  . Smokeless tobacco: Never Used  Vaping Use  . Vaping Use: Never used  Substance and Sexual Activity  . Alcohol use: Yes    Alcohol/week: 7.0 standard drinks    Types: 7 Glasses of wine per week    Comment: per week  . Drug use: No  . Sexual activity: Not on file  Other Topics Concern  . Not on file  Social History Narrative   Associate degree    Wears seat belt, feels safe in marriage to Ron   1 daughter    Social Determinants of Health   Financial Resource Strain:   . Difficulty of Paying Living  Expenses:   Food Insecurity:   . Worried About Programme researcher, broadcasting/film/videounning Out of Food in the Last Year:   . Baristaan Out of Food in the Last Year:   Transportation Needs:   . Freight forwarderLack of Transportation (Medical):   Marland Kitchen. Lack of Transportation (Non-Medical):   Physical Activity:   . Days of Exercise per Week:   . Minutes of Exercise per Session:   Stress:   . Feeling of Stress :   Social Connections:   . Frequency of Communication with Friends and Family:   . Frequency of Social Gatherings with Friends and Family:   . Attends Religious Services:   . Active Member of Clubs or Organizations:   . Attends BankerClub or Organization Meetings:   Marland Kitchen. Marital Status:   Intimate Partner Violence:   . Fear of Current or Ex-Partner:   . Emotionally Abused:   Marland Kitchen. Physically Abused:   . Sexually Abused:    Current Meds  Medication Sig  . acetaminophen (TYLENOL) 500 MG tablet Take 500 mg by mouth at bedtime.  Marland Kitchen. albuterol (PROAIR HFA) 108 (90 Base) MCG/ACT inhaler Inhale 2 puffs into the lungs every 6 (six) hours as needed for wheezing or shortness of breath.  . Calcium Carbonate-Vitamin D (CALTRATE 600+D PO) Take 1 tablet by mouth 2 (two) times daily.   . cyclobenzaprine (FLEXERIL) 10 MG tablet Take 1 tablet (10 mg total) by mouth 2 (two) times daily as needed for muscle spasms.  . fluticasone (FLONASE) 50 MCG/ACT nasal spray Place 2 sprays into both nostrils daily as needed for allergies or rhinitis.  Marland Kitchen. gabapentin (NEURONTIN) 300 MG capsule Take 1 capsule (300 mg total) by mouth 4 (four) times daily.  . meloxicam (MOBIC) 7.5 MG tablet Take 1 tablet (7.5 mg total) by mouth 2 (two) times daily as needed for pain.  . Multiple Vitamin (MULTIVITAMIN WITH MINERALS) TABS tablet Take 1 tablet by mouth 2 (two) times daily.   Marland Kitchen. neomycin-polymyxin-hydrocortisone (CORTISPORIN) OTIC solution Place 4 drops into the right ear 4 (four) times daily. X right ear  . OVER THE COUNTER MEDICATION Take 1 tablet by mouth 2 (two) times daily. Estraval Hormone  Supplement  . OVER THE COUNTER MEDICATION Take 2 tablets by mouth 2 (two) times daily. Replenex Dietary Supplement  . OVER THE COUNTER MEDICATION Take 1  tablet by mouth daily. Nutradew Vision Supplement  . OVER THE COUNTER MEDICATION Take 1 tablet by mouth daily. Unforgettable Cognitive Health Supplement  . sodium chloride (OCEAN) 0.65 % SOLN nasal spray Place 1 spray into both nostrils daily as needed for congestion. Then flonase  . verapamil (CALAN-SR) 240 MG CR tablet Take 1 tablet (240 mg total) by mouth at bedtime.   Allergies  Allergen Reactions  . Latex Hives  . Sulfa Antibiotics Hives  . Linaclotide Itching   Recent Results (from the past 2160 hour(s))  Iron, TIBC and Ferritin Panel     Status: None   Collection Time: 09/16/19  9:04 AM  Result Value Ref Range   Iron 91 45 - 160 mcg/dL   TIBC 102 585 - 277 mcg/dL (calc)   %SAT 32 16 - 45 % (calc)   Ferritin 74 16 - 288 ng/mL  TSH     Status: None   Collection Time: 09/16/19  9:04 AM  Result Value Ref Range   TSH 1.79 0.35 - 4.50 uIU/mL  CBC with Differential/Platelet     Status: Abnormal   Collection Time: 09/16/19  9:04 AM  Result Value Ref Range   WBC 5.7 4.0 - 10.5 K/uL   RBC 3.96 3.87 - 5.11 Mil/uL   Hemoglobin 12.2 12.0 - 15.0 g/dL   HCT 82.4 36 - 46 %   MCV 91.0 78.0 - 100.0 fl   MCHC 34.0 30.0 - 36.0 g/dL   RDW 23.5 36.1 - 44.3 %   Platelets 188.0 150 - 400 K/uL   Neutrophils Relative % 44.2 43 - 77 %   Lymphocytes Relative 37.8 12 - 46 %   Monocytes Relative 10.3 3 - 12 %   Eosinophils Relative 6.8 (H) 0 - 5 %   Basophils Relative 0.9 0 - 3 %   Neutro Abs 2.5 1.4 - 7.7 K/uL   Lymphs Abs 2.2 0.7 - 4.0 K/uL   Monocytes Absolute 0.6 0 - 1 K/uL   Eosinophils Absolute 0.4 0 - 0 K/uL   Basophils Absolute 0.0 0 - 0 K/uL  Lipid panel     Status: None   Collection Time: 09/16/19  9:04 AM  Result Value Ref Range   Cholesterol 165 0 - 200 mg/dL    Comment: ATP III Classification       Desirable:  < 200 mg/dL                Borderline High:  200 - 239 mg/dL          High:  > = 154 mg/dL   Triglycerides 00.8 0 - 149 mg/dL    Comment: Normal:  <676 mg/dLBorderline High:  150 - 199 mg/dL   HDL 19.50 >93.26 mg/dL   VLDL 71.2 0.0 - 45.8 mg/dL   LDL Cholesterol 81 0 - 99 mg/dL   Total CHOL/HDL Ratio 2     Comment:                Men          Women1/2 Average Risk     3.4          3.3Average Risk          5.0          4.42X Average Risk          9.6          7.13X Average Risk          15.0  11.0                       NonHDL 96.42     Comment: NOTE:  Non-HDL goal should be 30 mg/dL higher than patient's LDL goal (i.e. LDL goal of < 70 mg/dL, would have non-HDL goal of < 100 mg/dL)  Comprehensive metabolic panel     Status: Abnormal   Collection Time: 09/16/19  9:04 AM  Result Value Ref Range   Sodium 140 135 - 145 mEq/L   Potassium 3.9 3.5 - 5.1 mEq/L   Chloride 104 96 - 112 mEq/L   CO2 28 19 - 32 mEq/L   Glucose, Bld 94 70 - 99 mg/dL   BUN 17 6 - 23 mg/dL   Creatinine, Ser 8.41 0.40 - 1.20 mg/dL   Total Bilirubin 0.9 0.2 - 1.2 mg/dL   Alkaline Phosphatase 73 39 - 117 U/L   AST 22 0 - 37 U/L   ALT 17 0 - 35 U/L   Total Protein 6.5 6.0 - 8.3 g/dL   Albumin 4.4 3.5 - 5.2 g/dL   GFR 32.44 (L) >01.02 mL/min   Calcium 9.2 8.4 - 10.5 mg/dL  Urinalysis, Routine w reflex microscopic     Status: None   Collection Time: 09/16/19 12:37 PM  Result Value Ref Range   Color, Urine YELLOW YELLOW   APPearance CLEAR CLEAR   Specific Gravity, Urine 1.014 1.001 - 1.03   pH 6.5 5.0 - 8.0   Glucose, UA NEGATIVE NEGATIVE   Bilirubin Urine NEGATIVE NEGATIVE   Ketones, ur NEGATIVE NEGATIVE   Hgb urine dipstick NEGATIVE NEGATIVE   Protein, ur NEGATIVE NEGATIVE   Nitrite NEGATIVE NEGATIVE   Leukocytes,Ua NEGATIVE NEGATIVE  HM MAMMOGRAPHY     Status: None   Collection Time: 09/22/19 12:00 AM  Result Value Ref Range   HM Mammogram 0-4 Bi-Rad 0-4 Bi-Rad, Self Reported Normal    Comment: [facility] neg 09/22/19    Objective  Body mass index is 23.4 kg/m. Wt Readings from Last 3 Encounters:  09/29/19 140 lb 9.6 oz (63.8 kg)  08/09/19 140 lb 9.6 oz (63.8 kg)  07/27/19 141 lb (64 kg)   Temp Readings from Last 3 Encounters:  09/29/19 98.3 F (36.8 C) (Oral)  08/09/19 (!) 96.9 F (36.1 C)  06/30/19 (!) 97.3 F (36.3 C) (Temporal)   BP Readings from Last 3 Encounters:  09/29/19 122/80  08/09/19 110/88  06/30/19 118/80   Pulse Readings from Last 3 Encounters:  09/29/19 84  08/09/19 87  06/30/19 94    Physical Exam Vitals and nursing note reviewed.  Constitutional:      Appearance: Normal appearance. She is well-developed and well-groomed.  HENT:     Head: Normocephalic and atraumatic.     Nose:     Right Nostril: Epistaxis present.     Comments: ?nasal sore Inflamed nares vs polyp Eyes:     Conjunctiva/sclera: Conjunctivae normal.     Pupils: Pupils are equal, round, and reactive to light.  Cardiovascular:     Rate and Rhythm: Normal rate and regular rhythm.     Heart sounds: Normal heart sounds. No murmur heard.   Pulmonary:     Effort: Pulmonary effort is normal.     Breath sounds: Normal breath sounds.  Skin:    General: Skin is warm and dry.  Neurological:     General: No focal deficit present.     Mental Status: She is alert and oriented to  person, place, and time. Mental status is at baseline.     Gait: Gait normal.  Psychiatric:        Attention and Perception: Attention and perception normal.        Mood and Affect: Mood and affect normal.        Speech: Speech normal.        Behavior: Behavior normal. Behavior is cooperative.        Thought Content: Thought content normal.        Cognition and Memory: Cognition and memory normal.        Judgment: Judgment normal.     Assessment  Plan  Nasal sore - Plan: mupirocin ointment (BACTROBAN) 2 % Nosebleed - Plan: mupirocin ointment (BACTROBAN) 2 % Nasal polyp ent appt [facility] Jennifer brooks 10/07/19    Dislocation of temporomandibular joint, subsequent encounter  Given exercises today   HM Declines flu vx  Disc Tdap vaccine and recgiven Tdap Rx today to get at pharmacy Consider shingrix in future will need to disc Consider prevnar and pna 23 vaccines in future Consider hep B vx in future covid vaccine not going to get she is unsure h/o hep C tx'ed f/u Dr. Servando Snare 11/29/19  Colonoscopy 2015 get records from [facility]  Last pap2/9/17 negative likely insurance will not cover another has medicare  -s/p hysterectomy with h/o cervical changes age 12 1 ovary intact DEXA7/11/2018 -1.8 to -1.9osteopenia [facility]  mammo7/11/2018 normal [facility] negative ordered repeat -h/o breast cyst per pt and asymmetries per last mammogram + FH breast cancer mother and sister pt [facility]  -[facility] and DEXA in future after covid 19 order in  Former smoker 30 years quit in 2010 1-1.5 pk would last 1 week no FH lung cancer. -calc risk score 2.4% consider CT chest lung cancer screening in future disc at f/u Provider: Dr. French Ana McLean-Scocuzza-Internal Medicine

## 2019-10-07 DIAGNOSIS — J310 Chronic rhinitis: Secondary | ICD-10-CM | POA: Diagnosis not present

## 2019-10-21 DIAGNOSIS — J301 Allergic rhinitis due to pollen: Secondary | ICD-10-CM | POA: Diagnosis not present

## 2019-11-16 ENCOUNTER — Encounter: Payer: Self-pay | Admitting: Internal Medicine

## 2019-11-16 ENCOUNTER — Other Ambulatory Visit: Payer: Self-pay

## 2019-11-16 ENCOUNTER — Telehealth (INDEPENDENT_AMBULATORY_CARE_PROVIDER_SITE_OTHER): Payer: Medicare Other | Admitting: Internal Medicine

## 2019-11-16 DIAGNOSIS — M542 Cervicalgia: Secondary | ICD-10-CM

## 2019-11-16 DIAGNOSIS — M5416 Radiculopathy, lumbar region: Secondary | ICD-10-CM

## 2019-11-16 DIAGNOSIS — Z9889 Other specified postprocedural states: Secondary | ICD-10-CM | POA: Diagnosis not present

## 2019-11-16 DIAGNOSIS — G8929 Other chronic pain: Secondary | ICD-10-CM

## 2019-11-16 DIAGNOSIS — M5412 Radiculopathy, cervical region: Secondary | ICD-10-CM

## 2019-11-16 DIAGNOSIS — G8928 Other chronic postprocedural pain: Secondary | ICD-10-CM | POA: Diagnosis not present

## 2019-11-16 DIAGNOSIS — M545 Low back pain, unspecified: Secondary | ICD-10-CM

## 2019-11-16 HISTORY — DX: Radiculopathy, cervical region: M54.12

## 2019-11-16 NOTE — Progress Notes (Signed)
Telephone Note  I connected with Amy Grant  on 11/16/19 at 11:50 AM EDT by telephone and verified that I am speaking with the correct person using two identifiers.  Location patient: home Location provider:work or home office Persons participating in the virtual visit: patient, provider  I discussed the limitations of evaluation and management by telemedicine and the availability of in person appointments. The patient expressed understanding and agreed to proceed.   HPI: 1. Chronic pain s/p surgery neck and lower back with Dr. Yevette Edwards ortho spine in [place] remotely she is on gabapentin 300 qid and been reading about this medication and c/w addiction potential and wants to taper down on this. She tries bengay alt with heat w/o relief and, flexeril 10 mg prn, and mobic 7.5 mg bid w/o relief pain is still there moderate to severe at times.  She c/o at times her arms are tingling b/l   Declines MRI C spine, L spine for now and f/u Dr. Yevette Edwards but does want to taper down on gabapentin 300 mg advised 300 tid x 1 week then bid x 1 week and call back and rec she consider PT and consider pain clinic as she does not want further surgeries for now     ROS: See pertinent positives and negatives per HPI.  Past Medical History:  Diagnosis Date   Anxiety    Arthritis    right hand, DDD L 4 , L 5   Asthma    Blood in stool    Chronic intermittent post-traumatic headache    Colon polyps    Complication of anesthesia 2018   difficulty waking up    Depression    Family history of adverse reaction to anesthesia    Sister woke up during surgery   GERD (gastroesophageal reflux disease)    Head injury    Headache    h/o migraines    Hepatitis    Hepatitis C    treated with mavyret per GI   History of blood transfusion    Hypertension    Laryngopharyngeal reflux (LPR)    Multiple falls    Pancreatitis    remote hx w/o h/o drinking at that time   Seizures (HCC)     after  head injury   Vitamin D deficiency     Past Surgical History:  Procedure Laterality Date   ABDOMINAL HYSTERECTOMY  1979   has an ovary left s/p hysterectomy 1979; h/o abnormal pap    acdf     08/07/16 Dr. Yevette Edwards    ANTERIOR CERVICAL DECOMP/DISCECTOMY FUSION N/A 08/07/2016   Procedure: ANTERIOR CERVICAL DECOMPRESSION FUSION, CERVICAL FOUR-FIVE  WITH INSTRUMENTATION AND ALLOGRAFT;  Surgeon: Estill Bamberg, MD;  Location: [facility] OR;  Service: Orthopedics;  Laterality: N/A;   APPENDECTOMY     BREAST BIOPSY Left    over 20 years ago negative left breast   CESAREAN SECTION     COLON RESECTION     paritial ? reason done in [place]    COLONOSCOPY     EXPLORATORY LAPAROTOMY     after C Section  x 2 - 1 for bleeding and 1 for infection   HERNIA REPAIR     Inguinal - as a child   low back surgery     Dr. Yevette Edwards    TONSILLECTOMY     TRANSFORAMINAL LUMBAR INTERBODY FUSION (TLIF) WITH PEDICLE SCREW FIXATION 1 LEVEL Right 03/24/2018   Procedure: RIGHT LUMBAR 4 - LUMBAR 5 TRANSFORAMINAL LUMBAR INTERBODY FUSION WITH INSTRUMENTATION AND  ALLOGRAFT;  Surgeon: Estill Bamberg, MD;  Location: [facility] OR;  Service: Orthopedics;  Laterality: Right;    Family History  Problem Relation Age of Onset   Breast cancer Sister 55   Cancer Sister        breast    Diabetes Sister    Miscarriages / [place] Sister    Cancer Mother        breast    Heart disease Mother    Hypertension Mother    Miscarriages / Stillbirths Mother    Heart failure Mother    Multiple sclerosis Father    Miscarriages / [place] Daughter    Arthritis Maternal Grandmother    Hyperlipidemia Maternal Grandmother    Learning disabilities Maternal Grandmother    Cancer Maternal Grandfather    Diabetes Paternal Grandmother    Arthritis Sister    Miscarriages / [place] Sister     SOCIAL HX: married to Ron   Current Outpatient Medications:    albuterol (PROAIR HFA) 108 (90 Base) MCG/ACT  inhaler, Inhale 2 puffs into the lungs every 6 (six) hours as needed for wheezing or shortness of breath., Disp: 18 g, Rfl: 11   Calcium Carbonate-Vitamin D (CALTRATE 600+D PO), Take 1 tablet by mouth 2 (two) times daily. , Disp: , Rfl:    cyclobenzaprine (FLEXERIL) 10 MG tablet, Take 1 tablet (10 mg total) by mouth 2 (two) times daily as needed for muscle spasms., Disp: 60 tablet, Rfl: 5   gabapentin (NEURONTIN) 300 MG capsule, Take 1 capsule (300 mg total) by mouth 4 (four) times daily., Disp: 120 capsule, Rfl: 5   meloxicam (MOBIC) 7.5 MG tablet, Take 1 tablet (7.5 mg total) by mouth 2 (two) times daily as needed for pain., Disp: 60 tablet, Rfl: 5   Multiple Vitamin (MULTIVITAMIN WITH MINERALS) TABS tablet, Take 1 tablet by mouth 2 (two) times daily. , Disp: , Rfl:    OVER THE COUNTER MEDICATION, Take 1 tablet by mouth 2 (two) times daily. Estraval Hormone Supplement, Disp: , Rfl:    OVER THE COUNTER MEDICATION, Take 2 tablets by mouth 2 (two) times daily. Replenex Dietary Supplement, Disp: , Rfl:    OVER THE COUNTER MEDICATION, Take 1 tablet by mouth daily. Nutradew Vision Supplement, Disp: , Rfl:    OVER THE COUNTER MEDICATION, Take 1 tablet by mouth daily. Unforgettable Cognitive Health Supplement, Disp: , Rfl:    verapamil (CALAN-SR) 240 MG CR tablet, Take 1 tablet (240 mg total) by mouth at bedtime., Disp: 90 tablet, Rfl: 3  EXAM:  VITALS per patient if applicable:  GENERAL: alert, oriented, appears well and in no acute distress  PSYCH/NEURO: pleasant and cooperative, no obvious depression or anxiety, speech and thought processing grossly intact  ASSESSMENT AND PLAN:  Discussed the following assessment and plan:  Chronic neck pain with history of cervical spinal surgery Dr. Yevette Edwards History of lumbar surgery Lumbar radiculopathy - Plan: MR Lumbar Spine Wo Contrast Cervicalgia - Plan: MR Cervical Spine Wo Contrast Cervical radiculopathy - Plan: MR Cervical Spine Wo  Contrast Chronic low back pain, unspecified back pain laterality, unspecified whether sciatica present -pt changed her mind agreeable imaging and will go from there ortho spine f/u, PT, pain clinic  Will try to taper off gabapentin 300 qid to tid x 1 week then 300 bid x 1 week and call back for pain assessment  Cont mobic and flexeril prn  May need upper arms NCS/EMG  HM Declines flu vx  covid shot 2/2 utd Disc Tdap vaccine  and recgiven Tdap Rx today to get at pharmacypreviously Consider shingrix in future will need to disc Consider prevnar and pna 23 vaccines in future Consider hep B vx in future h/o hep C tx'ed f/u Dr. Servando Snare 11/29/19  Colonoscopy 2015 get records from [facility] in the future   Last pap2/9/17 negative likely insurance will not cover another has medicare  -s/p hysterectomy with h/o cervical changes age 69 1 ovary intact  DEXA7/11/2018 -1.8 to -1.9osteopenia [facility]  mammo7/22/21 normal [facility] negative  -h/o breast cyst per pt and asymmetries per last mammogram + FH breast cancer mother and sister pt [facility]  -[facility] and DEXA in future after covid 19 order in  Former smoker 30 years quit in 2010 1-1.5 pk would last 1 week no FH lung cancer. -calc risk score 2.4% consider CT chest lung cancer screening in future disc at f/u [facility]A chest 11/23/15 negative   -we discussed possible serious and likely etiologies, options for evaluation and workup, limitations of telemedicine visit vs in person visit, treatment, treatment risks and precautions. Pt prefers to treat via telemedicine empirically rather then risking or undertaking an in person visit at this moment.  Work/School slipped offered: Advised to seek prompt follow up telemedicine visit or in person care if worsening, new symptoms arise, or if is not improving with treatment. Did let her know that I only do telemedicine on Tuesdays and Thursdays for  [facility] and advised follow up visit with PCP or [facility] if needs follow up or if any further questions arise to avoid any delays.   I discussed the assessment and treatment plan with the patient. The patient was provided an opportunity to ask questions and all were answered. The patient agreed with the plan and demonstrated an understanding of the instructions.   The patient was advised to call back or seek an in-person evaluation if the symptoms worsen or if the condition fails to improve as anticipated.  Time spent 15 minutes  Bevelyn Buckles, MD

## 2019-11-16 NOTE — Progress Notes (Signed)
Pre visit review using our clinic review tool, if applicable. No additional management support is needed unless otherwise documented below in the visit note. 

## 2019-11-21 DIAGNOSIS — Z9889 Other specified postprocedural states: Secondary | ICD-10-CM | POA: Insufficient documentation

## 2019-11-21 HISTORY — DX: Other specified postprocedural states: Z98.890

## 2019-11-28 ENCOUNTER — Encounter: Payer: Self-pay | Admitting: Internal Medicine

## 2019-11-28 ENCOUNTER — Telehealth: Payer: Self-pay | Admitting: Internal Medicine

## 2019-11-28 NOTE — Telephone Encounter (Signed)
Please advise 

## 2019-11-28 NOTE — Telephone Encounter (Signed)
Printed and placed on Dr French Ana McLean-Scocuzza desk to be signed tomorrow

## 2019-11-28 NOTE — Telephone Encounter (Signed)
Screening for blood or protein in urine [Z13.89]  Please advise

## 2019-11-28 NOTE — Telephone Encounter (Signed)
Returned call and new code given. The new code was approved

## 2019-11-28 NOTE — Telephone Encounter (Signed)
[facility] called Medicare denied Z13.89 for UA on 7/16 and wanted to know if there was another code   Please call Wynona Canes at [phone]

## 2019-11-28 NOTE — Telephone Encounter (Signed)
Printer letter for sign 11/29/19 and call pt in the am when ready thanks TMS

## 2019-11-28 NOTE — Telephone Encounter (Signed)
Patient called in stated that she has jury duty and the medications that she is on she is not able to drive that far she needs a paper stating that she is not able to do jury.

## 2019-11-28 NOTE — Telephone Encounter (Signed)
You can try R79.89

## 2019-11-29 ENCOUNTER — Other Ambulatory Visit: Payer: Self-pay

## 2019-11-29 ENCOUNTER — Encounter: Payer: Self-pay | Admitting: Gastroenterology

## 2019-11-29 ENCOUNTER — Ambulatory Visit (INDEPENDENT_AMBULATORY_CARE_PROVIDER_SITE_OTHER): Payer: Medicare Other | Admitting: Gastroenterology

## 2019-11-29 VITALS — BP 118/77 | HR 88 | Ht 65.0 in | Wt 143.4 lb

## 2019-11-29 DIAGNOSIS — B182 Chronic viral hepatitis C: Secondary | ICD-10-CM

## 2019-11-29 NOTE — Progress Notes (Signed)
Primary Care Physician: McLean-Scocuzza, Pasty Spillers, MD  Primary Gastroenterologist:  Dr. Midge Minium  Chief Complaint  Patient presents with  . Follow up Hepatitis C    HPI: Amy Grant is a 69 y.o. female here for follow-up after completing her treatment for hepatitis C.  The patient states that she feels fine without any side effects while she was taking a medication.  The patient then she has been off of the medication for the last 6 months and has noticed no difference.    Past Medical History:  Diagnosis Date  . Anxiety   . Arthritis    right hand, DDD L 4 , L 5  . Asthma   . Blood in stool   . Chronic intermittent post-traumatic headache   . Colon polyps   . Complication of anesthesia 2018   difficulty waking up   . Depression   . Family history of adverse reaction to anesthesia    Sister woke up during surgery  . GERD (gastroesophageal reflux disease)   . Head injury   . Headache    h/o migraines   . Hepatitis   . Hepatitis C    treated with mavyret per GI  . History of blood transfusion   . Hypertension   . Laryngopharyngeal reflux (LPR)   . Multiple falls   . Pancreatitis    remote hx w/o h/o drinking at that time  . Seizures (HCC)     after head injury  . Vitamin D deficiency     Current Outpatient Medications  Medication Sig Dispense Refill  . albuterol (PROAIR HFA) 108 (90 Base) MCG/ACT inhaler Inhale 2 puffs into the lungs every 6 (six) hours as needed for wheezing or shortness of breath. 18 g 11  . Calcium Carbonate-Vitamin D (CALTRATE 600+D PO) Take 1 tablet by mouth 2 (two) times daily.     . cyclobenzaprine (FLEXERIL) 10 MG tablet Take 1 tablet (10 mg total) by mouth 2 (two) times daily as needed for muscle spasms. 60 tablet 5  . gabapentin (NEURONTIN) 300 MG capsule Take 1 capsule (300 mg total) by mouth 4 (four) times daily. 120 capsule 5  . gentamicin ointment (GARAMYCIN) 0.1 % SMARTSIG:Sparingly Both Nares Twice Daily    . meloxicam  (MOBIC) 7.5 MG tablet Take 1 tablet (7.5 mg total) by mouth 2 (two) times daily as needed for pain. 60 tablet 5  . Multiple Vitamin (MULTIVITAMIN WITH MINERALS) TABS tablet Take 1 tablet by mouth 2 (two) times daily.     Marland Kitchen OVER THE COUNTER MEDICATION Take 1 tablet by mouth 2 (two) times daily. Estraval Hormone Supplement    . OVER THE COUNTER MEDICATION Take 2 tablets by mouth 2 (two) times daily. Replenex Dietary Supplement    . OVER THE COUNTER MEDICATION Take 1 tablet by mouth daily. Nutradew Vision Supplement    . OVER THE COUNTER MEDICATION Take 1 tablet by mouth daily. Unforgettable Cognitive Health Supplement    . PREVIDENT 5000 SENSITIVE 1.1-5 % PSTE Place onto teeth as directed.    . verapamil (CALAN-SR) 240 MG CR tablet Take 1 tablet (240 mg total) by mouth at bedtime. 90 tablet 3   No current facility-administered medications for this visit.    Allergies as of 11/29/2019 - Review Complete 11/29/2019  Allergen Reaction Noted  . Latex Hives 07/29/2016  . Sulfa antibiotics Hives 08/23/2013  . Linaclotide Itching 11/21/2013    ROS:  General: Negative for anorexia, weight loss, fever, chills, fatigue,  weakness. ENT: Negative for hoarseness, difficulty swallowing , nasal congestion. CV: Negative for chest pain, angina, palpitations, dyspnea on exertion, peripheral edema.  Respiratory: Negative for dyspnea at rest, dyspnea on exertion, cough, sputum, wheezing.  GI: See history of present illness. GU:  Negative for dysuria, hematuria, urinary incontinence, urinary frequency, nocturnal urination.  Endo: Negative for unusual weight change.    Physical Examination:   BP 118/77   Pulse 88   Ht 5\' 5"  (1.651 m)   Wt 143 lb 6.4 oz (65 kg)   BMI 23.86 kg/m   General: Well-nourished, well-developed in no acute distress.  Eyes: No icterus. Conjunctivae pink. Lungs: Clear to auscultation bilaterally. Non-labored. Heart: Regular rate and rhythm, no murmurs rubs or gallops.  Abdomen:  Bowel sounds are normal, nontender, nondistended, no hepatosplenomegaly or masses, no abdominal bruits or hernia , no rebound or guarding.   Extremities: No lower extremity edema. No clubbing or deformities. Neuro: Alert and oriented x 3.  Grossly intact. Skin: Warm and dry, no jaundice.   Psych: Alert and cooperative, normal mood and affect.  Labs:    Imaging Studies: No results found.  Assessment and Plan:   Amy Grant is a 69 y.o. y/o female Who comes in today after completing treatment for hepatitis C.  The patient will have her labs sent off for a viral load and LFTs.  The patient has been told that she will be contacted with the results of the labs.  The patient has been The plan and agrees with it.     79, MD. Midge Minium    Note: This dictation was prepared with Dragon dictation along with smaller phrase technology. Any transcriptional errors that result from this process are unintentional.

## 2019-11-29 NOTE — Telephone Encounter (Signed)
Patient informed and verbalized understanding.  Letter placed upfront

## 2019-11-30 LAB — HEPATIC FUNCTION PANEL
ALT: 19 IU/L (ref 0–32)
AST: 22 IU/L (ref 0–40)
Albumin: 4.5 g/dL (ref 3.8–4.8)
Alkaline Phosphatase: 93 IU/L (ref 44–121)
Bilirubin Total: 0.6 mg/dL (ref 0.0–1.2)
Bilirubin, Direct: 0.22 mg/dL (ref 0.00–0.40)
Total Protein: 6.6 g/dL (ref 6.0–8.5)

## 2019-11-30 LAB — HCV RNA QUANT: Hepatitis C Quantitation: NOT DETECTED IU/mL

## 2019-12-02 ENCOUNTER — Ambulatory Visit
Admission: RE | Admit: 2019-12-02 | Discharge: 2019-12-02 | Disposition: A | Discharge status: Home / Self Care | Payer: Medicare Other | Source: Ambulatory Visit | Attending: Internal Medicine | Admitting: Internal Medicine

## 2019-12-02 ENCOUNTER — Telehealth: Payer: Self-pay

## 2019-12-02 ENCOUNTER — Other Ambulatory Visit: Payer: Self-pay

## 2019-12-02 DIAGNOSIS — M5416 Radiculopathy, lumbar region: Secondary | ICD-10-CM

## 2019-12-02 DIAGNOSIS — M5412 Radiculopathy, cervical region: Secondary | ICD-10-CM | POA: Diagnosis not present

## 2019-12-02 DIAGNOSIS — M545 Low back pain, unspecified: Secondary | ICD-10-CM | POA: Diagnosis not present

## 2019-12-02 DIAGNOSIS — M542 Cervicalgia: Secondary | ICD-10-CM | POA: Diagnosis not present

## 2019-12-02 NOTE — Telephone Encounter (Signed)
-----   Message from Midge Minium, MD sent at 12/01/2019  7:46 AM EDT ----- Let the patient know that her hepatitis C is undetectable and her liver enzymes are normal.

## 2019-12-02 NOTE — Telephone Encounter (Signed)
Pt notified of lab results

## 2019-12-06 ENCOUNTER — Encounter: Payer: Self-pay | Admitting: *Deleted

## 2019-12-09 ENCOUNTER — Telehealth (INDEPENDENT_AMBULATORY_CARE_PROVIDER_SITE_OTHER): Payer: Medicare Other | Admitting: Internal Medicine

## 2019-12-09 ENCOUNTER — Encounter: Payer: Self-pay | Admitting: Internal Medicine

## 2019-12-09 ENCOUNTER — Telehealth: Payer: Self-pay | Admitting: *Deleted

## 2019-12-09 ENCOUNTER — Other Ambulatory Visit: Payer: Self-pay

## 2019-12-09 VITALS — BP 125/83 | HR 88 | Ht 65.0 in | Wt 138.0 lb

## 2019-12-09 DIAGNOSIS — R200 Anesthesia of skin: Secondary | ICD-10-CM | POA: Diagnosis not present

## 2019-12-09 DIAGNOSIS — R202 Paresthesia of skin: Secondary | ICD-10-CM

## 2019-12-09 DIAGNOSIS — M48061 Spinal stenosis, lumbar region without neurogenic claudication: Secondary | ICD-10-CM | POA: Diagnosis not present

## 2019-12-09 DIAGNOSIS — M5412 Radiculopathy, cervical region: Secondary | ICD-10-CM

## 2019-12-09 DIAGNOSIS — M5416 Radiculopathy, lumbar region: Secondary | ICD-10-CM | POA: Diagnosis not present

## 2019-12-09 NOTE — Telephone Encounter (Signed)
Please place x-ray order

## 2019-12-09 NOTE — Progress Notes (Signed)
Virtual Visit via Video Note  I connected with Amy Grant  on 12/09/19 at  1:30 PM EDT by a video enabled telemedicine application and verified that I am speaking with the correct person using two identifiers.  Location patient: home Location provider:work or home office Persons participating in the virtual visit: patient, provider, pts husband Ron  I discussed the limitations of evaluation and management by telemedicine and the availability of in person appointments. The patient expressed understanding and agreed to proceed.   HPI: Abnormal MRI C and L spine 12/02/2019 with daily pain 5/10 +L5 radiculopathy, spinal stenosis L3/4 mild and cervical deg. Disc degeneration and moderate foraminal stenosis and moderate right neural foraminal stenosis. She is having pain worse lying down b/l hips and radiating down legs esp left s/p neck and low back surgery  She has flexeril 10 mg bid prn last visit she asked about tapering down gabapentin but she did not do this on 300 qid   ROS: See pertinent positives and negatives per HPI.  Past Medical History:  Diagnosis Date  . Anxiety   . Arthritis    right hand, DDD L 4 , L 5  . Asthma   . Blood in stool   . Chronic intermittent post-traumatic headache   . Colon polyps   . Complication of anesthesia 2018   difficulty waking up   . Depression   . Family history of adverse reaction to anesthesia    Sister woke up during surgery  . GERD (gastroesophageal reflux disease)   . Head injury   . Headache    h/o migraines   . Hepatitis   . Hepatitis C    treated with mavyret per GI  . History of blood transfusion   . Hypertension   . Laryngopharyngeal reflux (LPR)   . Multiple falls   . Pancreatitis    remote hx w/o h/o drinking at that time  . Seizures (HCC)     after head injury  . Vitamin D deficiency     Past Surgical History:  Procedure Laterality Date  . ABDOMINAL HYSTERECTOMY  1979   has an ovary left s/p hysterectomy 1979; h/o  abnormal pap   . acdf     08/07/16 Dr. Yevette Edwards   . ANTERIOR CERVICAL DECOMP/DISCECTOMY FUSION N/A 08/07/2016   Procedure: ANTERIOR CERVICAL DECOMPRESSION FUSION, CERVICAL FOUR-FIVE  WITH INSTRUMENTATION AND ALLOGRAFT;  Surgeon: Estill Bamberg, MD;  Location: [facility] OR;  Service: Orthopedics;  Laterality: N/A;  . APPENDECTOMY    . BREAST BIOPSY Left    over 20 years ago negative left breast  . CESAREAN SECTION    . COLON RESECTION     paritial ? reason done in [place]   . COLONOSCOPY    . EXPLORATORY LAPAROTOMY     after C Section  x 2 - 1 for bleeding and 1 for infection  . HERNIA REPAIR     Inguinal - as a child  . low back surgery     Dr. Yevette Edwards   . TONSILLECTOMY    . TRANSFORAMINAL LUMBAR INTERBODY FUSION (TLIF) WITH PEDICLE SCREW FIXATION 1 LEVEL Right 03/24/2018   Procedure: RIGHT LUMBAR 4 - LUMBAR 5 TRANSFORAMINAL LUMBAR INTERBODY FUSION WITH INSTRUMENTATION AND ALLOGRAFT;  Surgeon: Estill Bamberg, MD;  Location: [facility] OR;  Service: Orthopedics;  Laterality: Right;     Current Outpatient Medications:  .  albuterol (PROAIR HFA) 108 (90 Base) MCG/ACT inhaler, Inhale 2 puffs into the lungs every 6 (six) hours as needed for wheezing  or shortness of breath., Disp: 18 g, Rfl: 11 .  Calcium Carbonate-Vitamin D (CALTRATE 600+D PO), Take 1 tablet by mouth 2 (two) times daily. , Disp: , Rfl:  .  cyclobenzaprine (FLEXERIL) 10 MG tablet, Take 1 tablet (10 mg total) by mouth 2 (two) times daily as needed for muscle spasms., Disp: 60 tablet, Rfl: 5 .  gabapentin (NEURONTIN) 300 MG capsule, Take 1 capsule (300 mg total) by mouth 4 (four) times daily., Disp: 120 capsule, Rfl: 5 .  meloxicam (MOBIC) 7.5 MG tablet, Take 1 tablet (7.5 mg total) by mouth 2 (two) times daily as needed for pain., Disp: 60 tablet, Rfl: 5 .  Multiple Vitamin (MULTIVITAMIN WITH MINERALS) TABS tablet, Take 1 tablet by mouth 2 (two) times daily. , Disp: , Rfl:  .  OVER THE COUNTER MEDICATION, Take 1 tablet by mouth 2 (two) times  daily. Estraval Hormone Supplement, Disp: , Rfl:  .  OVER THE COUNTER MEDICATION, Take 2 tablets by mouth 2 (two) times daily. Replenex Dietary Supplement, Disp: , Rfl:  .  OVER THE COUNTER MEDICATION, Take 1 tablet by mouth daily. Nutradew Vision Supplement, Disp: , Rfl:  .  OVER THE COUNTER MEDICATION, Take 1 tablet by mouth daily. Unforgettable Cognitive Health Supplement, Disp: , Rfl:  .  PREVIDENT 5000 SENSITIVE 1.1-5 % PSTE, Place onto teeth as directed., Disp: , Rfl:  .  verapamil (CALAN-SR) 240 MG CR tablet, Take 1 tablet (240 mg total) by mouth at bedtime., Disp: 90 tablet, Rfl: 3  EXAM:  VITALS per patient if applicable:  GENERAL: alert, oriented, appears well and in no acute distress  HEENT: atraumatic, conjunttiva clear, no obvious abnormalities on inspection of external nose and ears  NECK: normal movements of the head and neck  LUNGS: on inspection no signs of respiratory distress, breathing rate appears normal, no obvious gross SOB, gasping or wheezing  CV: no obvious cyanosis  MS: moves all visible extremities without noticeable abnormality  PSYCH/NEURO: pleasant and cooperative, no obvious depression or anxiety, speech and thought processing grossly intact  ASSESSMENT AND PLAN:  Discussed the following assessment and plan:  Lumbar radiculopathy  Cervical radiculopathy  Numbness and tingling of both upper extremities  Try to get appt with Dr. Carlena Sax consider epidural injections Dr. Regino Schultze CC'ed and faxed note to them both  Flexeril 10 mg bid prn  Gabapentin 300 qid   12/02/19 IMPRESSION: 1. Progressive multilevel cervical disc and facet degeneration. 2. Moderate neural foraminal stenosis and mild spinal stenosis at C6-7. 3. Progressive severe right facet arthrosis at C5-6 with new facet edema and moderate right neural foraminal stenosis. 4. Unchanged moderate right neural foraminal stenosis at C7-T1. 5. C4-5 ACDF with myelomalacia.  12/02/19 L spine  MRI  IMPRESSION: 1. Interval L4-5 fusion with improved spinal canal and neural foraminal patency. 2. Suspected new small right foraminal disc extrusion at L5-S1 with moderate neural foraminal stenosis. Correlate for L5 radiculopathy. 3. Increased, mild spinal stenosis at L3-4.   -we discussed possible serious and likely etiologies, options for evaluation and workup, limitations of telemedicine visit vs in person visit, treatment, treatment risks and precautions.     I discussed the assessment and treatment plan with the patient. The patient was provided an opportunity to ask questions and all were answered. The patient agreed with the plan and demonstrated an understanding of the instructions.    Time spent 20 min Bevelyn Buckles, MD

## 2019-12-12 ENCOUNTER — Other Ambulatory Visit: Payer: Self-pay | Admitting: Internal Medicine

## 2019-12-12 DIAGNOSIS — M25552 Pain in left hip: Secondary | ICD-10-CM

## 2019-12-12 DIAGNOSIS — M25551 Pain in right hip: Secondary | ICD-10-CM

## 2019-12-12 DIAGNOSIS — R202 Paresthesia of skin: Secondary | ICD-10-CM | POA: Insufficient documentation

## 2019-12-12 NOTE — Telephone Encounter (Signed)
I ordered b/l hip Xrays

## 2019-12-13 ENCOUNTER — Ambulatory Visit (INDEPENDENT_AMBULATORY_CARE_PROVIDER_SITE_OTHER): Payer: Medicare Other

## 2019-12-13 ENCOUNTER — Other Ambulatory Visit: Payer: Medicare Other

## 2019-12-13 ENCOUNTER — Other Ambulatory Visit: Payer: Self-pay

## 2019-12-13 DIAGNOSIS — M25551 Pain in right hip: Secondary | ICD-10-CM

## 2019-12-13 DIAGNOSIS — M25552 Pain in left hip: Secondary | ICD-10-CM

## 2019-12-14 NOTE — Telephone Encounter (Signed)
Faxed order to Dr.Dummski Regino Schultze at [facility] faxed on 12-14-19

## 2020-02-20 ENCOUNTER — Other Ambulatory Visit: Payer: Self-pay | Admitting: Internal Medicine

## 2020-02-20 DIAGNOSIS — G8929 Other chronic pain: Secondary | ICD-10-CM

## 2020-02-20 MED ORDER — MELOXICAM 7.5 MG PO TABS: 7.5000 mg | ORAL_TABLET | Freq: Two times a day (BID) | ORAL | 5 refills | Status: DC | PRN

## 2020-03-14 ENCOUNTER — Ambulatory Visit: Payer: Medicare Other | Admitting: Internal Medicine

## 2020-03-27 ENCOUNTER — Other Ambulatory Visit: Payer: Self-pay

## 2020-03-27 ENCOUNTER — Encounter: Payer: Self-pay | Admitting: Internal Medicine

## 2020-03-27 ENCOUNTER — Ambulatory Visit (INDEPENDENT_AMBULATORY_CARE_PROVIDER_SITE_OTHER): Payer: Medicare Other | Admitting: Internal Medicine

## 2020-03-27 VITALS — BP 116/88 | HR 81 | Temp 97.9°F | Ht 65.0 in | Wt 143.8 lb

## 2020-03-27 DIAGNOSIS — N644 Mastodynia: Secondary | ICD-10-CM

## 2020-03-27 DIAGNOSIS — Z1389 Encounter for screening for other disorder: Secondary | ICD-10-CM

## 2020-03-27 DIAGNOSIS — Z1329 Encounter for screening for other suspected endocrine disorder: Secondary | ICD-10-CM

## 2020-03-27 DIAGNOSIS — Z0000 Encounter for general adult medical examination without abnormal findings: Secondary | ICD-10-CM

## 2020-03-27 DIAGNOSIS — Z8619 Personal history of other infectious and parasitic diseases: Secondary | ICD-10-CM

## 2020-03-27 DIAGNOSIS — Z13220 Encounter for screening for lipoid disorders: Secondary | ICD-10-CM

## 2020-03-27 DIAGNOSIS — Z1231 Encounter for screening mammogram for malignant neoplasm of breast: Secondary | ICD-10-CM

## 2020-03-27 HISTORY — DX: Personal history of other infectious and parasitic diseases: Z86.19

## 2020-03-27 NOTE — Progress Notes (Addendum)
Chief Complaint  Patient presents with  . Breast Pain    Left breast    F/u  1. Left breast pain tender x 1 month h/o breast bx 20 years ago and had bloody nipple discharge and breast aspiration  Movement makes worse pain 5/10 nothing tried Area is painful sensitive to touch had mammogram 09/2019 negative screening   Review of Systems  Constitutional: Negative for weight loss.  HENT: Negative for hearing loss.   Eyes: Negative for blurred vision.  Respiratory: Negative for shortness of breath.   Cardiovascular: Negative for chest pain.  Genitourinary:       +left breast pain  Skin: Negative for rash.   Past Medical History:  Diagnosis Date  . Anxiety   . Arthritis    right hand, DDD L 4 , L 5  . Asthma   . Blood in stool   . Chronic intermittent post-traumatic headache   . Colon polyps   . Complication of anesthesia 2018   difficulty waking up   . Depression   . Family history of adverse reaction to anesthesia    Sister woke up during surgery  . GERD (gastroesophageal reflux disease)   . Head injury   . Headache    h/o migraines   . Hepatitis   . Hepatitis C    treated with mavyret per GI  . History of blood transfusion   . Hypertension   . Laryngopharyngeal reflux (LPR)   . Multiple falls   . Pancreatitis    remote hx w/o h/o drinking at that time  . Seizures (HCC)     after head injury  . Vitamin D deficiency    Past Surgical History:  Procedure Laterality Date  . ABDOMINAL HYSTERECTOMY  1979   has an ovary left s/p hysterectomy 1979; h/o abnormal pap   . acdf     08/07/16 Dr. Yevette Edwards   . ANTERIOR CERVICAL DECOMP/DISCECTOMY FUSION N/A 08/07/2016   Procedure: ANTERIOR CERVICAL DECOMPRESSION FUSION, CERVICAL FOUR-FIVE  WITH INSTRUMENTATION AND ALLOGRAFT;  Surgeon: Estill Bamberg, MD;  Location: [facility] OR;  Service: Orthopedics;  Laterality: N/A;  . APPENDECTOMY    . BREAST BIOPSY Left    over 20 years ago negative left breast as of 03/2020   . CESAREAN SECTION     . COLON RESECTION     paritial ? reason done in [place]   . COLONOSCOPY    . EXPLORATORY LAPAROTOMY     after C Section  x 2 - 1 for bleeding and 1 for infection  . HERNIA REPAIR     Inguinal - as a child  . low back surgery     Dr. Yevette Edwards   . TONSILLECTOMY    . TRANSFORAMINAL LUMBAR INTERBODY FUSION (TLIF) WITH PEDICLE SCREW FIXATION 1 LEVEL Right 03/24/2018   Procedure: RIGHT LUMBAR 4 - LUMBAR 5 TRANSFORAMINAL LUMBAR INTERBODY FUSION WITH INSTRUMENTATION AND ALLOGRAFT;  Surgeon: Estill Bamberg, MD;  Location: [facility] OR;  Service: Orthopedics;  Laterality: Right;   Family History  Problem Relation Age of Onset  . Breast cancer Sister 104  . Cancer Sister        breast   . Diabetes Sister   . Miscarriages / Stillbirths Sister   . Cancer Mother        breast/precancer changes  . Heart disease Mother   . Hypertension Mother   . Miscarriages / [place] Mother   . Heart failure Mother   . Multiple sclerosis Father   . Miscarriages /  Stillbirths Daughter   . Arthritis Maternal Grandmother   . Hyperlipidemia Maternal Grandmother   . Learning disabilities Maternal Grandmother   . Cancer Maternal Grandfather   . Diabetes Paternal Grandmother   . Arthritis Sister   . Miscarriages / Stillbirths Sister   . Other Sister        precancerous changes breast   Social History   Socioeconomic History  . Marital status: Divorced    Spouse name: Not on file  . Number of children: Not on file  . Years of education: Not on file  . Highest education level: Not on file  Occupational History  . Not on file  Tobacco Use  . Smoking status: Former Smoker    Years: 30.00    Quit date: 2013    Years since quitting: 9.0  . Smokeless tobacco: Never Used  Vaping Use  . Vaping Use: Never used  Substance and Sexual Activity  . Alcohol use: Yes    Alcohol/week: 7.0 standard drinks    Types: 7 Glasses of wine per week    Comment: per week  . Drug use: No  . Sexual activity: Not on file   Other Topics Concern  . Not on file  Social History Narrative   Associate degree    Wears seat belt, feels safe in marriage to Ron   1 daughter    Social Determinants of Health   Financial Resource Strain: Not on file  Food Insecurity: Not on file  Transportation Needs: Not on file  Physical Activity: Not on file  Stress: Not on file  Social Connections: Not on file  Intimate Partner Violence: Not on file   Current Meds  Medication Sig  . albuterol (PROAIR HFA) 108 (90 Base) MCG/ACT inhaler Inhale 2 puffs into the lungs every 6 (six) hours as needed for wheezing or shortness of breath.  . Calcium Carbonate-Vitamin D (CALTRATE 600+D PO) Take 1 tablet by mouth 2 (two) times daily.   . cyclobenzaprine (FLEXERIL) 10 MG tablet Take 1 tablet (10 mg total) by mouth 2 (two) times daily as needed for muscle spasms.  Marland Kitchen gabapentin (NEURONTIN) 300 MG capsule Take 1 capsule (300 mg total) by mouth 4 (four) times daily.  . meloxicam (MOBIC) 7.5 MG tablet Take 1 tablet (7.5 mg total) by mouth 2 (two) times daily as needed for pain.  . Multiple Vitamin (MULTIVITAMIN WITH MINERALS) TABS tablet Take 1 tablet by mouth 2 (two) times daily.   Marland Kitchen PREVIDENT 5000 SENSITIVE 1.1-5 % PSTE Place onto teeth as directed.   Allergies  Allergen Reactions  . Latex Hives  . Sulfa Antibiotics Hives  . Linaclotide Itching   No results found for this or any previous visit (from the past 2160 hour(s)). Objective  Body mass index is 23.93 kg/m. Wt Readings from Last 3 Encounters:  03/27/20 143 lb 12.8 oz (65.2 kg)  12/09/19 138 lb (62.6 kg)  11/29/19 143 lb 6.4 oz (65 kg)   Temp Readings from Last 3 Encounters:  03/27/20 97.9 F (36.6 C) (Oral)  09/29/19 98.3 F (36.8 C) (Oral)  08/09/19 (!) 96.9 F (36.1 C)   BP Readings from Last 3 Encounters:  03/27/20 116/88  12/09/19 125/83  11/29/19 118/77   Pulse Readings from Last 3 Encounters:  03/27/20 81  12/09/19 88  11/29/19 88    Physical  Exam Vitals and nursing note reviewed.  Constitutional:      Appearance: Normal appearance. She is well-developed and well-groomed.  HENT:  Head: Normocephalic and atraumatic.  Eyes:     Conjunctiva/sclera: Conjunctivae normal.     Pupils: Pupils are equal, round, and reactive to light.  Cardiovascular:     Rate and Rhythm: Normal rate and regular rhythm.     Heart sounds: Normal heart sounds. No murmur heard.   Pulmonary:     Effort: Pulmonary effort is normal.     Breath sounds: Normal breath sounds.  Chest:  Breasts:     Left: Tenderness present. No nipple discharge.     Skin:    General: Skin is warm and dry.  Neurological:     General: No focal deficit present.     Mental Status: She is alert and oriented to person, place, and time. Mental status is at baseline.     Gait: Gait normal.  Psychiatric:        Attention and Perception: Attention and perception normal.        Mood and Affect: Mood and affect normal.        Speech: Speech normal.        Behavior: Behavior normal. Behavior is cooperative.        Thought Content: Thought content normal.        Cognition and Memory: Cognition and memory normal.        Judgment: Judgment normal.     Assessment  Plan  Breast pain, left - Plan: MM DIAG BREAST TOMO UNI LEFT, US BREAST LTD UNI LEFT INC AXILLA  [facility] FH breast cancer Left breast dx mammo and [place] negative 04/17/20 screening due 08/2020   HM Declines flu vx  Sch pfizer 04/06/20 had 2/2 itd Disc Tdap vaccine and recgiven Tdap Rx today to get at pharmacy Consider shingrix in future will need to disc Consider prevnar and pna 23 vaccines in future Consider hep B vx in future h/o hep C tx'ed f/u Dr. Servando Snare 11/29/19 negative not detected   Colonoscopy 2015 get records from [facility]   Last pap2/9/17 negative likely insurance will not cover another has medicare  -s/p hysterectomy with h/o cervical changes age 69 1 ovary intact  DEXA7/11/2018 -1.8 to  -1.9osteopenia [facility] f/u in 3-5 years   mammo7/11/2018 normal [facility] negative ordered repeat -h/o breast cyst per pt and asymmetries per last mammogram + FH breast cancer mother and sister pt agreeable -[facility]  -agreeable to [facility]o 09/22/19 negative  Former smoker 30 years quit in 2010 1-1.5 pk would last 1 week no FH lung cancer. -calc risk score 2.4% consider CT chest lung cancer screening in future disc at f/u   Provider: Dr. French Ana McLean-Scocuzza-Internal Medicine

## 2020-03-27 NOTE — Patient Instructions (Addendum)
biosil amazon or whole foods  Vital proteins collage strawberry lemonade target  [place] oil hair products hydrating shampoo and conditioner and deep conditioner  Hair skin nail vitamins Banyan botanicals ayuverdic oils organic healthy hair oil  giovannis tea tree  Rosemary for scalp   Babyliss temp controlled  Flat iron

## 2020-03-27 NOTE — Progress Notes (Signed)
Patient presenting with left breast pain along the bottom side. Ongoing for a month. No known injuries or visual changes.  Patient states that she had a lumpectomy on this same breast before that was complicated by infection. Patient having tenderness to the touch and with rasing arms above the head. Pain rated 5/10.

## 2020-03-28 ENCOUNTER — Telehealth: Payer: Self-pay

## 2020-03-28 NOTE — Telephone Encounter (Signed)
Faxed request for colonoscopy, pap, dexa to [facility] [phone] on 03/28/20

## 2020-04-06 ENCOUNTER — Ambulatory Visit: Payer: Self-pay

## 2020-04-13 ENCOUNTER — Ambulatory Visit: Payer: Medicare Other

## 2020-04-17 ENCOUNTER — Encounter: Payer: Self-pay | Admitting: Internal Medicine

## 2020-04-17 ENCOUNTER — Ambulatory Visit: Payer: Self-pay | Attending: Internal Medicine

## 2020-04-17 ENCOUNTER — Encounter: Payer: Self-pay | Admitting: *Deleted

## 2020-04-17 ENCOUNTER — Telehealth: Payer: Self-pay | Admitting: Internal Medicine

## 2020-04-17 DIAGNOSIS — Z23 Encounter for immunization: Secondary | ICD-10-CM

## 2020-04-17 DIAGNOSIS — N644 Mastodynia: Secondary | ICD-10-CM | POA: Diagnosis not present

## 2020-04-17 NOTE — Addendum Note (Signed)
Addended by: Quentin Ore on: 04/17/2020 05:08 PM   Modules accepted: Orders

## 2020-04-17 NOTE — Progress Notes (Signed)
   Covid-19 Vaccination Clinic  Name:  Amy Grant    MRN: [id number] DOB: [birth year]  04/17/2020  [name] was observed post Covid-19 immunization for 15 minutes without incident. She was provided with Vaccine Information Sheet and instruction to access the V-Safe system.   [name] was instructed to call 911 with any severe reactions post vaccine: Marland Kitchen Difficulty breathing  . Swelling of face and throat  . A fast heartbeat  . A bad rash all over body  . Dizziness and weakness   Immunizations Administered    Name Date Dose VIS Date Route   PFIZER Comrnaty(Gray TOP) Covid-19 Vaccine 04/17/2020  1:08 PM 0.3 mL 02/09/2020 Intramuscular   Manufacturer: [employer], [employer]   Lot: [id number]   NDC: [phone]

## 2020-04-17 NOTE — Telephone Encounter (Signed)
Left breast diagnostic mammo and [place] negative 04/17/20 screening due 08/2020

## 2020-04-19 NOTE — Telephone Encounter (Signed)
Patient was returning call about her mammogram

## 2020-04-19 NOTE — Telephone Encounter (Signed)
Patient informed and verbalized understanding

## 2020-04-19 NOTE — Telephone Encounter (Signed)
Patient has seen result message on mychart. Left message to return call with any questions.

## 2020-04-23 ENCOUNTER — Encounter: Payer: Self-pay | Admitting: Internal Medicine

## 2020-05-22 ENCOUNTER — Other Ambulatory Visit: Payer: Self-pay | Admitting: Internal Medicine

## 2020-05-22 DIAGNOSIS — M5416 Radiculopathy, lumbar region: Secondary | ICD-10-CM

## 2020-06-25 ENCOUNTER — Other Ambulatory Visit: Payer: Self-pay | Admitting: Internal Medicine

## 2020-06-25 DIAGNOSIS — M5416 Radiculopathy, lumbar region: Secondary | ICD-10-CM

## 2020-06-29 ENCOUNTER — Ambulatory Visit: Payer: Medicare Other | Admitting: Internal Medicine

## 2020-07-06 ENCOUNTER — Other Ambulatory Visit: Payer: Self-pay

## 2020-07-06 ENCOUNTER — Encounter: Payer: Self-pay | Admitting: Internal Medicine

## 2020-07-06 ENCOUNTER — Ambulatory Visit (INDEPENDENT_AMBULATORY_CARE_PROVIDER_SITE_OTHER): Payer: Medicare Other | Admitting: Internal Medicine

## 2020-07-06 VITALS — BP 126/84 | HR 82 | Temp 98.3°F | Ht 65.0 in | Wt 143.0 lb

## 2020-07-06 DIAGNOSIS — I10 Essential (primary) hypertension: Secondary | ICD-10-CM | POA: Diagnosis not present

## 2020-07-06 DIAGNOSIS — L438 Other lichen planus: Secondary | ICD-10-CM

## 2020-07-06 DIAGNOSIS — L659 Nonscarring hair loss, unspecified: Secondary | ICD-10-CM

## 2020-07-06 DIAGNOSIS — Z1389 Encounter for screening for other disorder: Secondary | ICD-10-CM

## 2020-07-06 DIAGNOSIS — M545 Low back pain, unspecified: Secondary | ICD-10-CM

## 2020-07-06 DIAGNOSIS — J4520 Mild intermittent asthma, uncomplicated: Secondary | ICD-10-CM

## 2020-07-06 DIAGNOSIS — G4700 Insomnia, unspecified: Secondary | ICD-10-CM

## 2020-07-06 DIAGNOSIS — G8929 Other chronic pain: Secondary | ICD-10-CM | POA: Diagnosis not present

## 2020-07-06 DIAGNOSIS — Z1329 Encounter for screening for other suspected endocrine disorder: Secondary | ICD-10-CM

## 2020-07-06 DIAGNOSIS — M5416 Radiculopathy, lumbar region: Secondary | ICD-10-CM

## 2020-07-06 DIAGNOSIS — Z1231 Encounter for screening mammogram for malignant neoplasm of breast: Secondary | ICD-10-CM | POA: Diagnosis not present

## 2020-07-06 MED ORDER — ESZOPICLONE 2 MG PO TABS: 1.0000 mg | ORAL_TABLET | Freq: Every evening | ORAL | 0 refills | Status: DC | PRN

## 2020-07-06 MED ORDER — MELOXICAM 7.5 MG PO TABS: 7.5000 mg | ORAL_TABLET | Freq: Two times a day (BID) | ORAL | 5 refills | Status: DC | PRN

## 2020-07-06 MED ORDER — ALBUTEROL SULFATE HFA 108 (90 BASE) MCG/ACT IN AERS: 2.0000 | INHALATION_SPRAY | Freq: Four times a day (QID) | RESPIRATORY_TRACT | 11 refills | Status: DC | PRN

## 2020-07-06 MED ORDER — GABAPENTIN 300 MG PO CAPS: 600.0000 mg | ORAL_CAPSULE | Freq: Two times a day (BID) | ORAL | 5 refills | Status: DC

## 2020-07-06 MED ORDER — CYCLOBENZAPRINE HCL 10 MG PO TABS: ORAL_TABLET | ORAL | 5 refills | Status: DC

## 2020-07-06 NOTE — Patient Instructions (Addendum)
Consider breast MRI and with and w/o dye Breast surgery GYN   For breast pain   Consider 4th dose covid 19 vaccine in 09/14/20 or 10/15/20   Breast Tenderness Breast tenderness is a common problem for women of all ages, but may also occur in men. Breast tenderness may range from mild discomfort to severe pain. In women, the pain usually comes and goes with the menstrual cycle, but it can also be constant. Breast tenderness has many possible causes, including hormone changes, infections, and taking certain medicines. You may have tests, such as a mammogram or an ultrasound, to check for any unusual findings. Having breast tenderness usually does not mean that you have breast cancer. Follow these instructions at home: Managing pain and discomfort  If directed, put ice to the painful area. To do this: ? Put ice in a plastic bag. ? Place a towel between your skin and the bag. ? Leave the ice on for 20 minutes, 2-3 times a day.  Wear a supportive bra, especially during exercise. You may also want to wear a supportive bra while sleeping if your breasts are very tender.   Medicines  Take over-the-counter and prescription medicines only as told by your health care provider. If the cause of your pain is infection, you may be prescribed an antibiotic medicine.  If you were prescribed an antibiotic, take it as told by your health care provider. Do not stop taking the antibiotic even if you start to feel better. Eating and drinking  Your health care provider may recommend that you lessen the amount of fat in your diet. You can do this by: ? Limiting fried foods. ? Cooking foods using methods such as baking, boiling, grilling, and broiling.  Decrease the amount of caffeine in your diet. Instead, drink more water and choose caffeine-free drinks. General instructions  Keep a log of the days and times when your breasts are most tender.  Ask your health care provider how to do breast exams at home.  This will help you notice if you have an unusual growth or lump.  Keep all follow-up visits as told by your health care provider. This is important.   Contact a health care provider if:  Any part of your breast is hard, red, and hot to the touch. This may be a sign of infection.  You are a woman and: ? Not breastfeeding and you have fluid, especially blood or pus, coming out of your nipples. ? Have a new or painful lump in your breast that remains after your menstrual period ends.  You have a fever.  Your pain does not improve or it gets worse.  Your pain is interfering with your daily activities. Summary  Breast tenderness may range from mild discomfort to severe pain.  Breast tenderness has many possible causes, including hormone changes, infections, and taking certain medicines.  It can be treated with ice, wearing a supportive bra, and medicines.  Make changes to your diet if told to by your health care provider. This information is not intended to replace advice given to you by your health care provider. Make sure you discuss any questions you have with your health care provider. Document Revised: 07/12/2018 Document Reviewed: 07/12/2018 Elsevier Patient Education  2021 Elsevier Inc.   Eszopiclone tablets What is this medicine? ESZOPICLONE (es ZOE pi clone) is used to treat insomnia. This medicine helps you to fall asleep and sleep through the night. This medicine may be used for other purposes; ask  your health care provider or pharmacist if you have questions. COMMON BRAND NAME(S): Lunesta What should I tell my health care provider before I take this medicine? They need to know if you have any of these conditions:  depression  history of a drug or alcohol abuse problem  liver disease  lung or breathing disease  sleep-walking, driving, eating or other activity while not fully awake after taking a sleep medicine  suicidal thoughts  an unusual or allergic  reaction to eszopiclone, other medicines, foods, dyes, or preservatives  pregnant or trying to get pregnant  breast-feeding How should I use this medicine? Take this medicine by mouth with a glass of water. Follow the directions on the prescription label. It is better to take this medicine on an empty stomach and only when you are ready for bed. Do not take your medicine more often than directed. If you have been taking this medicine for several weeks and suddenly stop taking it, you may get unpleasant withdrawal symptoms. Your doctor or health care professional may want to gradually reduce the dose. Do not stop taking this medicine on your own. Always follow your doctor or health care professional's advice. Talk to your pediatrician regarding the use of this medicine in children. Special care may be needed. Overdosage: If you think you have taken too much of this medicine contact a poison control center or emergency room at once. NOTE: This medicine is only for you. Do not share this medicine with others. What if I miss a dose? This does not apply. This medicine should only be taken immediately before going to sleep. Do not take double or extra doses. What may interact with this medicine?  herbal medicines like kava kava, melatonin, St. John's wort and valerian  lorazepam  medicines for fungal infections like ketoconazole, fluconazole, or itraconazole  olanzapine This list may not describe all possible interactions. Give your health care provider a list of all the medicines, herbs, non-prescription drugs, or dietary supplements you use. Also tell them if you smoke, drink alcohol, or use illegal drugs. Some items may interact with your medicine. What should I watch for while using this medicine? Visit your doctor or health care professional for regular checks on your progress. Keep a regular sleep schedule by going to bed at about the same time nightly. Avoid caffeine-containing drinks in the  evening hours, as caffeine can cause trouble with falling asleep. Talk to your doctor if you still have trouble sleeping. After taking this medicine, you may get up out of bed and do an activity that you do not know you are doing. The next morning, you may have no memory of this. Activities include driving a car ("sleep-driving"), making and eating food, talking on the phone, sexual activity, and sleep-walking. Serious injuries have occurred. Stop the medicine and call your doctor right away if you find out you have done any of these activities. Do not take this medicine if you have used alcohol that evening. Do not take it if you have taken another medicine for sleep. The risk of doing these sleep-related activities is higher. Do not take this medicine unless you are able to stay in bed for a full night (7 to 8 hours) before you must be active again. You may have a decrease in mental alertness the day after use, even if you feel that you are fully awake. Tell your doctor if you will need to perform activities requiring full alertness, such as driving, the next day.  Do not stand or sit up quickly after taking this medicine, especially if you are an older patient. This reduces the risk of dizzy or fainting spells. If you or your family notice any changes in your behavior, such as new or worsening depression, thoughts of harming yourself, anxiety, other unusual or disturbing thoughts, or memory loss, call your doctor right away. After you stop taking this medicine, you may have trouble falling asleep. This is called rebound insomnia. This problem usually goes away on its own after 1 or 2 nights. What side effects may I notice from receiving this medicine? Side effects that you should report to your doctor or health care professional as soon as possible:  allergic reactions like skin rash, itching or hives, swelling of the face, lips, or tongue  changes in vision  confusion  depressed mood  feeling  faint or lightheaded, falls  hallucinations  problems with balance, speaking, walking  restlessness, excitability, or feelings of agitation  unusual activities while not fully awake like driving, eating, making phone calls Side effects that usually do not require medical attention (report to your doctor or health care professional if they continue or are bothersome):  dizziness, or daytime drowsiness, sometimes called a hangover effect  headache This list may not describe all possible side effects. Call your doctor for medical advice about side effects. You may report side effects to FDA at 1-800-FDA-1088. Where should I keep my medicine? Keep out of the reach of children. This medicine can be abused. Keep your medicine in a safe place to protect it from theft. Do not share this medicine with anyone. Selling or giving away this medicine is dangerous and against the law. This medicine may cause accidental overdose and death if taken by other adults, children, or pets. Mix any unused medicine with a substance like cat litter or coffee grounds. Then throw the medicine away in a sealed container like a sealed bag or a coffee can with a lid. Do not use the medicine after the expiration date. Store at room temperature between 15 and 30 degrees C (59 and 86 degrees F). NOTE: This sheet is a summary. It may not cover all possible information. If you have questions about this medicine, talk to your doctor, pharmacist, or health care provider.  2021 Elsevier/Gold Standard (2017-08-14 11:57:05)

## 2020-07-06 NOTE — Progress Notes (Addendum)
Chief Complaint  Patient presents with   Follow-up   F/u  1. Left breast tenderness chronic diag mammo and [place] 04/17/20 negative and bl screening due 08/2020 she feels like breast size is increasing used to be 34 B with age and time D cup  2. Insomnia due to worry about daughter locally and granddaughter in [place] who has been there with her dad x 3 years  She is getting 3 hours of sleep tried otc supplements not working and when does go to sleep husband is waking up for the am and she is tired  Review of Systems  Constitutional: Negative for weight loss.  HENT: Negative for hearing loss.   Eyes: Negative for blurred vision.  Respiratory: Negative for shortness of breath.   Cardiovascular: Negative for chest pain.  Genitourinary:       +left breast pain  Skin: Negative for rash.  Psychiatric/Behavioral: The patient is nervous/anxious and has insomnia.    Past Medical History:  Diagnosis Date   Anxiety    Arthritis    right hand, DDD L 4 , L 5   Asthma    Blood in stool    Chronic intermittent post-traumatic headache    Colon polyps    Complication of anesthesia 2018   difficulty waking up    Depression    Family history of adverse reaction to anesthesia    Sister woke up during surgery   GERD (gastroesophageal reflux disease)    Head injury    Headache    h/o migraines    Hepatitis    Hepatitis C    treated with mavyret per GI   History of blood transfusion    Hypertension    Laryngopharyngeal reflux (LPR)    Multiple falls    Pancreatitis    remote hx w/o h/o drinking at that time   Seizures (HCC)     after head injury   Vitamin D deficiency    Past Surgical History:  Procedure Laterality Date   ABDOMINAL HYSTERECTOMY  1979   has an ovary left s/p hysterectomy 1979; h/o abnormal pap    acdf     08/07/16 Dr. Yevette Edwards    ANTERIOR CERVICAL DECOMP/DISCECTOMY FUSION N/A 08/07/2016   Procedure: ANTERIOR CERVICAL DECOMPRESSION FUSION, CERVICAL FOUR-FIVE  WITH INSTRUMENTATION  AND ALLOGRAFT;  Surgeon: Estill Bamberg, MD;  Location: [facility] OR;  Service: Orthopedics;  Laterality: N/A;   APPENDECTOMY     BREAST BIOPSY Left    over 20 years ago negative left breast as of 03/2020    CESAREAN SECTION     COLON RESECTION     paritial ? reason done in [place]    COLONOSCOPY     EXPLORATORY LAPAROTOMY     after C Section  x 2 - 1 for bleeding and 1 for infection   HERNIA REPAIR     Inguinal - as a child   low back surgery     Dr. Yevette Edwards    TONSILLECTOMY     TRANSFORAMINAL LUMBAR INTERBODY FUSION (TLIF) WITH PEDICLE SCREW FIXATION 1 LEVEL Right 03/24/2018   Procedure: RIGHT LUMBAR 4 - LUMBAR 5 TRANSFORAMINAL LUMBAR INTERBODY FUSION WITH INSTRUMENTATION AND ALLOGRAFT;  Surgeon: Estill Bamberg, MD;  Location: [facility] OR;  Service: Orthopedics;  Laterality: Right;   Family History  Problem Relation Age of Onset   Breast cancer Sister 26   Cancer Sister        breast    Diabetes Sister    Miscarriages / [place] Sister  Cancer Mother        breast/precancer changes   Heart disease Mother    Hypertension Mother    Miscarriages / [place] Mother    Heart failure Mother    Multiple sclerosis Father    Miscarriages / [place] Daughter    Arthritis Maternal Grandmother    Hyperlipidemia Maternal Grandmother    Learning disabilities Maternal Grandmother    Cancer Maternal Grandfather    Diabetes Paternal Grandmother    Arthritis Sister    Miscarriages / [place] Sister    Other Sister        precancerous changes breast   Social History   Socioeconomic History   Marital status: Divorced    Spouse name: Not on file   Number of children: Not on file   Years of education: Not on file   Highest education level: Not on file  Occupational History   Not on file  Tobacco Use   Smoking status: Former Smoker    Years: 30.00    Quit date: 2013    Years since quitting: 9.3   Smokeless tobacco: Never Used  Building services engineer Use: Never used  Substance and  Sexual Activity   Alcohol use: Yes    Alcohol/week: 7.0 standard drinks    Types: 7 Glasses of wine per week    Comment: per week   Drug use: No   Sexual activity: Not on file  Other Topics Concern   Not on file  Social History Narrative   ** Merged History Encounter **       Associate degree  Wears seat belt, feels safe in marriage to Ron 1 daughter    Social Determinants of Corporate investment banker Strain: Not on file  Food Insecurity: Not on file  Transportation Needs: Not on file  Physical Activity: Not on file  Stress: Not on file  Social Connections: Not on file  Intimate Partner Violence: Not on file   Current Meds  Medication Sig   Calcium Carbonate-Vitamin D (CALTRATE 600+D PO) Take 1 tablet by mouth 2 (two) times daily.    eszopiclone (LUNESTA) 2 MG TABS tablet Take 0.5-1 tablets (1-2 mg total) by mouth at bedtime as needed for sleep. Take immediately before bedtime   Multiple Vitamin (MULTIVITAMIN WITH MINERALS) TABS tablet Take 1 tablet by mouth 2 (two) times daily.    PREVIDENT 5000 SENSITIVE 1.1-5 % PSTE Place onto teeth as directed.   [DISCONTINUED] albuterol (PROAIR HFA) 108 (90 Base) MCG/ACT inhaler Inhale 2 puffs into the lungs every 6 (six) hours as needed for wheezing or shortness of breath.   [DISCONTINUED] cyclobenzaprine (FLEXERIL) 10 MG tablet TAKE 1 TABLET(10 MG) BY MOUTH TWICE DAILY AS NEEDED FOR MUSCLE SPASMS   [DISCONTINUED] gabapentin (NEURONTIN) 300 MG capsule TAKE 1 CAPSULE(300 MG) BY MOUTH FOUR TIMES DAILY   [DISCONTINUED] meloxicam (MOBIC) 7.5 MG tablet Take 1 tablet (7.5 mg total) by mouth 2 (two) times daily as needed for pain.   Allergies  Allergen Reactions   Latex Hives   Sulfa Antibiotics Hives   Linaclotide Itching   No results found for this or any previous visit (from the past 2160 hour(s)). Objective  Body mass index is 23.8 kg/m. Wt Readings from Last 3 Encounters:  07/06/20 143 lb (64.9 kg)  03/27/20 143 lb 12.8 oz (65.2  kg)  12/09/19 138 lb (62.6 kg)   Temp Readings from Last 3 Encounters:  07/06/20 98.3 F (36.8 C) (Oral)  03/27/20 97.9 F (  36.6 C) (Oral)  09/29/19 98.3 F (36.8 C) (Oral)   BP Readings from Last 3 Encounters:  07/06/20 126/84  03/27/20 116/88  12/09/19 125/83   Pulse Readings from Last 3 Encounters:  07/06/20 82  03/27/20 81  12/09/19 88    Physical Exam Vitals and nursing note reviewed.  Constitutional:      Appearance: Normal appearance. She is well-developed and well-groomed.  HENT:     Head: Normocephalic and atraumatic.  Cardiovascular:     Rate and Rhythm: Normal rate and regular rhythm.     Heart sounds: Normal heart sounds. No murmur heard.   Pulmonary:     Effort: Pulmonary effort is normal.     Breath sounds: Normal breath sounds.  Skin:    General: Skin is warm and dry.  Neurological:     General: No focal deficit present.     Mental Status: She is alert and oriented to person, place, and time. Mental status is at baseline.     Gait: Gait normal.  Psychiatric:        Attention and Perception: Attention and perception normal.        Mood and Affect: Mood and affect normal.        Speech: Speech normal.        Behavior: Behavior normal. Behavior is cooperative.        Thought Content: Thought content normal.        Cognition and Memory: Cognition and memory normal.        Judgment: Judgment normal.     Assessment  Plan  Insomnia, unspecified type - Plan: eszopiclone (LUNESTA) 2 MG TABS tablet  Lumbar radiculopathy /chronic low back pain- Plan: cyclobenzaprine (FLEXERIL) 10 MG tablet, gabapentin (NEURONTIN) 300 MG capsule  Mild intermittent asthma without complication - Plan: albuterol (PROAIR HFA) 108 (90 Base) MCG/ACT inhaler   Hypertension, unspecified type - Plan: Comprehensive metabolic panel, Lipid panel, CBC with Differential/Platelet Controlled she is off meds I.e dilt 240 mg sr qd Monitor    02/11/21  Oral lichen planus per dental  will refer to ENT and derm Dr. Moshe Cipro  HM Declines flu vx  Sch pfizer 04/06/20 had 2/2 itd Disc Tdap vaccine and rec given Tdap Rx today to get at pharmacy   Consider shingrix in future will need to disc  Consider prevnar and pna 23 vaccines in future  Consider hep B vx in future  h/o hep C tx'ed f/u Dr. Servando Snare 11/29/19 negative not detected    Colonoscopy 2015 get records from [facility]  H/o polyps   Last pap 04/12/15 negative likely insurance will not cover another has medicare  -s/p hysterectomy with h/o cervical changes age 79 1 ovary intact    DEXA 09/09/2018 -1.8 to -1.9 osteopenia [facility]  f/u in 3-5 years    mammo 09/09/2018 normal [facility] negative ordered repeat  -h/o breast cyst per pt and asymmetries per last mammogram + FH breast cancer mother and sister pt agreeable  -consider [facility] genetics testing in future  04/17/20 [place] and mammo dx left negative -agreeable to [facility]o 09/22/19 negative ordered again  Breast pain continues consider MRI breast and breast surgery   Former smoker 30 years quit in 2010 1-1.5 pk would last 1 week no FH lung cancer. -calc risk score 2.4% consider CT chest lung cancer screening in future disc at f/u     Provider: Dr. French Ana McLean-Scocuzza-[facility]

## 2020-07-27 ENCOUNTER — Ambulatory Visit (INDEPENDENT_AMBULATORY_CARE_PROVIDER_SITE_OTHER): Payer: Medicare Other

## 2020-07-27 VITALS — Ht 65.0 in | Wt 143.0 lb

## 2020-07-27 DIAGNOSIS — Z0000 Encounter for general adult medical examination without abnormal findings: Secondary | ICD-10-CM | POA: Diagnosis not present

## 2020-07-27 NOTE — Progress Notes (Signed)
Subjective:   Amy Grant is a 70 y.o. female who presents for Medicare Annual (Subsequent) preventive examination.  Review of Systems    No ROS.  Medicare Wellness Virtual Visit.  Visual/audio telehealth visit, UTA vital signs.   See social history for additional risk factors.   Cardiac Risk Factors include: advanced age (>3555men, 71>65 women)     Objective:    Today's Vitals   07/27/20 1239  Weight: 143 lb (64.9 kg)  Height: 5\' 5"  (1.651 m)   Body mass index is 23.8 kg/m.  Advanced Directives 07/27/2020 07/27/2019 03/25/2018 03/18/2018 12/24/2017 08/07/2016 08/01/2016  Does Patient Have a Medical Advance Directive? No No Yes Yes No No No  Type of Advance Directive - - Living will;Healthcare Power of Attorney Living will;Healthcare Power of Attorney - - -  Does patient want to make changes to medical advance directive? - - No - Patient declined No - Patient declined - - -  Copy of Healthcare Power of Attorney in Chart? - - No - copy requested No - copy requested - - -  Would patient like information on creating a medical advance directive? No - Patient declined Yes (MAU/Ambulatory/Procedural Areas - Information given) - - Yes (MAU/Ambulatory/Procedural Areas - Information given) Yes (MAU/Ambulatory/Procedural Areas - Information given) Yes (MAU/Ambulatory/Procedural Areas - Information given)    Current Medications (verified) Outpatient Encounter Medications as of 07/27/2020  Medication Sig  . albuterol (PROAIR HFA) 108 (90 Base) MCG/ACT inhaler Inhale 2 puffs into the lungs every 6 (six) hours as needed for wheezing or shortness of breath.  . Calcium Carbonate-Vitamin D (CALTRATE 600+D PO) Take 1 tablet by mouth 2 (two) times daily.   . cyclobenzaprine (FLEXERIL) 10 MG tablet TAKE 1 TABLET(10 MG) BY MOUTH TWICE DAILY AS NEEDED FOR MUSCLE SPASMS  . eszopiclone (LUNESTA) 2 MG TABS tablet Take 0.5-1 tablets (1-2 mg total) by mouth at bedtime as needed for sleep. Take immediately before  bedtime  . gabapentin (NEURONTIN) 300 MG capsule Take 2 capsules (600 mg total) by mouth 2 (two) times daily.  . meloxicam (MOBIC) 7.5 MG tablet Take 1 tablet (7.5 mg total) by mouth 2 (two) times daily as needed for pain.  . Multiple Vitamin (MULTIVITAMIN WITH MINERALS) TABS tablet Take 1 tablet by mouth 2 (two) times daily.   Marland Kitchen. PREVIDENT 5000 SENSITIVE 1.1-5 % PSTE Place onto teeth as directed.   No facility-administered encounter medications on file as of 07/27/2020.    Allergies (verified) Latex, Sulfa antibiotics, and Linaclotide   History: Past Medical History:  Diagnosis Date  . Anxiety   . Arthritis    right hand, DDD L 4 , L 5  . Asthma   . Blood in stool   . Chronic intermittent post-traumatic headache   . Colon polyps   . Complication of anesthesia 2018   difficulty waking up   . Depression   . Family history of adverse reaction to anesthesia    Sister woke up during surgery  . GERD (gastroesophageal reflux disease)   . Head injury   . Headache    h/o migraines   . Hepatitis   . Hepatitis C    treated with mavyret per GI  . History of blood transfusion   . Hypertension   . Laryngopharyngeal reflux (LPR)   . Multiple falls   . Pancreatitis    remote hx w/o h/o drinking at that time  . Seizures (HCC)     after head injury  . Vitamin D  deficiency    Past Surgical History:  Procedure Laterality Date  . ABDOMINAL HYSTERECTOMY  1979   has an ovary left s/p hysterectomy 1979; h/o abnormal pap   . acdf     08/07/16 Dr. Yevette Edwards   . ANTERIOR CERVICAL DECOMP/DISCECTOMY FUSION N/A 08/07/2016   Procedure: ANTERIOR CERVICAL DECOMPRESSION FUSION, CERVICAL FOUR-FIVE  WITH INSTRUMENTATION AND ALLOGRAFT;  Surgeon: Estill Bamberg, MD;  Location: [facility] OR;  Service: Orthopedics;  Laterality: N/A;  . APPENDECTOMY    . BREAST BIOPSY Left    over 20 years ago negative left breast as of 03/2020   . CESAREAN SECTION    . COLON RESECTION     paritial ? reason done in [place]   .  COLONOSCOPY    . EXPLORATORY LAPAROTOMY     after C Section  x 2 - 1 for bleeding and 1 for infection  . HERNIA REPAIR     Inguinal - as a child  . low back surgery     Dr. Yevette Edwards   . TONSILLECTOMY    . TRANSFORAMINAL LUMBAR INTERBODY FUSION (TLIF) WITH PEDICLE SCREW FIXATION 1 LEVEL Right 03/24/2018   Procedure: RIGHT LUMBAR 4 - LUMBAR 5 TRANSFORAMINAL LUMBAR INTERBODY FUSION WITH INSTRUMENTATION AND ALLOGRAFT;  Surgeon: Estill Bamberg, MD;  Location: [facility] OR;  Service: Orthopedics;  Laterality: Right;   Family History  Problem Relation Age of Onset  . Breast cancer Sister 85  . Cancer Sister        breast   . Diabetes Sister   . Miscarriages / Stillbirths Sister   . Cancer Mother        breast/precancer changes  . Heart disease Mother   . Hypertension Mother   . Miscarriages / [place] Mother   . Heart failure Mother   . Multiple sclerosis Father   . Miscarriages / [place] Daughter   . Arthritis Maternal Grandmother   . Hyperlipidemia Maternal Grandmother   . Learning disabilities Maternal Grandmother   . Cancer Maternal Grandfather   . Diabetes Paternal Grandmother   . Arthritis Sister   . Miscarriages / Stillbirths Sister   . Other Sister        precancerous changes breast   Social History   Socioeconomic History  . Marital status: Divorced    Spouse name: Not on file  . Number of children: Not on file  . Years of education: Not on file  . Highest education level: Not on file  Occupational History  . Not on file  Tobacco Use  . Smoking status: Former Smoker    Years: 30.00    Quit date: 2013    Years since quitting: 9.4  . Smokeless tobacco: Never Used  Vaping Use  . Vaping Use: Never used  Substance and Sexual Activity  . Alcohol use: Yes    Alcohol/week: 7.0 standard drinks    Types: 7 Glasses of wine per week    Comment: per week  . Drug use: No  . Sexual activity: Not on file  Other Topics Concern  . Not on file  Social History Narrative    ** Merged History Encounter **       Associate degree  Wears seat belt, feels safe in marriage to Ron 1 daughter    Social Determinants of Corporate investment banker Strain: Not on file  Food Insecurity: No Food Insecurity  . Worried About Programme researcher, broadcasting/film/video in the Last Year: Never true  . Ran Out of Food in the  Last Year: Never true  Transportation Needs: No Transportation Needs  . Lack of Transportation (Medical): No  . Lack of Transportation (Non-Medical): No  Physical Activity: Insufficiently Active  . Days of Exercise per Week: 4 days  . Minutes of Exercise per Session: 30 min  Stress: No Stress Concern Present  . Feeling of Stress : Only a little  Social Connections: Unknown  . Frequency of Communication with Friends and Family: More than three times a week  . Frequency of Social Gatherings with Friends and Family: More than three times a week  . Attends Religious Services: Not on file  . Active Member of Clubs or Organizations: Not on file  . Attends Banker Meetings: Not on file  . Marital Status: Not on file    Tobacco Counseling Counseling given: Not Answered   Clinical Intake:  Pre-visit preparation completed: Yes        Diabetes: No  How often do you need to have someone help you when you read instructions, pamphlets, or other written materials from your doctor or pharmacy?: 1 - Never  Interpreter Needed?: No      Activities of Daily Living In your present state of health, do you have any difficulty performing the following activities: 07/27/2020  Hearing? N  Vision? N  Difficulty concentrating or making decisions? N  Walking or climbing stairs? N  Dressing or bathing? N  Doing errands, shopping? N  Preparing Food and eating ? N  Using the Toilet? N  In the past six months, have you accidently leaked urine? N  Do you have problems with loss of bowel control? N  Managing your Medications? N  Managing your Finances? N   Housekeeping or managing your Housekeeping? N  Some recent data might be hidden    Patient Care Team: McLean-Scocuzza, Pasty Spillers, MD as PCP - General (Internal Medicine)  Indicate any recent Medical Services you may have received from other than [facility] providers in the past year (date may be approximate).     Assessment:   This is a routine wellness examination for Kahmari.  I connected with Armanie today by telephone and verified that I am speaking with the correct person using two identifiers. Location patient: home Location provider: work Persons participating in the virtual visit: patient, Engineer, civil (consulting).    I discussed the limitations, risks, security and privacy concerns of performing an evaluation and management service by telephone and the availability of in person appointments. The patient expressed understanding and verbally consented to this telephonic visit.    Interactive audio and video telecommunications were attempted between this provider and patient, however failed, due to patient having technical difficulties OR patient did not have access to video capability.  We continued and completed visit with audio only.  Some vital signs may be absent or patient reported.   Hearing/Vision screen  Hearing Screening   125Hz  250Hz  500Hz  1000Hz  2000Hz  3000Hz  4000Hz  6000Hz  8000Hz   Right ear:           Left ear:           Comments: Patient is able to hear conversational tones without difficulty.  No issues reported.  Vision Screening Comments: Followed by [facility]  Wears corrective lenses  Annual visits    Dietary issues and exercise activities discussed: Current Exercise Habits: Home exercise routine, Type of exercise: strength training/weights;stretching, Time (Minutes): 30, Frequency (Times/Week): 4, Weekly Exercise (Minutes/Week): 120, Intensity: Mild  Healthy diet Good water intake  Goals Addressed  This Visit's Progress   . Follow up with PCP as needed        Healthy diet Stay active       Depression Screen PHQ 2/9 Scores 07/27/2020 12/09/2019 09/29/2019 08/09/2019 07/27/2019 06/30/2019 12/24/2017  PHQ - 2 Score 0 0 0 0 0 0 0    Fall Risk Fall Risk  07/27/2020 07/06/2020 03/27/2020 12/09/2019 09/29/2019  Falls in the past year? 0 0 0 0 0  Number falls in past yr: 0 0 0 0 0  Injury with Fall? 0 0 0 0 0  Risk for fall due to : - - - - No Fall Risks  Follow up Falls evaluation completed Falls evaluation completed Falls evaluation completed Falls evaluation completed Falls evaluation completed    FALL RISK PREVENTION PERTAINING TO THE HOME: Handrails in use when climbing stairs? Yes Home free of loose throw rugs in walkways, pet beds, electrical cords, etc? Yes  Adequate lighting in your home to reduce risk of falls? Yes   ASSISTIVE DEVICES UTILIZED TO PREVENT FALLS: Life alert? No  Use of a cane, walker or w/c? No   TIMED UP AND GO: Was the test performed? No .   Cognitive Function: Patient is alert and oriented x3.  Enjoys reading and other brain health activities.  Denies difficulty focusing, making decisions, memory loss.  MMSE/6CIT deferred. Normal by direct communication/observation.   MMSE - Mini Mental State Exam 12/24/2017  Orientation to time 5  Orientation to Place 5  Registration 3  Attention/ Calculation 5  Recall 3  Language- name 2 objects 2  Language- repeat 1  Language- follow 3 step command 3  Language- read & follow direction 1  Write a sentence 1  Copy design 1  Total score 30     6CIT Screen 07/27/2019  What Year? 0 points  What month? 0 points  What time? 0 points  Months in reverse 0 points    Immunizations Immunization History  Administered Date(s) Administered  . PFIZER Comirnaty(Gray Top)Covid-19 Tri-Sucrose Vaccine 04/17/2020  . PFIZER(Purple Top)SARS-COV-2 Vaccination 08/13/2019, 09/10/2019   Health Maintenance Health Maintenance  Topic Date Due  . COLONOSCOPY (Pts 45-81yrs Insurance  coverage will need to be confirmed)  07/27/2021 (Originally 07/28/1997)  . TETANUS/TDAP  07/27/2021 (Originally 07/29/1971)  . PNA vac Low Risk Adult (1 of 2 - PCV13) 07/27/2021 (Originally 07/28/2017)  . Zoster Vaccines- Shingrix (1 of 2) 07/27/2021 (Originally 07/29/2002)  . INFLUENZA VACCINE  10/01/2020  . MAMMOGRAM  09/21/2021  . DEXA SCAN  Completed  . COVID-19 Vaccine  Completed  . Hepatitis C Screening  Completed  . HPV VACCINES  Aged Out   Colonoscopy- declined.   Mammogram status: Completed 09/22/19. Repeat every year. Ordered 07/17/20.  Lung Cancer Screening: (Low Dose CT Chest recommended if Age 20-80 years, 30 pack-year currently smoking OR have quit w/in 15years.) does not qualify.   All routine vaccines- declined.  Vision Screening: Recommended annual ophthalmology exams for early detection of glaucoma and other disorders of the eye. Is the patient up to date with their annual eye exam?  Yes  Who is the provider or what is the name of the office in which the patient attends annual eye exams? [facility].  Dental Screening: Recommended annual dental exams for proper oral hygiene. Visits every 6 months.   Community Resource Referral / Chronic Care Management: CRR required this visit?  No   CCM required this visit?  No      Plan:  Keep all routine maintenance appointments.   I have personally reviewed and noted the following in the patient's chart:   . Medical and social history . Use of alcohol, tobacco or illicit drugs  . Current medications and supplements including opioid prescriptions.  . Functional ability and status . Nutritional status . Physical activity . Advanced directives . List of other physicians . Hospitalizations, surgeries, and ER visits in previous 12 months . Vitals . Screenings to include cognitive, depression, and falls . Referrals and appointments  In addition, I have reviewed and discussed with patient certain preventive  protocols, quality metrics, and best practice recommendations. A written personalized care plan for preventive services as well as general preventive health recommendations were provided to patient via mychart.     Ashok Pall, LPN   [id number]

## 2020-07-27 NOTE — Patient Instructions (Addendum)
Amy Grant , Thank you for taking time to come for your Medicare Wellness Visit. I appreciate your ongoing commitment to your health goals. Please review the following plan we discussed and let me know if I can assist you in the future.   These are the goals we discussed: Goals    . Follow up with PCP as needed     Healthy diet Stay active        This is a list of the screening recommended for you and due dates:  Health Maintenance  Topic Date Due  . Colon Cancer Screening  07/27/2021*  . Tetanus Vaccine  07/27/2021*  . Pneumonia vaccines (1 of 2 - PCV13) 07/27/2021*  . Zoster (Shingles) Vaccine (1 of 2) 07/27/2021*  . Flu Shot  10/01/2020  . Mammogram  09/21/2021  . DEXA scan (bone density measurement)  Completed  . COVID-19 Vaccine  Completed  . Hepatitis C Screening: USPSTF Recommendation to screen - Ages 77-79 yo.  Completed  . HPV Vaccine  Aged Out  *Topic was postponed. The date shown is not the original due date.    Immunizations Immunization History  Administered Date(s) Administered  . PFIZER Comirnaty(Gray Top)Covid-19 Tri-Sucrose Vaccine 04/17/2020  . PFIZER(Purple Top)SARS-COV-2 Vaccination 08/13/2019, 09/10/2019    Advanced directives: not yet completed.   Conditions/risks identified: none new.  Follow up in one year for your annual wellness visit.   Preventive Care 10 Years and Older, Female Preventive care refers to lifestyle choices and visits with your health care provider that can promote health and wellness. What does preventive care include?  A yearly physical exam. This is also called an annual well check.  Dental exams once or twice a year.  Routine eye exams. Ask your health care provider how often you should have your eyes checked.  Personal lifestyle choices, including:  Daily care of your teeth and gums.  Regular physical activity.  Eating a healthy diet.  Avoiding tobacco and drug use.  Limiting alcohol use.  Practicing safe  sex.  Taking low doses of aspirin every day.  Taking vitamin and mineral supplements as recommended by your health care provider. What happens during an annual well check? The services and screenings done by your health care provider during your annual well check will depend on your age, overall health, lifestyle risk factors, and family history of disease. Counseling  Your health care provider may ask you questions about your:  Alcohol use.  Tobacco use.  Drug use.  Emotional well-being.  Home and relationship well-being.  Sexual activity.  Eating habits.  History of falls.  Memory and ability to understand (cognition).  Work and work Astronomer. Screening  You may have the following tests or measurements:  Height, weight, and BMI.  Blood pressure.  Lipid and cholesterol levels. These may be checked every 5 years, or more frequently if you are over 66 years old.  Skin check.  Lung cancer screening. You may have this screening every year starting at age 45 if you have a 30-pack-year history of smoking and currently smoke or have quit within the past 15 years.  Fecal occult blood test (FOBT) of the stool. You may have this test every year starting at age 61.  Flexible sigmoidoscopy or colonoscopy. You may have a sigmoidoscopy every 5 years or a colonoscopy every 10 years starting at age 78.  Prostate cancer screening. Recommendations will vary depending on your family history and other risks.  Hepatitis C blood test.  Hepatitis B  blood test.  Sexually transmitted disease (STD) testing.  Diabetes screening. This is done by checking your blood sugar (glucose) after you have not eaten for a while (fasting). You may have this done every 1-3 years.  Abdominal aortic aneurysm (AAA) screening. You may need this if you are a current or former smoker.  Osteoporosis. You may be screened starting at age 80 if you are at high risk. Talk with your health care provider  about your test results, treatment options, and if necessary, the need for more tests. Vaccines  Your health care provider may recommend certain vaccines, such as:  Influenza vaccine. This is recommended every year.  Tetanus, diphtheria, and acellular pertussis (Tdap, Td) vaccine. You may need a Td booster every 10 years.  Zoster vaccine. You may need this after age 55.  Pneumococcal 13-valent conjugate (PCV13) vaccine. One dose is recommended after age 9.  Pneumococcal polysaccharide (PPSV23) vaccine. One dose is recommended after age 57. Talk to your health care provider about which screenings and vaccines you need and how often you need them. This information is not intended to replace advice given to you by your health care provider. Make sure you discuss any questions you have with your health care provider. Document Released: 03/16/2015 Document Revised: 11/07/2015 Document Reviewed: 12/19/2014 Elsevier Interactive Patient Education  2017 [employer].  Fall Prevention in the Home Falls can cause injuries. They can happen to people of all ages. There are many things you can do to make your home safe and to help prevent falls. What can I do on the outside of my home?  Regularly fix the edges of walkways and driveways and fix any cracks.  Remove anything that might make you trip as you walk through a door, such as a raised step or threshold.  Trim any bushes or trees on the path to your home.  Use bright outdoor lighting.  Clear any walking paths of anything that might make someone trip, such as rocks or tools.  Regularly check to see if handrails are loose or broken. Make sure that both sides of any steps have handrails.  Any raised decks and porches should have guardrails on the edges.  Have any leaves, snow, or ice cleared regularly.  Use sand or salt on walking paths during winter.  Clean up any spills in your garage right away. This includes oil or grease  spills. What can I do in the bathroom?  Use night lights.  Install grab bars by the toilet and in the tub and shower. Do not use towel bars as grab bars.  Use non-skid mats or decals in the tub or shower.  If you need to sit down in the shower, use a plastic, non-slip stool.  Keep the floor dry. Clean up any water that spills on the floor as soon as it happens.  Remove soap buildup in the tub or shower regularly.  Attach bath mats securely with double-sided non-slip rug tape.  Do not have throw rugs and other things on the floor that can make you trip. What can I do in the bedroom?  Use night lights.  Make sure that you have a light by your bed that is easy to reach.  Do not use any sheets or blankets that are too big for your bed. They should not hang down onto the floor.  Have a firm chair that has side arms. You can use this for support while you get dressed.  Do not have throw  rugs and other things on the floor that can make you trip. What can I do in the kitchen?  Clean up any spills right away.  Avoid walking on wet floors.  Keep items that you use a lot in easy-to-reach places.  If you need to reach something above you, use a strong step stool that has a grab bar.  Keep electrical cords out of the way.  Do not use floor polish or wax that makes floors slippery. If you must use wax, use non-skid floor wax.  Do not have throw rugs and other things on the floor that can make you trip. What can I do with my stairs?  Do not leave any items on the stairs.  Make sure that there are handrails on both sides of the stairs and use them. Fix handrails that are broken or loose. Make sure that handrails are as long as the stairways.  Check any carpeting to make sure that it is firmly attached to the stairs. Fix any carpet that is loose or worn.  Avoid having throw rugs at the top or bottom of the stairs. If you do have throw rugs, attach them to the floor with carpet  tape.  Make sure that you have a light switch at the top of the stairs and the bottom of the stairs. If you do not have them, ask someone to add them for you. What else can I do to help prevent falls?  Wear shoes that:  Do not have high heels.  Have rubber bottoms.  Are comfortable and fit you well.  Are closed at the toe. Do not wear sandals.  If you use a stepladder:  Make sure that it is fully opened. Do not climb a closed stepladder.  Make sure that both sides of the stepladder are locked into place.  Ask someone to hold it for you, if possible.  Clearly mark and make sure that you can see:  Any grab bars or handrails.  First and last steps.  Where the edge of each step is.  Use tools that help you move around (mobility aids) if they are needed. These include:  Canes.  Walkers.  Scooters.  Crutches.  Turn on the lights when you go into a dark area. Replace any light bulbs as soon as they burn out.  Set up your furniture so you have a clear path. Avoid moving your furniture around.  If any of your floors are uneven, fix them.  If there are any pets around you, be aware of where they are.  Review your medicines with your doctor. Some medicines can make you feel dizzy. This can increase your chance of falling. Ask your doctor what other things that you can do to help prevent falls. This information is not intended to replace advice given to you by your health care provider. Make sure you discuss any questions you have with your health care provider. Document Released: 12/14/2008 Document Revised: 07/26/2015 Document Reviewed: 03/24/2014 Elsevier Interactive Patient Education  2017 [employer].

## 2020-07-29 ENCOUNTER — Other Ambulatory Visit: Payer: Self-pay | Admitting: Internal Medicine

## 2020-07-29 DIAGNOSIS — M545 Low back pain, unspecified: Secondary | ICD-10-CM

## 2020-07-29 DIAGNOSIS — G8929 Other chronic pain: Secondary | ICD-10-CM

## 2020-07-29 DIAGNOSIS — I10 Essential (primary) hypertension: Secondary | ICD-10-CM

## 2020-08-02 ENCOUNTER — Other Ambulatory Visit: Payer: Self-pay | Admitting: Internal Medicine

## 2020-08-02 ENCOUNTER — Telehealth: Payer: Self-pay | Admitting: Internal Medicine

## 2020-08-02 DIAGNOSIS — I10 Essential (primary) hypertension: Secondary | ICD-10-CM

## 2020-08-02 MED ORDER — VERAPAMIL HCL ER 240 MG PO TBCR: 240.0000 mg | EXTENDED_RELEASE_TABLET | Freq: Every day | ORAL | 3 refills | Status: DC

## 2020-08-02 NOTE — Telephone Encounter (Signed)
Patient calling in and states that she only has 1-2 weeks worth of Verapamil left and needs a refill.   This was taken off the Patient's med list at her 03/2020 appointment. Patient has been seen again 07/2020. Blood pressure was normal at both appointments.   Patient states she has never stopped taking the Verapamil 240 mg once daily. States that those good numbers were with her on the medication. States blood pressure has been good at home as well on the medication.   Okay to add medication back to list and send in? Pended for your approval or denial.

## 2020-08-02 NOTE — Telephone Encounter (Signed)
Yes ok sent to pharmacy she is on this medication refilled x 1 year

## 2020-08-03 NOTE — Telephone Encounter (Signed)
Patient was informed during previous call that this would be sent in for her

## 2020-08-05 IMAGING — US US EXTREM LOW VENOUS*R*
1 series · 13 of 24 positions shown · non-contrast
Comparison: None.

CLINICAL DATA: Right leg pain and swelling for 2 weeks



[Series 1: us extrem low venous*right* · 0.06mm/px · 13 of 34 slices shown]
[im 1/34]
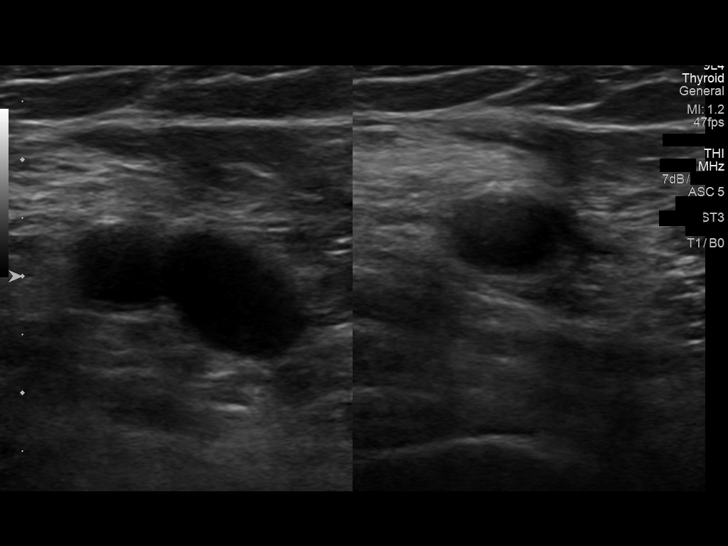
[im 3/34]
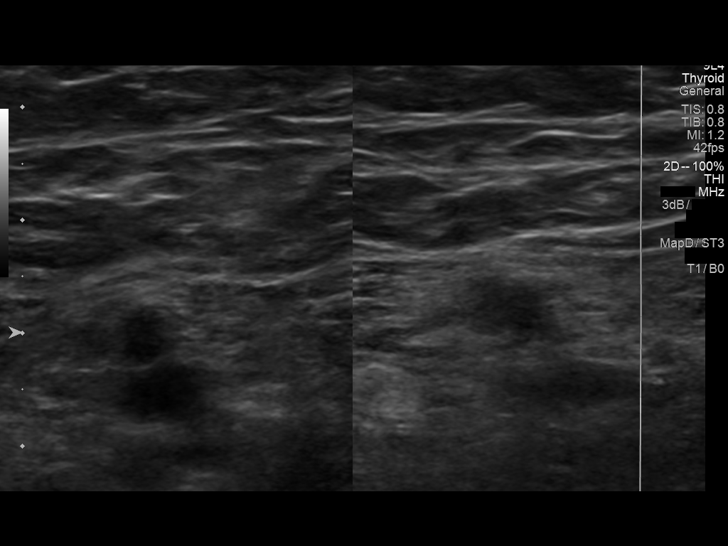
[im 6/34]
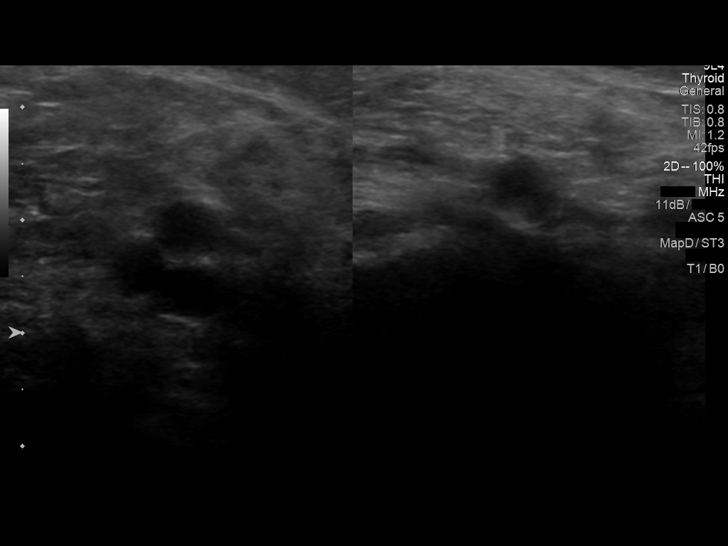
[im 9/34]
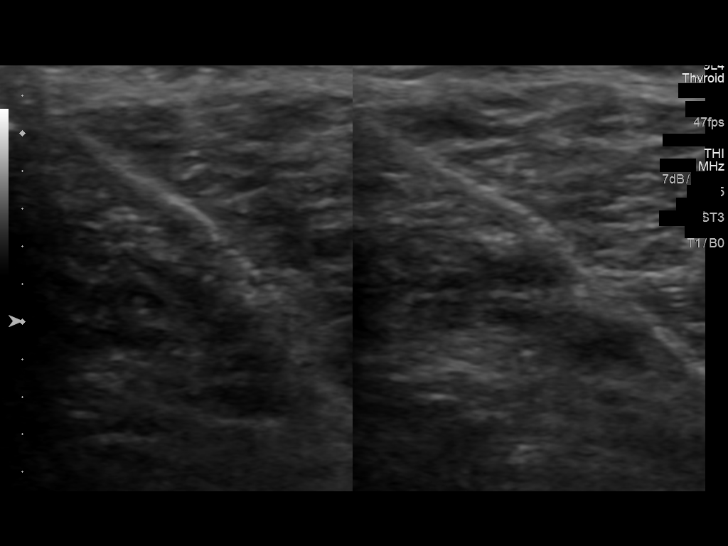
[im 12/34]
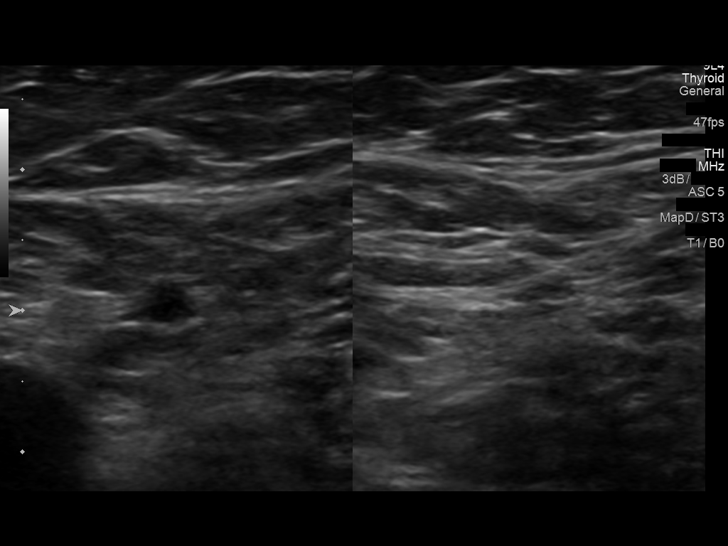
[im 15/34]
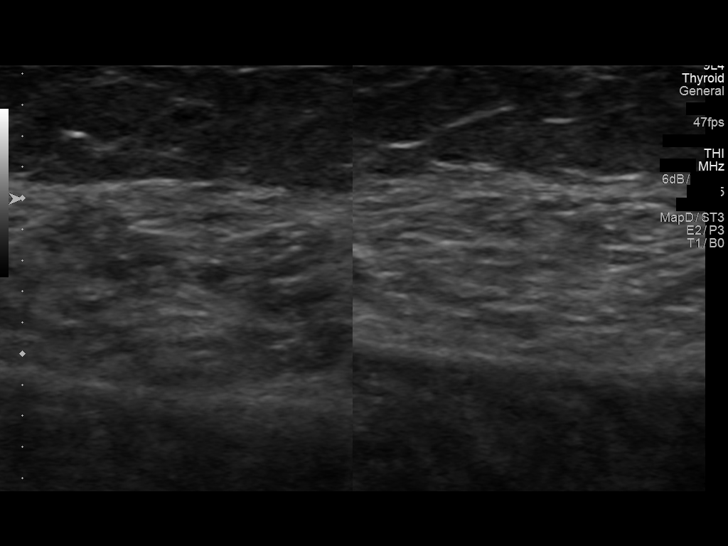
[im 18/34]
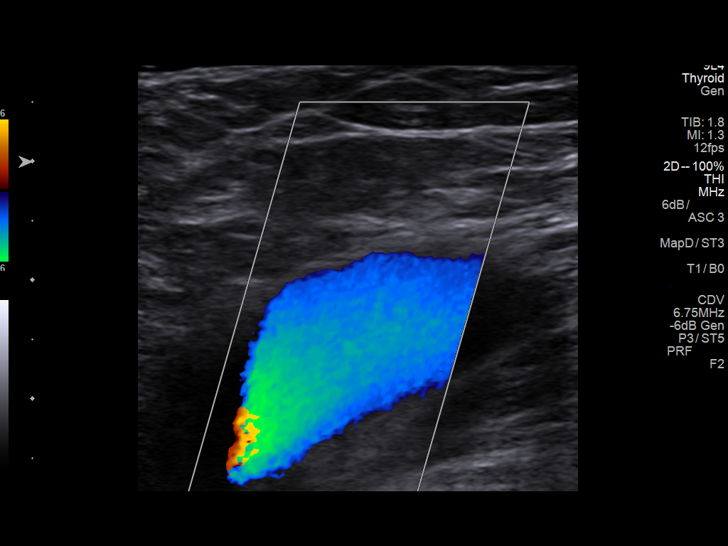
[im 19/34]
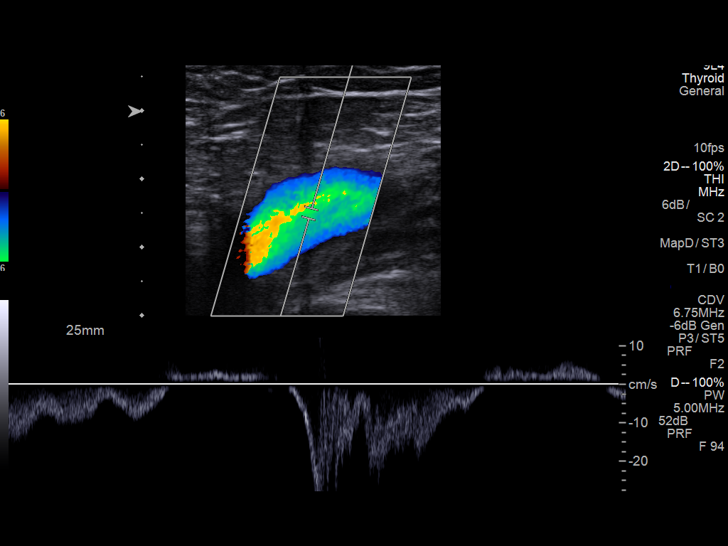
[im 22/34]
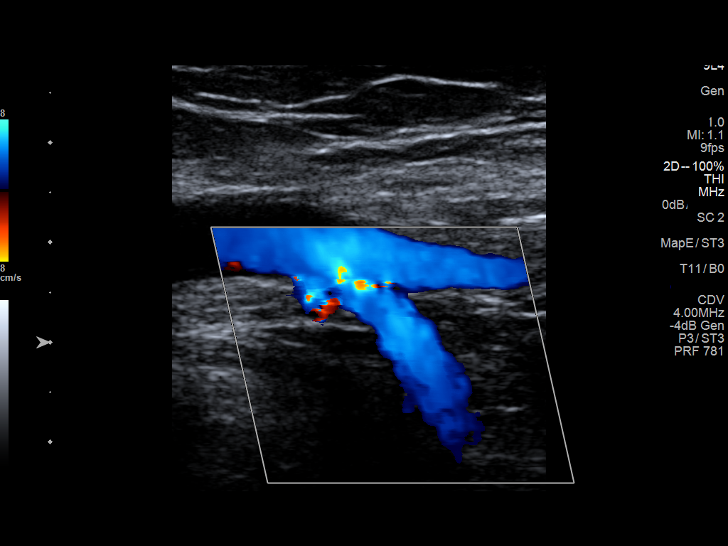
[im 25/34]
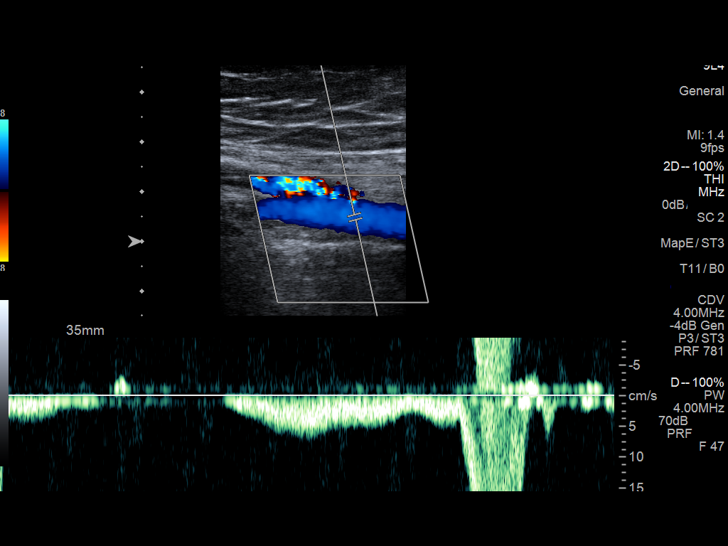
[im 28/34]
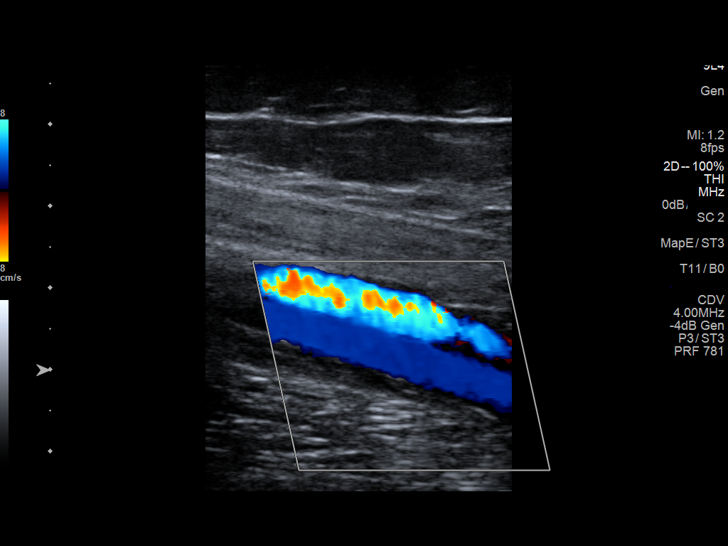
[im 31/34]
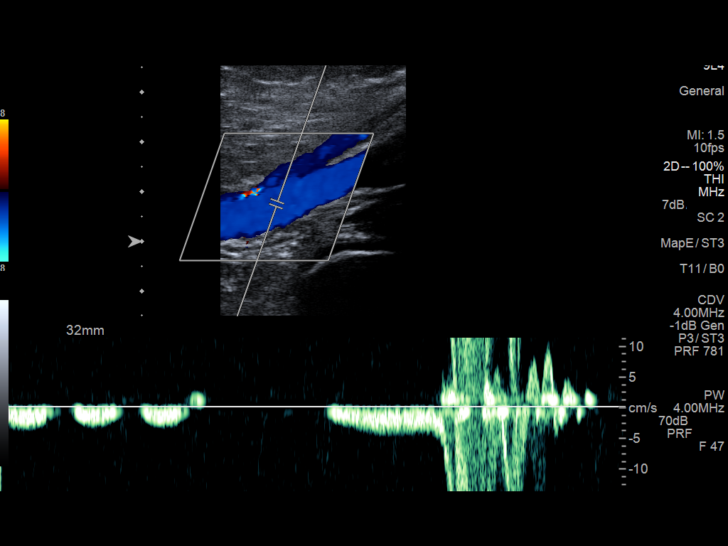
[im 34/34]
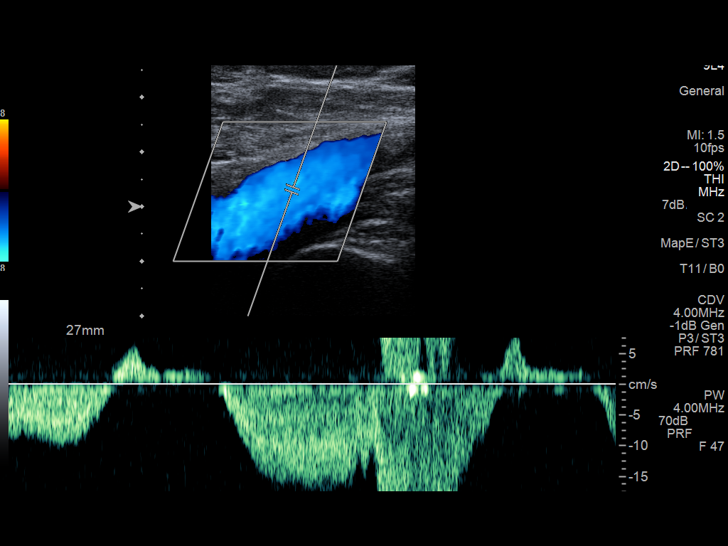

[13 of 24 positions shown; findings below may reference images not displayed]

FINDINGS: Contralateral Common Femoral Vein: Respiratory phasicity is normal
and symmetric with the symptomatic side. No evidence of thrombus.
Normal compressibility.

Common Femoral Vein: No evidence of thrombus. Normal
compressibility, respiratory phasicity and response to augmentation.

Saphenofemoral Junction: No evidence of thrombus. Normal
compressibility and flow on color Doppler imaging.

Profunda Femoral Vein: No evidence of thrombus. Normal
compressibility and flow on color Doppler imaging.

Femoral Vein: No evidence of thrombus. Normal compressibility,
respiratory phasicity and response to augmentation.

Popliteal Vein: No evidence of thrombus. Normal compressibility,
respiratory phasicity and response to augmentation.

Calf Veins: No evidence of thrombus. Normal compressibility and flow
on color Doppler imaging.

Superficial Great Saphenous Vein: No evidence of thrombus. Normal
compressibility.

Venous Reflux:  None.

Other Findings:  None.
IMPRESSION: No evidence of deep venous thrombosis.

## 2020-09-14 ENCOUNTER — Other Ambulatory Visit (INDEPENDENT_AMBULATORY_CARE_PROVIDER_SITE_OTHER): Payer: Medicare Other

## 2020-09-14 ENCOUNTER — Other Ambulatory Visit: Payer: Self-pay

## 2020-09-14 DIAGNOSIS — Z1329 Encounter for screening for other suspected endocrine disorder: Secondary | ICD-10-CM | POA: Diagnosis not present

## 2020-09-14 DIAGNOSIS — Z1389 Encounter for screening for other disorder: Secondary | ICD-10-CM | POA: Diagnosis not present

## 2020-09-14 DIAGNOSIS — I10 Essential (primary) hypertension: Secondary | ICD-10-CM | POA: Diagnosis not present

## 2020-09-14 LAB — CBC WITH DIFFERENTIAL/PLATELET
Basophils Absolute: 0.1 10*3/uL (ref 0.0–0.1)
Basophils Relative: 1.1 % (ref 0.0–3.0)
Eosinophils Absolute: 0.2 10*3/uL (ref 0.0–0.7)
Eosinophils Relative: 4.5 % (ref 0.0–5.0)
HCT: 37.9 % (ref 36.0–46.0)
Hemoglobin: 12.7 g/dL (ref 12.0–15.0)
Lymphocytes Relative: 26.8 % (ref 12.0–46.0)
Lymphs Abs: 1.4 10*3/uL (ref 0.7–4.0)
MCHC: 33.6 g/dL (ref 30.0–36.0)
MCV: 91 fl (ref 78.0–100.0)
Monocytes Absolute: 0.5 10*3/uL (ref 0.1–1.0)
Monocytes Relative: 9.3 % (ref 3.0–12.0)
Neutro Abs: 3 10*3/uL (ref 1.4–7.7)
Neutrophils Relative %: 58.3 % (ref 43.0–77.0)
Platelets: 179 10*3/uL (ref 150.0–400.0)
RBC: 4.16 Mil/uL (ref 3.87–5.11)
RDW: 12.7 % (ref 11.5–15.5)
WBC: 5.1 10*3/uL (ref 4.0–10.5)

## 2020-09-14 LAB — COMPREHENSIVE METABOLIC PANEL
ALT: 16 U/L (ref 0–35)
AST: 21 U/L (ref 0–37)
Albumin: 4.4 g/dL (ref 3.5–5.2)
Alkaline Phosphatase: 77 U/L (ref 39–117)
BUN: 14 mg/dL (ref 6–23)
CO2: 28 mEq/L (ref 19–32)
Calcium: 9.2 mg/dL (ref 8.4–10.5)
Chloride: 106 mEq/L (ref 96–112)
Creatinine, Ser: 1.04 mg/dL (ref 0.40–1.20)
GFR: 55.37 mL/min — ABNORMAL LOW (ref >60.00)
Glucose, Bld: 88 mg/dL (ref 70–99)
Potassium: 4.5 mEq/L (ref 3.5–5.1)
Sodium: 141 mEq/L (ref 135–145)
Total Bilirubin: 1.2 mg/dL (ref 0.2–1.2)
Total Protein: 6.8 g/dL (ref 6.0–8.3)

## 2020-09-14 LAB — LIPID PANEL
Cholesterol: 190 mg/dL (ref 0–200)
HDL: 68.8 mg/dL (ref >39.00)
LDL Cholesterol: 109 mg/dL — ABNORMAL HIGH (ref 0–99)
NonHDL: 121.41
Total CHOL/HDL Ratio: 3
Triglycerides: 60 mg/dL (ref 0.0–149.0)
VLDL: 12 mg/dL (ref 0.0–40.0)

## 2020-09-14 LAB — TSH: TSH: 0.95 u[IU]/mL (ref 0.35–5.50)

## 2020-09-17 ENCOUNTER — Other Ambulatory Visit: Payer: Medicare Other

## 2020-09-28 DIAGNOSIS — Z1231 Encounter for screening mammogram for malignant neoplasm of breast: Secondary | ICD-10-CM | POA: Diagnosis not present

## 2020-09-28 LAB — HM MAMMOGRAPHY

## 2020-09-30 ENCOUNTER — Encounter: Payer: Self-pay | Admitting: Internal Medicine

## 2020-10-23 DIAGNOSIS — M5416 Radiculopathy, lumbar region: Secondary | ICD-10-CM | POA: Diagnosis not present

## 2020-10-24 ENCOUNTER — Other Ambulatory Visit: Payer: Self-pay | Admitting: Orthopedic Surgery

## 2020-10-24 DIAGNOSIS — M5416 Radiculopathy, lumbar region: Secondary | ICD-10-CM

## 2020-11-01 ENCOUNTER — Other Ambulatory Visit: Payer: Self-pay

## 2020-11-01 ENCOUNTER — Ambulatory Visit
Admission: RE | Admit: 2020-11-01 | Discharge: 2020-11-01 | Disposition: A | Discharge status: Home / Self Care | Payer: Medicare Other | Source: Ambulatory Visit | Attending: Orthopedic Surgery | Admitting: Orthopedic Surgery

## 2020-11-01 DIAGNOSIS — M48061 Spinal stenosis, lumbar region without neurogenic claudication: Secondary | ICD-10-CM | POA: Diagnosis not present

## 2020-11-01 DIAGNOSIS — M5416 Radiculopathy, lumbar region: Secondary | ICD-10-CM

## 2020-11-09 DIAGNOSIS — M5416 Radiculopathy, lumbar region: Secondary | ICD-10-CM | POA: Diagnosis not present

## 2020-11-27 DIAGNOSIS — M5416 Radiculopathy, lumbar region: Secondary | ICD-10-CM | POA: Diagnosis not present

## 2020-12-26 ENCOUNTER — Other Ambulatory Visit: Payer: Self-pay | Admitting: Internal Medicine

## 2020-12-26 DIAGNOSIS — M5416 Radiculopathy, lumbar region: Secondary | ICD-10-CM

## 2021-01-11 ENCOUNTER — Ambulatory Visit: Payer: Medicare Other | Admitting: Internal Medicine

## 2021-01-13 ENCOUNTER — Other Ambulatory Visit: Payer: Self-pay | Admitting: Internal Medicine

## 2021-01-13 DIAGNOSIS — M5416 Radiculopathy, lumbar region: Secondary | ICD-10-CM

## 2021-01-20 IMAGING — US US SOFT TISSUE HEAD/NECK
1 series · 13 of 13 positions shown · non-contrast
Comparison: None available.

CLINICAL DATA: Initial evaluation for right pre and postauricular
lymphadenopathy, pain.

EXAM:
ULTRASOUND OF HEAD/NECK SOFT TISSUES
TECHNIQUE: Ultrasound examination of the head and neck soft tissues was
performed in the area of clinical concern.

[Series 1: us soft tissue head/neck · 0.07mm/px · 13 of 13 slices shown]
[im 1/13]
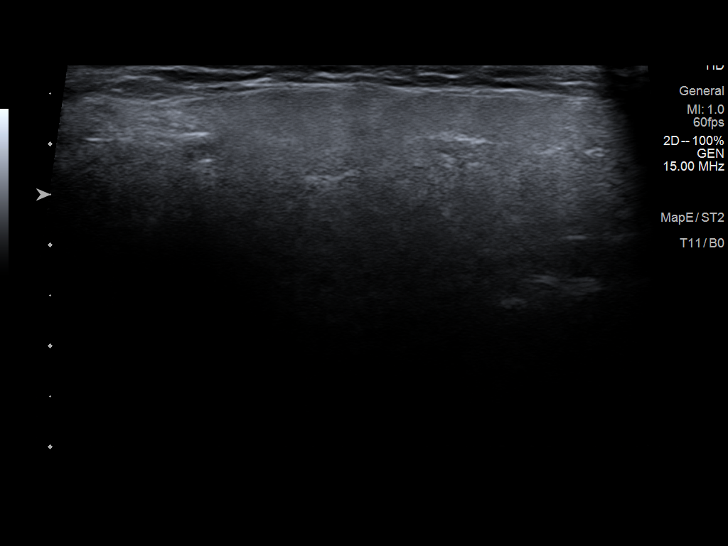
[im 2/13]
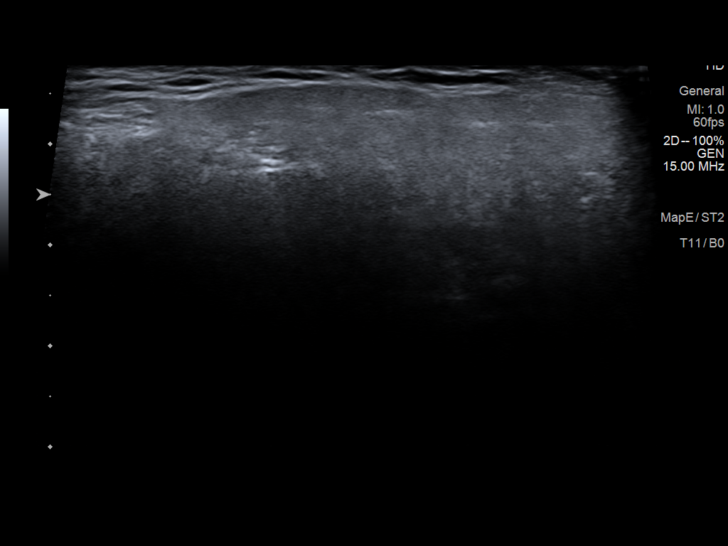
[im 3/13]
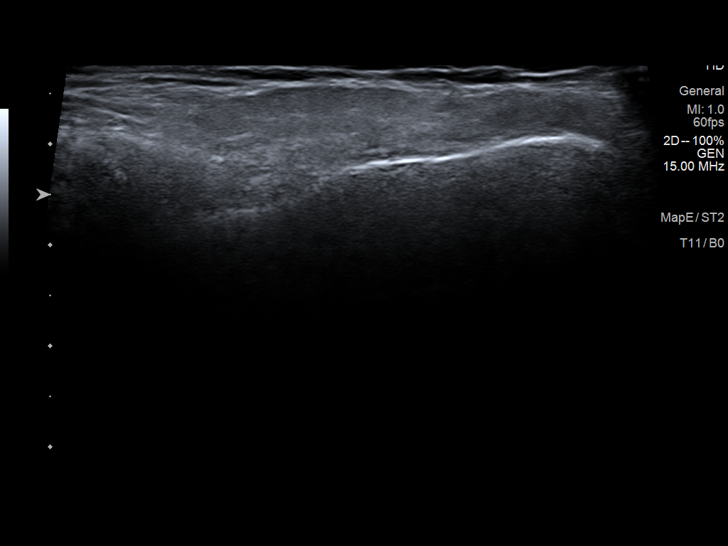
[im 4/13]
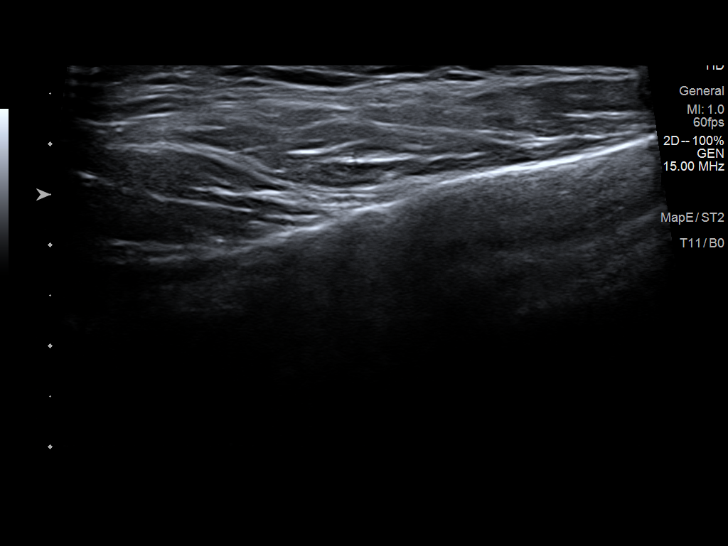
[im 5/13]
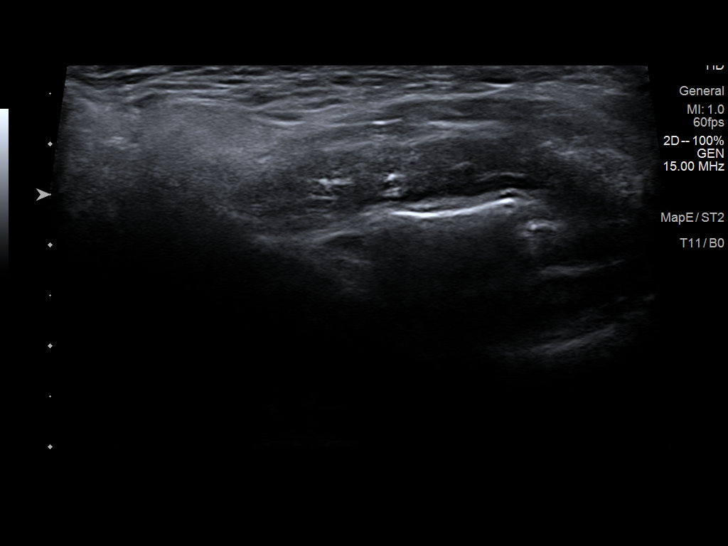
[im 6/13]
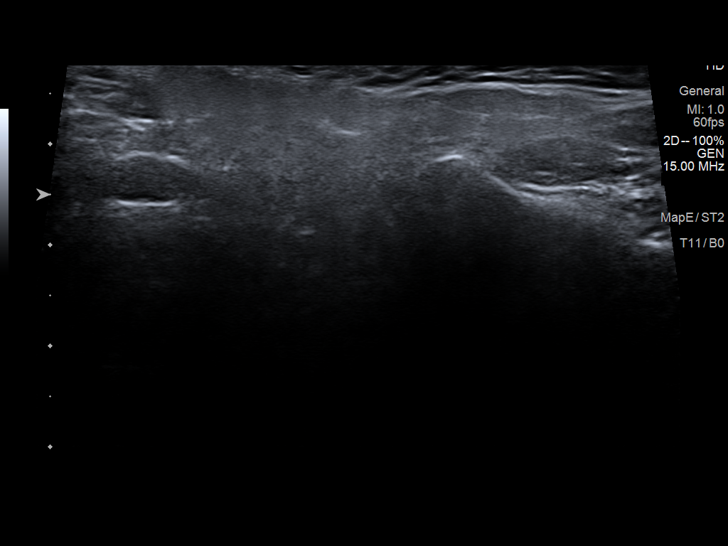
[im 7/13]
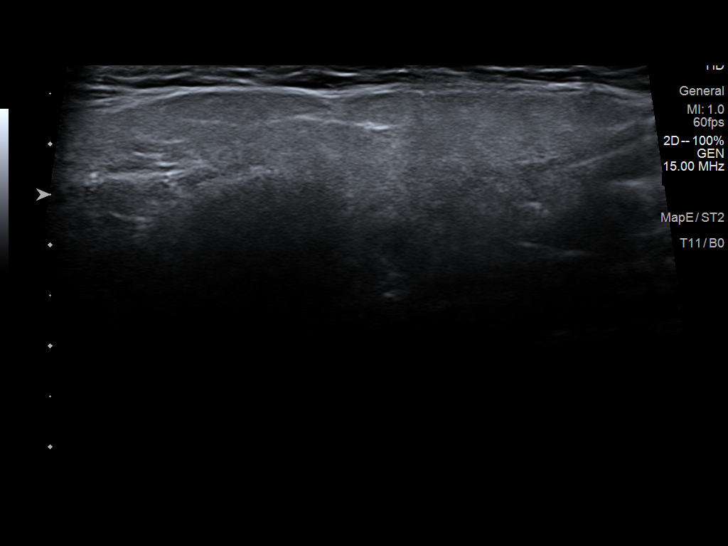
[im 8/13]
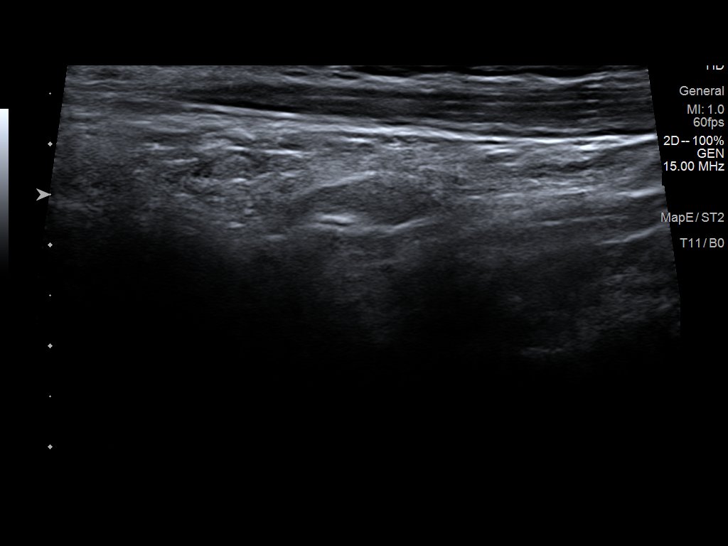
[im 9/13]
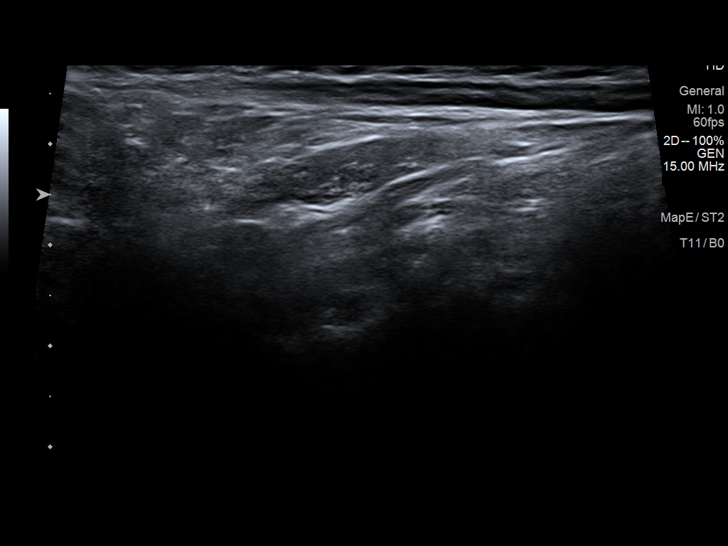
[im 10/13]
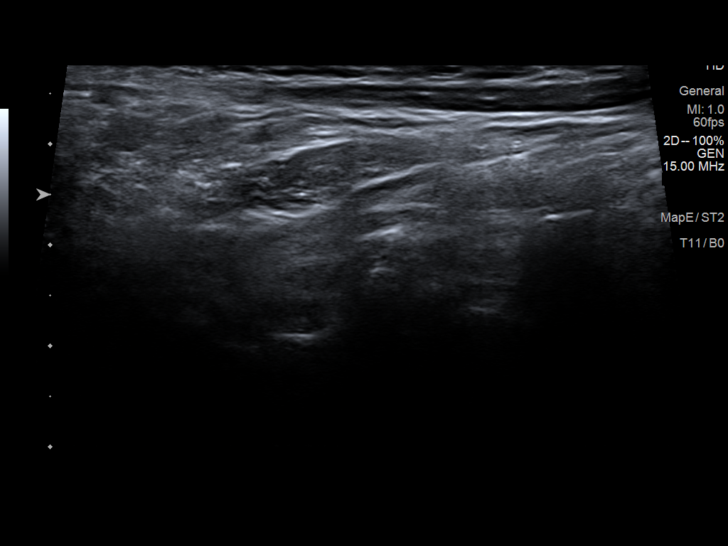
[im 11/13]
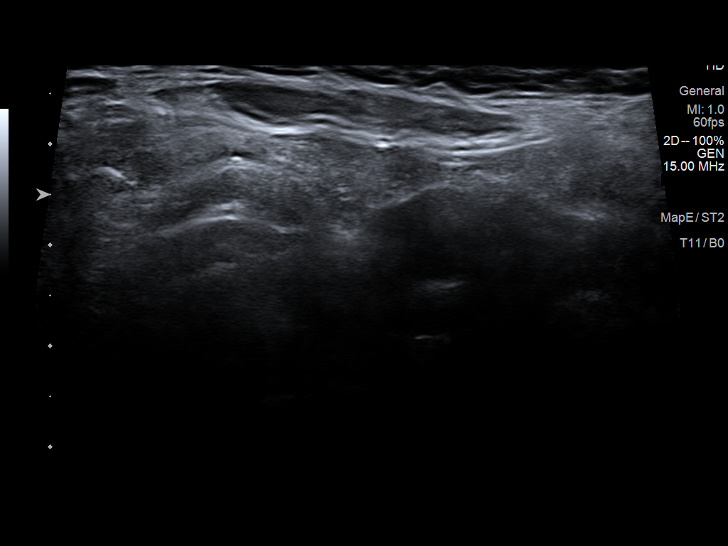
[im 12/13]
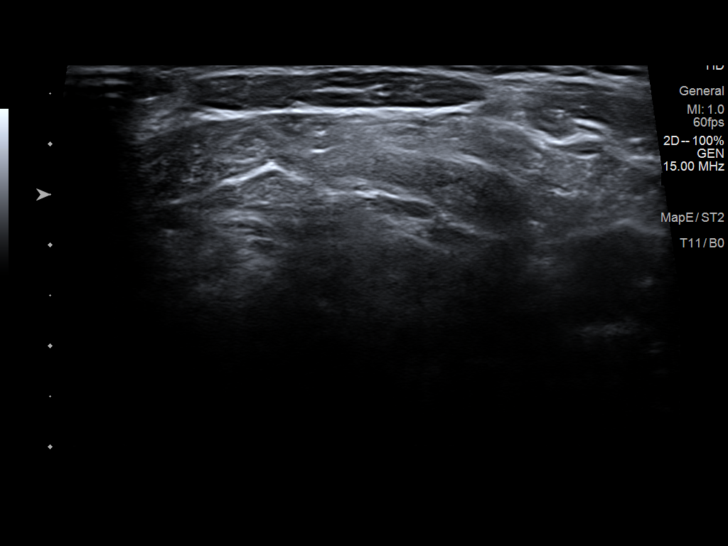
[im 13/13]
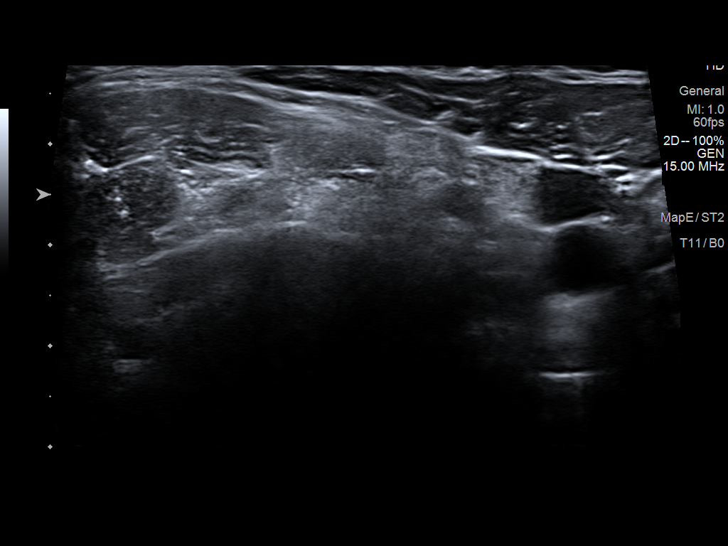

[13 of 13 positions shown; findings below may reference images not displayed]

FINDINGS: Targeted ultrasound at the area of concern and pain was performed at
the right pre auricular region. Ultrasound demonstrates no soft
tissue mass, collection, or adenopathy. Underlying soft tissues are
normal in appearance without swelling or appreciable inflammatory
changes. No findings to correlate with patient's symptoms
identified.
IMPRESSION: Negative ultrasound. No adenopathy or other abnormality seen to
correlate with patient's symptoms identified.

## 2021-01-22 ENCOUNTER — Telehealth: Payer: Self-pay | Admitting: Internal Medicine

## 2021-01-22 NOTE — Telephone Encounter (Signed)
Pt called in stating that she had spoke with her pharmacy about medication (albuterol (PROAIR HFA) 108 (90 Base) MCG/ACT inhaler). Pt stated that the pharmacist advise her (albuterol (PROAIR HFA) 108 (90 Base) MCG/ACT inhaler) is no longer covered by her insurance. Pt stated that the pharmacy advise her to ask her doctor to change th medication to Ventolin HFA. Pt requesting callback

## 2021-01-22 NOTE — Telephone Encounter (Signed)
Please advise, okay to change medication?

## 2021-01-23 ENCOUNTER — Other Ambulatory Visit: Payer: Self-pay | Admitting: Internal Medicine

## 2021-01-23 DIAGNOSIS — J4520 Mild intermittent asthma, uncomplicated: Secondary | ICD-10-CM

## 2021-01-23 MED ORDER — ALBUTEROL SULFATE HFA 108 (90 BASE) MCG/ACT IN AERS: 1.0000 | INHALATION_SPRAY | Freq: Four times a day (QID) | RESPIRATORY_TRACT | 11 refills | Status: DC | PRN

## 2021-01-23 NOTE — Telephone Encounter (Signed)
Patient informed and verbalized understanding

## 2021-01-23 NOTE — Telephone Encounter (Signed)
Sent ventolin

## 2021-02-08 ENCOUNTER — Telehealth: Payer: Self-pay | Admitting: Internal Medicine

## 2021-02-08 NOTE — Telephone Encounter (Signed)
Spoke with Dr French Ana McLean-Scocuzza and she states that the Patient does not need to be seen. States the dentist notes are needed for her to review then referral can be placed.   Called and informed the Patient that I can not call the dentist office on her behalf to verbally request the notes. Needing Patient to come in to our office to sign a release or she can pick up the note and drop off in our office. Patient states she will have the dentist office print out the note and then bring to our office for Dr French Ana McLean-Scocuzza to review.   Will await note drop off by Patient

## 2021-02-08 NOTE — Telephone Encounter (Signed)
Patient called in went to dentist name Dr Kassie Mends DDS [phone] and he advised that patient should request a referral to [facility] Th[facility]  because has Oral  lesion planitis  Please contact patient [phone], for appointments patient can not do 12/15.12/16 and 12/19

## 2021-02-11 ENCOUNTER — Encounter: Payer: Self-pay | Admitting: Internal Medicine

## 2021-02-11 DIAGNOSIS — L438 Other lichen planus: Secondary | ICD-10-CM | POA: Insufficient documentation

## 2021-02-11 NOTE — Addendum Note (Signed)
Addended by: Quentin Ore on: 02/11/2021 10:41 AM   Modules accepted: Orders

## 2021-02-11 NOTE — Addendum Note (Signed)
Addended by: Quentin Ore on: 02/11/2021 10:38 AM   Modules accepted: Orders

## 2021-02-11 NOTE — Telephone Encounter (Signed)
Patient came in to drop off paperwork in sealed envelope. Upon opening this it was a letter and not the office notes.   Letter states that Patient needs to be referred to ENT for Lichen Planus.   Is this enough or are we still needing the office note?   Letter placed on your desk

## 2021-02-11 NOTE — Addendum Note (Signed)
Addended by: Quentin Ore on: 02/11/2021 10:39 AM   Modules accepted: Orders

## 2021-02-11 NOTE — Telephone Encounter (Signed)
She is already established with Dr. Elenore Rota ENT in [facility] but referral placed and  Referred to Dr. Moshe Cipro dermatology in [place] both manage this condition   She can also discuss hair loss

## 2021-02-13 DIAGNOSIS — J3489 Other specified disorders of nose and nasal sinuses: Secondary | ICD-10-CM | POA: Diagnosis not present

## 2021-02-13 DIAGNOSIS — L433 Subacute (active) lichen planus: Secondary | ICD-10-CM | POA: Diagnosis not present

## 2021-02-13 DIAGNOSIS — R682 Dry mouth, unspecified: Secondary | ICD-10-CM | POA: Diagnosis not present

## 2021-02-13 NOTE — Telephone Encounter (Signed)
Patient informed and verbalized understanding. Copy of letter sent to scan. Patient states she does not want the original copy back and okay to shred this. Letter placed in the locked shred container.

## 2021-02-26 ENCOUNTER — Telehealth: Payer: Self-pay | Admitting: Internal Medicine

## 2021-02-26 NOTE — Telephone Encounter (Signed)
Rejection Reason - Patient did not respond" Amy Grant said on Feb 18, 2021 2:18 PM  Msg from [facility]

## 2021-03-06 ENCOUNTER — Other Ambulatory Visit: Payer: Self-pay | Admitting: Internal Medicine

## 2021-03-06 DIAGNOSIS — M545 Low back pain, unspecified: Secondary | ICD-10-CM

## 2021-03-06 DIAGNOSIS — G8929 Other chronic pain: Secondary | ICD-10-CM

## 2021-03-06 NOTE — Telephone Encounter (Signed)
Last fill 02/01/21 last OV 01/2021

## 2021-07-09 ENCOUNTER — Telehealth: Payer: Self-pay | Admitting: Internal Medicine

## 2021-07-09 ENCOUNTER — Other Ambulatory Visit: Payer: Self-pay | Admitting: Internal Medicine

## 2021-07-09 DIAGNOSIS — M5416 Radiculopathy, lumbar region: Secondary | ICD-10-CM

## 2021-07-09 NOTE — Telephone Encounter (Signed)
Agree with ED but pt refusing ? ?

## 2021-07-09 NOTE — Telephone Encounter (Signed)
Patient called and has been having Chest pain off and on. It comes and goes, patient stated. Front office wanted to triage and patient refused, she stated she did not want to go to hospital. Patient wanted the next available appointment. Appointment was made for 5/11/223 with her provider. ?

## 2021-07-09 NOTE — Telephone Encounter (Signed)
Patient says she has been having chest pain and tightness , and it kind of comes in the middle right below the collar bone anything can make it hurt, on the surface feels real tight under the surface it feels really sore, no nausea noted last night she said when she swallows it kind of sets there, patient says he feels the tension in her back, she feels like it is muscle tension. Patient feels anxious and says there is a lot going on , ask patient to take her Blood pressure and get pulse I would call her right back that this has been going on now for weeks. She has pulse OX when I called back her HR and pulse 94 and 98 oxygen  the pain does not radiate to arms or jaw line, sometimes it is rated at 6 right now it is rated at 4 with rest. Advised patient with these symptoms she should be evaluated today , patient refused ED stating she will wait until appointment on Thursday and see PCP. Advised symptoms worsen to call 911 , patient did agree to call 911 if symptoms worsen. ?

## 2021-07-11 ENCOUNTER — Ambulatory Visit (INDEPENDENT_AMBULATORY_CARE_PROVIDER_SITE_OTHER): Payer: Medicare Other | Admitting: Internal Medicine

## 2021-07-11 ENCOUNTER — Encounter: Payer: Self-pay | Admitting: Internal Medicine

## 2021-07-11 ENCOUNTER — Ambulatory Visit (INDEPENDENT_AMBULATORY_CARE_PROVIDER_SITE_OTHER): Payer: Medicare Other

## 2021-07-11 VITALS — BP 106/68 | HR 90 | Temp 98.4°F | Resp 14 | Ht 65.0 in | Wt 140.8 lb

## 2021-07-11 DIAGNOSIS — R0789 Other chest pain: Secondary | ICD-10-CM

## 2021-07-11 DIAGNOSIS — M5441 Lumbago with sciatica, right side: Secondary | ICD-10-CM

## 2021-07-11 DIAGNOSIS — Z9889 Other specified postprocedural states: Secondary | ICD-10-CM | POA: Diagnosis not present

## 2021-07-11 DIAGNOSIS — J4520 Mild intermittent asthma, uncomplicated: Secondary | ICD-10-CM | POA: Diagnosis not present

## 2021-07-11 DIAGNOSIS — G8928 Other chronic postprocedural pain: Secondary | ICD-10-CM | POA: Diagnosis not present

## 2021-07-11 DIAGNOSIS — M5412 Radiculopathy, cervical region: Secondary | ICD-10-CM

## 2021-07-11 DIAGNOSIS — M542 Cervicalgia: Secondary | ICD-10-CM

## 2021-07-11 DIAGNOSIS — M545 Low back pain, unspecified: Secondary | ICD-10-CM

## 2021-07-11 DIAGNOSIS — I10 Essential (primary) hypertension: Secondary | ICD-10-CM

## 2021-07-11 DIAGNOSIS — M1990 Unspecified osteoarthritis, unspecified site: Secondary | ICD-10-CM

## 2021-07-11 DIAGNOSIS — M5410 Radiculopathy, site unspecified: Secondary | ICD-10-CM

## 2021-07-11 DIAGNOSIS — G8929 Other chronic pain: Secondary | ICD-10-CM | POA: Diagnosis not present

## 2021-07-11 DIAGNOSIS — R079 Chest pain, unspecified: Secondary | ICD-10-CM | POA: Diagnosis not present

## 2021-07-11 DIAGNOSIS — M5416 Radiculopathy, lumbar region: Secondary | ICD-10-CM

## 2021-07-11 LAB — COMPREHENSIVE METABOLIC PANEL
ALT: 17 U/L (ref 0–35)
AST: 21 U/L (ref 0–37)
Albumin: 4.3 g/dL (ref 3.5–5.2)
Alkaline Phosphatase: 71 U/L (ref 39–117)
BUN: 18 mg/dL (ref 6–23)
CO2: 30 mEq/L (ref 19–32)
Calcium: 8.7 mg/dL (ref 8.4–10.5)
Chloride: 105 mEq/L (ref 96–112)
Creatinine, Ser: 1.19 mg/dL (ref 0.40–1.20)
GFR: 46.84 mL/min — ABNORMAL LOW (ref >60.00)
Glucose, Bld: 89 mg/dL (ref 70–99)
Potassium: 4.2 mEq/L (ref 3.5–5.1)
Sodium: 140 mEq/L (ref 135–145)
Total Bilirubin: 0.7 mg/dL (ref 0.2–1.2)
Total Protein: 6.4 g/dL (ref 6.0–8.3)

## 2021-07-11 LAB — CBC WITH DIFFERENTIAL/PLATELET
Basophils Absolute: 0 10*3/uL (ref 0.0–0.1)
Basophils Relative: 0.9 % (ref 0.0–3.0)
Eosinophils Absolute: 0.3 10*3/uL (ref 0.0–0.7)
Eosinophils Relative: 5.5 % — ABNORMAL HIGH (ref 0.0–5.0)
HCT: 35.7 % — ABNORMAL LOW (ref 36.0–46.0)
Hemoglobin: 12.2 g/dL (ref 12.0–15.0)
Lymphocytes Relative: 33.2 % (ref 12.0–46.0)
Lymphs Abs: 1.6 10*3/uL (ref 0.7–4.0)
MCHC: 34.2 g/dL (ref 30.0–36.0)
MCV: 91.5 fl (ref 78.0–100.0)
Monocytes Absolute: 0.4 10*3/uL (ref 0.1–1.0)
Monocytes Relative: 9 % (ref 3.0–12.0)
Neutro Abs: 2.4 10*3/uL (ref 1.4–7.7)
Neutrophils Relative %: 51.4 % (ref 43.0–77.0)
Platelets: 169 10*3/uL (ref 150.0–400.0)
RBC: 3.9 Mil/uL (ref 3.87–5.11)
RDW: 13.3 % (ref 11.5–15.5)
WBC: 4.7 10*3/uL (ref 4.0–10.5)

## 2021-07-11 LAB — LIPID PANEL
Cholesterol: 180 mg/dL (ref 0–200)
HDL: 70.1 mg/dL (ref >39.00)
LDL Cholesterol: 96 mg/dL (ref 0–99)
NonHDL: 109.93
Total CHOL/HDL Ratio: 3
Triglycerides: 69 mg/dL (ref 0.0–149.0)
VLDL: 13.8 mg/dL (ref 0.0–40.0)

## 2021-07-11 LAB — TROPONIN I (HIGH SENSITIVITY): High Sens Troponin I: 3 ng/L (ref 2–17)

## 2021-07-11 LAB — D-DIMER, QUANTITATIVE: D-Dimer, Quant: 0.21 mcg/mL FEU (ref <0.50)

## 2021-07-11 MED ORDER — HYDROCODONE-ACETAMINOPHEN 5-325 MG PO TABS: 1.0000 | ORAL_TABLET | Freq: Two times a day (BID) | ORAL | 0 refills | Status: DC | PRN

## 2021-07-11 MED ORDER — MELOXICAM 7.5 MG PO TABS: ORAL_TABLET | ORAL | 3 refills | Status: DC

## 2021-07-11 MED ORDER — ALBUTEROL SULFATE HFA 108 (90 BASE) MCG/ACT IN AERS: 1.0000 | INHALATION_SPRAY | Freq: Four times a day (QID) | RESPIRATORY_TRACT | 11 refills | Status: DC | PRN

## 2021-07-11 MED ORDER — GABAPENTIN 300 MG PO CAPS: ORAL_CAPSULE | ORAL | 3 refills | Status: DC

## 2021-07-11 MED ORDER — CYCLOBENZAPRINE HCL 10 MG PO TABS: ORAL_TABLET | ORAL | 11 refills | Status: DC

## 2021-07-11 MED ORDER — VERAPAMIL HCL ER 240 MG PO TBCR: 240.0000 mg | EXTENDED_RELEASE_TABLET | Freq: Every day | ORAL | 3 refills | Status: DC

## 2021-07-11 NOTE — Progress Notes (Signed)
Chief Complaint  ?Patient presents with  ? Chest Pain  ?  Pt c/o chest tightness ongoing for awhile, tightness comes and goes, popping noise in chest c/o chest pain, radiating to upper back chest tender to touch. Denies any injury trauma.  ? ?F/u  ?1. Atypical chest pain rad to back comes and goes worse with doing house cleaning chronic neck pain rad to upper back and tender to touch no trauma or injury h/o neck and low back pain pain 8/10 at times on gabapentin mobic, flexeril and not helping  ?[facility]  ? ?Review of Systems  ?Constitutional:  Negative for weight loss.  ?HENT:  Negative for hearing loss.   ?Eyes:  Negative for blurred vision.  ?Respiratory:  Negative for shortness of breath.   ?Cardiovascular:  Positive for chest pain.  ?Gastrointestinal:  Negative for abdominal pain and blood in stool.  ?Genitourinary:  Negative for dysuria.  ?Musculoskeletal:  Positive for back pain and neck pain. Negative for falls and joint pain.  ?Skin:  Negative for rash.  ?Neurological:  Negative for headaches.  ?Psychiatric/Behavioral:  Negative for depression.   ?Past Medical History:  ?Diagnosis Date  ? Anxiety   ? Arthritis   ? right hand, DDD L 4 , L 5  ? Asthma   ? Blood in stool   ? Chronic intermittent post-traumatic headache   ? Colon polyps   ? Complication of anesthesia 2018  ? difficulty waking up   ? Depression   ? Family history of adverse reaction to anesthesia   ? Sister woke up during surgery  ? GERD (gastroesophageal reflux disease)   ? Head injury   ? Headache   ? h/o migraines   ? Hepatitis   ? Hepatitis C   ? treated with mavyret per GI  ? History of blood transfusion   ? Hypertension   ? Laryngopharyngeal reflux (LPR)   ? Multiple falls   ? Oral lichen planus   ? Pancreatitis   ? remote hx w/o h/o drinking at that time  ? Seizures (HCC)   ?  after head injury  ? Vitamin D deficiency   ? ?Past Surgical History:  ?Procedure Laterality Date  ? ABDOMINAL HYSTERECTOMY  1979  ? has an ovary left s/p  hysterectomy 1979; h/o abnormal pap   ? acdf    ? 08/07/16 Dr. Yevette Edwards   ? ANTERIOR CERVICAL DECOMP/DISCECTOMY FUSION N/A 08/07/2016  ? Procedure: ANTERIOR CERVICAL DECOMPRESSION FUSION, CERVICAL FOUR-FIVE  WITH INSTRUMENTATION AND ALLOGRAFT;  Surgeon: Estill Bamberg, MD;  Location: [facility] OR;  Service: Orthopedics;  Laterality: N/A;  ? APPENDECTOMY    ? BREAST BIOPSY Left   ? over 20 years ago negative left breast as of 03/2020   ? CESAREAN SECTION    ? COLON RESECTION    ? paritial ? reason done in [place]   ? COLONOSCOPY    ? EXPLORATORY LAPAROTOMY    ? after C Section  x 2 - 1 for bleeding and 1 for infection  ? HERNIA REPAIR    ? Inguinal - as a child  ? low back surgery    ? Dr. Yevette Edwards   ? TONSILLECTOMY    ? TRANSFORAMINAL LUMBAR INTERBODY FUSION (TLIF) WITH PEDICLE SCREW FIXATION 1 LEVEL Right 03/24/2018  ? Procedure: RIGHT LUMBAR 4 - LUMBAR 5 TRANSFORAMINAL LUMBAR INTERBODY FUSION WITH INSTRUMENTATION AND ALLOGRAFT;  Surgeon: Estill Bamberg, MD;  Location: [facility] OR;  Service: Orthopedics;  Laterality: Right;  ? ?Family History  ?  Problem Relation Age of Onset  ? Breast cancer Sister 35  ? Cancer Sister   ?     breast   ? Diabetes Sister   ? Miscarriages / Stillbirths Sister   ? Cancer Mother   ?     breast/precancer changes  ? Heart disease Mother   ? Hypertension Mother   ? Miscarriages / [place] Mother   ? Heart failure Mother   ? Multiple sclerosis Father   ? Miscarriages / [place] Daughter   ? Arthritis Maternal Grandmother   ? Hyperlipidemia Maternal Grandmother   ? Learning disabilities Maternal Grandmother   ? Cancer Maternal Grandfather   ? Diabetes Paternal Grandmother   ? Arthritis Sister   ? Miscarriages / Stillbirths Sister   ? Other Sister   ?     precancerous changes breast  ? ?Social History  ? ?Socioeconomic History  ? Marital status: Divorced  ?  Spouse name: Not on file  ? Number of children: Not on file  ? Years of education: Not on file  ? Highest education level: Not on file  ?Occupational  History  ? Not on file  ?Tobacco Use  ? Smoking status: Former  ?  Years: 30.00  ?  Types: Cigarettes  ?  Quit date: 2013  ?  Years since quitting: 10.3  ? Smokeless tobacco: Never  ?Vaping Use  ? Vaping Use: Never used  ?Substance and Sexual Activity  ? Alcohol use: Yes  ?  Alcohol/week: 7.0 standard drinks  ?  Types: 7 Glasses of wine per week  ?  Comment: per week  ? Drug use: No  ? Sexual activity: Not on file  ?Other Topics Concern  ? Not on file  ?Social History Narrative  ? ** Merged History Encounter **  ?    ? Associate degree  ?Wears seat belt, feels safe in marriage to Ron ?1 daughter   ? ?Social Determinants of Health  ? ?Financial Resource Strain: Not on file  ?Food Insecurity: No Food Insecurity  ? Worried About Programme researcher, broadcasting/film/video in the Last Year: Never true  ? Ran Out of Food in the Last Year: Never true  ?Transportation Needs: No Transportation Needs  ? Lack of Transportation (Medical): No  ? Lack of Transportation (Non-Medical): No  ?Physical Activity: Insufficiently Active  ? Days of Exercise per Week: 4 days  ? Minutes of Exercise per Session: 30 min  ?Stress: No Stress Concern Present  ? Feeling of Stress : Only a little  ?Social Connections: Unknown  ? Frequency of Communication with Friends and Family: More than three times a week  ? Frequency of Social Gatherings with Friends and Family: More than three times a week  ? Attends Religious Services: Not on file  ? Active Member of Clubs or Organizations: Not on file  ? Attends Banker Meetings: Not on file  ? Marital Status: Not on file  ?Intimate Partner Violence: Not At Risk  ? Fear of Current or Ex-Partner: No  ? Emotionally Abused: No  ? Physically Abused: No  ? Sexually Abused: No  ? ?Current Meds  ?Medication Sig  ? Calcium Carbonate-Vitamin D (CALTRATE 600+D PO) Take 1 tablet by mouth 2 (two) times daily.   ? HYDROcodone-acetaminophen (NORCO) 5-325 MG tablet Take 1 tablet by mouth 2 (two) times daily as needed for moderate  pain.  ? Multiple Vitamin (MULTIVITAMIN WITH MINERALS) TABS tablet Take 1 tablet by mouth 2 (two) times daily.   ?  PREVIDENT 5000 SENSITIVE 1.1-5 % PSTE Place onto teeth as directed.  ? [DISCONTINUED] albuterol (VENTOLIN HFA) 108 (90 Base) MCG/ACT inhaler Inhale 1-2 puffs into the lungs every 6 (six) hours as needed for wheezing or shortness of breath.  ? [DISCONTINUED] cyclobenzaprine (FLEXERIL) 10 MG tablet TAKE 1 TABLET(10 MG) BY MOUTH TWICE DAILY AS NEEDED FOR MUSCLE SPASMS  ? [DISCONTINUED] gabapentin (NEURONTIN) 300 MG capsule TAKE 2 CAPSULES(600 MG) BY MOUTH TWICE DAILY  ? [DISCONTINUED] meloxicam (MOBIC) 7.5 MG tablet TAKE 1 TABLET(7.5 MG) BY MOUTH TWICE DAILY AS NEEDED FOR PAIN  ? [DISCONTINUED] verapamil (CALAN-SR) 240 MG CR tablet Take 1 tablet (240 mg total) by mouth at bedtime.  ? ?Allergies  ?Allergen Reactions  ? Latex Hives  ? Sulfa Antibiotics Hives  ? Linaclotide Itching  ? ?Recent Results (from the past 2160 hour(s))  ?D-Dimer, Quantitative     Status: None  ? Collection Time: 07/11/21  9:51 AM  ?Result Value Ref Range  ? D-Dimer, Quant 0.21 <0.50 mcg/mL FEU  ?  Comment: . ?The D-Dimer test is used frequently to exclude ?an acute PE or DVT. In patients with a low to ?moderate clinical risk assessment and a D-Dimer ?result <0.50 mcg/mL FEU, the likelihood of a PE ?or DVT is very low. However, a thromboembolic ?event should not be excluded solely on the basis ?of the D-Dimer level. Increased levels of D-Dimer ?are associated with a PE, DVT, DIC, malignancies, ?inflammation, sepsis, surgery, trauma, pregnancy, ?and advancing patient age. ?[Jama 2006 11:295(2):199-207] ?Marland Kitchen. ?For additional information, please refer to: ?http://education.questdiagnostics.com/faq/FAQ149 ?(This link is being provided for informational/ ?educational purposes only) ?. ?  ? ?Objective  ?Body mass index is 23.43 kg/m?. ?Wt Readings from Last 3 Encounters:  ?07/11/21 140 lb 12.8 oz (63.9 kg)  ?07/27/20 143 lb (64.9 kg)   ?07/06/20 143 lb (64.9 kg)  ? ?Temp Readings from Last 3 Encounters:  ?07/11/21 98.4 ?F (36.9 ?C) (Oral)  ?07/06/20 98.3 ?F (36.8 ?C) (Oral)  ?03/27/20 97.9 ?F (36.6 ?C) (Oral)  ? ?BP Readings from Last 3 E

## 2021-07-11 NOTE — Patient Instructions (Signed)
Think about physical therapy  ? ?

## 2021-07-15 ENCOUNTER — Telehealth: Payer: Self-pay

## 2021-07-15 ENCOUNTER — Other Ambulatory Visit: Payer: Self-pay

## 2021-07-15 DIAGNOSIS — R0789 Other chest pain: Secondary | ICD-10-CM

## 2021-07-15 NOTE — Progress Notes (Signed)
Bmet w/gfr ordered for repeat in 2 wks.  ?

## 2021-07-15 NOTE — Telephone Encounter (Signed)
Lvm for pt to return call in regards to labs.  ?

## 2021-07-16 NOTE — Telephone Encounter (Signed)
error 

## 2021-07-23 ENCOUNTER — Other Ambulatory Visit: Payer: Self-pay | Admitting: Internal Medicine

## 2021-07-23 DIAGNOSIS — I10 Essential (primary) hypertension: Secondary | ICD-10-CM

## 2021-07-25 ENCOUNTER — Telehealth: Payer: Self-pay | Admitting: Internal Medicine

## 2021-07-25 NOTE — Telephone Encounter (Signed)
Copied from CRM [id number]. Topic: Medicare AWV >> Jul 25, 2021 10:14 AM Harris-Coley, Avon Gully wrote: Reason for CRM: Left message for patient to schedule Annual Wellness Visit.  Please schedule with Nurse Health Advisor Denisa O'Brien-Blaney, LPN at [facility].  Please call [phone] ask for [facility]

## 2021-08-01 ENCOUNTER — Telehealth: Payer: Self-pay | Admitting: Internal Medicine

## 2021-08-01 ENCOUNTER — Other Ambulatory Visit: Payer: Self-pay | Admitting: Internal Medicine

## 2021-08-01 DIAGNOSIS — M545 Low back pain, unspecified: Secondary | ICD-10-CM

## 2021-08-01 NOTE — Telephone Encounter (Signed)
Received refill for mobic but pt has refills sent 07/11/21 inform  I prefer she not take this medication and just use tylenol as needed for pain (as her kidney function was elevated on last labs (I.e increase in creatinine and reduced GFR from 55 to 46)   Repeat lab schedule bmet if not done  Also rec water 55-64 ounces daily

## 2021-08-02 ENCOUNTER — Other Ambulatory Visit (INDEPENDENT_AMBULATORY_CARE_PROVIDER_SITE_OTHER): Payer: Medicare Other

## 2021-08-02 DIAGNOSIS — R0789 Other chest pain: Secondary | ICD-10-CM

## 2021-08-02 NOTE — Telephone Encounter (Signed)
Lvm for pt to return call in regards to her mobic. Pt has refills since med was sent in on 07/11/21, pls read not below.

## 2021-08-03 LAB — BASIC METABOLIC PANEL WITH GFR
BUN/Creatinine Ratio: 12 (calc) (ref 6–22)
BUN: 13 mg/dL (ref 7–25)
CO2: 25 mmol/L (ref 20–32)
Calcium: 9 mg/dL (ref 8.6–10.4)
Chloride: 106 mmol/L (ref 98–110)
Creat: 1.11 mg/dL — ABNORMAL HIGH (ref 0.50–1.05)
Glucose, Bld: 107 mg/dL — ABNORMAL HIGH (ref 65–99)
Potassium: 3.8 mmol/L (ref 3.5–5.3)
Sodium: 141 mmol/L (ref 135–146)
eGFR: 54 mL/min/{1.73_m2} — ABNORMAL LOW (ref >=60)

## 2021-08-05 ENCOUNTER — Telehealth: Payer: Self-pay | Admitting: Internal Medicine

## 2021-08-05 NOTE — Telephone Encounter (Signed)
Please let me know if wants referral to pain clinic for chronic pain management for neck and low back pain instead of mobic? I do not recommend she take this on a regular basis and may need chronic pain management for her pain?   Let me know please ?

## 2021-08-05 NOTE — Telephone Encounter (Signed)
Patient would like a call back from office. She has some questions about recent labs.

## 2021-08-06 ENCOUNTER — Ambulatory Visit (INDEPENDENT_AMBULATORY_CARE_PROVIDER_SITE_OTHER): Payer: Medicare Other

## 2021-08-06 ENCOUNTER — Other Ambulatory Visit: Payer: Self-pay | Admitting: Internal Medicine

## 2021-08-06 VITALS — Ht 65.0 in | Wt 140.0 lb

## 2021-08-06 DIAGNOSIS — M5416 Radiculopathy, lumbar region: Secondary | ICD-10-CM

## 2021-08-06 DIAGNOSIS — Z0000 Encounter for general adult medical examination without abnormal findings: Secondary | ICD-10-CM | POA: Diagnosis not present

## 2021-08-06 NOTE — Patient Instructions (Addendum)
  Amy Grant , Thank you for taking time to come for your Medicare Wellness Visit. I appreciate your ongoing commitment to your health goals. Please review the following plan we discussed and let me know if I can assist you in the future.   These are the goals we discussed:  Goals      Follow up with PCP as needed     Healthy diet. Stay active.        This is a list of the screening recommended for you and due dates:  Health Maintenance  Topic Date Due   COVID-19 Vaccine (4 - Booster for Pfizer series) 08/22/2021*   Colon Cancer Screening  10/21/2021*   Flu Shot  10/01/2021   Mammogram  09/29/2022   DEXA scan (bone density measurement)  Completed   Hepatitis C Screening: USPSTF Recommendation to screen - Ages 82-79 yo.  Completed   HPV Vaccine  Aged Out   Pneumonia Vaccine  Discontinued   Tetanus Vaccine  Discontinued   Zoster (Shingles) Vaccine  Discontinued  *Topic was postponed. The date shown is not the original due date.

## 2021-08-06 NOTE — Telephone Encounter (Signed)
Lvm for pt to return call to disc if interested in referral to pain clinic for pain management on neck & low back pain?

## 2021-08-06 NOTE — Progress Notes (Signed)
Subjective:   MISTIE ADNEY is a 71 y.o. female who presents for Medicare Annual (Subsequent) preventive examination.  Review of Systems    No ROS.  Medicare Wellness Virtual Visit.  Visual/audio telehealth visit, UTA vital signs.   See social history for additional risk factors.         Objective:    Today's Vitals   08/06/21 1403  Weight: 140 lb (63.5 kg)  Height:  (1.651 m)   Body mass index is 23.3 kg/m.     08/06/2021    2:11 PM 07/27/2020   12:52 PM 07/27/2019    1:43 PM 03/25/2018    8:00 AM 03/18/2018    1:24 PM 03/18/2018    1:23 PM 12/24/2017    2:10 PM  Advanced Directives  Does Patient Have a Medical Advance Directive? Yes No No Yes  Yes No  Type of Estate agent of [place];Living will   Living will;Healthcare Power of Attorney Living will;Healthcare Power of Attorney    Does patient want to make changes to medical advance directive? No - Patient declined   No - Patient declined No - Patient declined    Copy of Healthcare Power of Attorney in Chart? No - copy requested   No - copy requested No - copy requested    Would patient like information on creating a medical advance directive?  No - Patient declined Yes (MAU/Ambulatory/Procedural Areas - Information given)    Yes (MAU/Ambulatory/Procedural Areas - Information given)    Current Medications (verified) Outpatient Encounter Medications as of 08/06/2021  Medication Sig   albuterol (VENTOLIN HFA) 108 (90 Base) MCG/ACT inhaler Inhale 1-2 puffs into the lungs every 6 (six) hours as needed for wheezing or shortness of breath.   Calcium Carbonate-Vitamin D (CALTRATE 600+D PO) Take 1 tablet by mouth 2 (two) times daily.    cyclobenzaprine (FLEXERIL) 10 MG tablet Bid prn   eszopiclone (LUNESTA) 2 MG TABS tablet Take 0.5-1 tablets (1-2 mg total) by mouth at bedtime as needed for sleep. Take immediately before bedtime   gabapentin (NEURONTIN) 300 MG capsule TAKE 2 CAPSULES(600 MG) BY MOUTH  TWICE DAILY   HYDROcodone-acetaminophen (NORCO) 5-325 MG tablet Take 1 tablet by mouth 2 (two) times daily as needed for moderate pain.   meloxicam (MOBIC) 7.5 MG tablet TAKE 1 TABLET(7.5 MG) BY MOUTH TWICE DAILY AS NEEDED FOR PAIN   Multiple Vitamin (MULTIVITAMIN WITH MINERALS) TABS tablet Take 1 tablet by mouth 2 (two) times daily.    PREVIDENT 5000 SENSITIVE 1.1-5 % PSTE Place onto teeth as directed.   verapamil (CALAN-SR) 240 MG CR tablet TAKE 1 TABLET(240 MG) BY MOUTH AT BEDTIME   No facility-administered encounter medications on file as of 08/06/2021.    Allergies (verified) Latex, Sulfa antibiotics, and Linaclotide   History: Past Medical History:  Diagnosis Date   Anxiety    Arthritis    right hand, DDD L 4 , L 5   Asthma    Blood in stool    Chronic intermittent post-traumatic headache    Colon polyps    Complication of anesthesia 2018   difficulty waking up    Depression    Family history of adverse reaction to anesthesia    Sister woke up during surgery   GERD (gastroesophageal reflux disease)    Head injury    Headache    h/o migraines    Hepatitis    Hepatitis C    treated with mavyret per GI  History of blood transfusion    Hypertension    Laryngopharyngeal reflux (LPR)    Multiple falls    Oral lichen planus    Pancreatitis    remote hx w/o h/o drinking at that time   Seizures [facility])     after head injury   Vitamin D deficiency    Past Surgical History:  Procedure Laterality Date   ABDOMINAL HYSTERECTOMY  1979   has an ovary left s/p hysterectomy 1979; h/o abnormal pap    acdf     08/07/16 Dr. Yevette Edwards    ANTERIOR CERVICAL DECOMP/DISCECTOMY FUSION N/A 08/07/2016   Procedure: ANTERIOR CERVICAL DECOMPRESSION FUSION, CERVICAL FOUR-FIVE  WITH INSTRUMENTATION AND ALLOGRAFT;  Surgeon: Estill Bamberg, MD;  Location: [facility] OR;  Service: Orthopedics;  Laterality: N/A;   APPENDECTOMY     BREAST BIOPSY Left    over 20 years ago negative left breast as of 03/2020     CESAREAN SECTION     COLON RESECTION     paritial ? reason done in [place]    COLONOSCOPY     EXPLORATORY LAPAROTOMY     after C Section  x 2 - 1 for bleeding and 1 for infection   HERNIA REPAIR     Inguinal - as a child   low back surgery     Dr. Yevette Edwards    TONSILLECTOMY     TRANSFORAMINAL LUMBAR INTERBODY FUSION (TLIF) WITH PEDICLE SCREW FIXATION 1 LEVEL Right 03/24/2018   Procedure: RIGHT LUMBAR 4 - LUMBAR 5 TRANSFORAMINAL LUMBAR INTERBODY FUSION WITH INSTRUMENTATION AND ALLOGRAFT;  Surgeon: Estill Bamberg, MD;  Location: [facility] OR;  Service: Orthopedics;  Laterality: Right;   Family History  Problem Relation Age of Onset   Breast cancer Sister 15   Cancer Sister        breast    Diabetes Sister    Miscarriages / [place] Sister    Cancer Mother        breast/precancer changes   Heart disease Mother    Hypertension Mother    Miscarriages / Stillbirths Mother    Heart failure Mother    Multiple sclerosis Father    Miscarriages / [place] Daughter    Arthritis Maternal Grandmother    Hyperlipidemia Maternal Grandmother    Learning disabilities Maternal Grandmother    Cancer Maternal Grandfather    Diabetes Paternal Grandmother    Arthritis Sister    Miscarriages / [place] Sister    Other Sister        precancerous changes breast   Social History   Socioeconomic History   Marital status: Divorced    Spouse name: Not on file   Number of children: Not on file   Years of education: Not on file   Highest education level: Not on file  Occupational History   Not on file  Tobacco Use   Smoking status: Former    Years: 30.00    Types: Cigarettes    Quit date: 2013    Years since quitting: 10.4   Smokeless tobacco: Never  Vaping Use   Vaping Use: Never used  Substance and Sexual Activity   Alcohol use: Yes    Alcohol/week: 7.0 standard drinks    Types: 7 Glasses of wine per week    Comment: per week   Drug use: No   Sexual activity: Not on file  Other  Topics Concern   Not on file  Social History Narrative   ** Merged History Encounter **  Associate degree  Wears seat belt, feels safe in marriage to Ron 1 daughter    Social Determinants of Corporate investment bankerHealth   Financial Resource Strain: Low Risk    Difficulty of Paying Living Expenses: Not hard at all  Food Insecurity: No Food Insecurity   Worried About Programme researcher, broadcasting/film/videounning Out of Food in the Last Year: Never true   Baristaan Out of Food in the Last Year: Never true  Transportation Needs: No Transportation Needs   Lack of Transportation (Medical): No   Lack of Transportation (Non-Medical): No  Physical Activity: Insufficiently Active   Days of Exercise per Week: 4 days   Minutes of Exercise per Session: 30 min  Stress: No Stress Concern Present   Feeling of Stress : Not at all  Social Connections: Unknown   Frequency of Communication with Friends and Family: More than three times a week   Frequency of Social Gatherings with Friends and Family: More than three times a week   Attends Religious Services: Not on Scientist, clinical (histocompatibility and immunogenetics)file   Active Member of Clubs or Organizations: Not on file   Attends BankerClub or Organization Meetings: Not on file   Marital Status: Not on file    Tobacco Counseling Counseling given: Not Answered   Clinical Intake:  Pre-visit preparation completed: Yes        Diabetes: No  How often do you need to have someone help you when you read instructions, pamphlets, or other written materials from your doctor or pharmacy?: 1 - Never  Interpreter Needed?: No      Activities of Daily Living     View : No data to display.          Patient Care Team: McLean-Scocuzza, Pasty Spillersracy N, MD as PCP - General (Internal Medicine)  Indicate any recent Medical Services you may have received from other than [facility] providers in the past year (date may be approximate).     Assessment:   This is a routine wellness examination for Jasmine DecemberSharon.  Virtual Visit via Telephone Note  I connected with  Arlington CalixSharon L  Chancellor on 08/06/21 at  2:00 PM EDT by telephone and verified that I am speaking with the correct person using two identifiers.  Persons participating in the virtual visit: patient/Nurse Health Advisor   I discussed the limitations of performing an evaluation and management service by telehealth. We continued and completed visit with audio only. Some vital signs may be absent or patient reported.   Hearing/Vision screen Hearing Screening - Comments:: Patient is able to hear conversational tones without difficulty. No issues reported. Vision Screening - Comments:: Follow[facility] by [facility] Wears corrective lenses They have seen their ophthalmologist in the last 12 months.   Dietary issues and exercise activities discussed:   Regular diet Good water intake   Goals Addressed             This Visit's Progress    Follow up with PCP as needed       Healthy diet. Stay active.       Depression Screen    08/06/2021    2:10 PM 07/11/2021    9:32 AM 07/27/2020   12:41 PM 12/09/2019    1:36 PM 09/29/2019    2:04 PM 08/09/2019    1:59 PM 07/27/2019    1:45 PM  PHQ 2/9 Scores  PHQ - 2 Score 0 0 0 0 0 0 0    Fall Risk    08/06/2021    2:13 PM 07/11/2021    9:32  AM 07/27/2020   12:52 PM 07/06/2020    2:04 PM 03/27/2020    3:09 PM  Fall Risk   Falls in the past year? 0 0 0 0 0  Number falls in past yr: 0 0 0 0 0  Injury with Fall?  0 0 0 0  Risk for fall due to :  No Fall Risks     Follow up Falls evaluation completed Falls evaluation completed Falls evaluation completed Falls evaluation completed Falls evaluation completed    FALL RISK PREVENTION PERTAINING TO THE HOME: Home free of loose throw rugs in walkways, pet beds, electrical cords, etc? Yes  Adequate lighting in your home to reduce risk of falls? Yes   ASSISTIVE DEVICES UTILIZED TO PREVENT FALLS: Life alert? No  Use of a cane, walker or w/c? No   TIMED UP AND GO: Was the test performed? No .   Cognitive  Function: Patient is alert and oriented x3.     12/24/2017    2:11 PM  MMSE - Mini Mental State Exam  Orientation to time 5  Orientation to Place 5  Registration 3  Attention/ Calculation 5  Recall 3  Language- name 2 objects 2  Language- repeat 1  Language- follow 3 step command 3  Language- read & follow direction 1  Write a sentence 1  Copy design 1  Total score 30        07/27/2019    1:54 PM  6CIT Screen  What Year? 0 points  What month? 0 points  What time? 0 points  Months in reverse 0 points    Immunizations Immunization History  Administered Date(s) Administered   PFIZER Comirnaty(Gray Top)Covid-19 Tri-Sucrose Vaccine 04/17/2020   PFIZER(Purple Top)SARS-COV-2 Vaccination 08/13/2019, 09/10/2019   Tdap/PNA/Shingrix vaccine- discontinued per patient.   Colonoscopy- deferred per patient.   Screening Tests Health Maintenance  Topic Date Due   COVID-19 Vaccine (4 - Booster for Pfizer series) 08/22/2021 (Originally 06/12/2020)   COLONOSCOPY (Pts 45-64yrs Insurance coverage will need to be confirmed)  10/21/2021 (Originally 07/28/1997)   INFLUENZA VACCINE  10/01/2021   MAMMOGRAM  09/29/2022   DEXA SCAN  Completed   Hepatitis C Screening  Completed   HPV VACCINES  Aged Out   Pneumonia Vaccine 2+ Years old  Discontinued   TETANUS/TDAP  Discontinued   Zoster Vaccines- Shingrix  Discontinued   Health Maintenance There are no preventive care reminders to display for this patient.  Lung Cancer Screening: Completed 07/11/21.   Vision Screening: Recommended annual ophthalmology exams for early detection of glaucoma and other disorders of the eye.  Dental Screening: Recommended annual dental exams for proper oral hygiene  Community Resource Referral / Chronic Care Management: CRR required this visit?  No   CCM required this visit?  No      Plan:   Keep all routine maintenance appointments.   I have personally reviewed and noted the following in the  patient's chart:   Medical and social history Use of alcohol, tobacco or illicit drugs  Current medications and supplements including opioid prescriptions.  Functional ability and status Nutritional status Physical activity Advanced directives List of other physicians Hospitalizations, surgeries, and ER visits in previous 12 months Vitals Screenings to include cognitive, depression, and falls Referrals and appointments  In addition, I have reviewed and discussed with patient certain preventive protocols, quality metrics, and best practice recommendations. A written personalized care plan for preventive services as well as general preventive health recommendations were provided to patient.  Ashok Pall, LPN   [id number]

## 2021-08-11 NOTE — Addendum Note (Signed)
Addended by: Quentin Ore on: 08/11/2021 11:06 PM   Modules accepted: Orders

## 2021-10-22 ENCOUNTER — Ambulatory Visit (INDEPENDENT_AMBULATORY_CARE_PROVIDER_SITE_OTHER): Payer: Medicare Other | Admitting: Internal Medicine

## 2021-10-22 ENCOUNTER — Encounter: Payer: Self-pay | Admitting: Internal Medicine

## 2021-10-22 ENCOUNTER — Telehealth: Payer: Self-pay | Admitting: Internal Medicine

## 2021-10-22 VITALS — BP 130/64 | HR 84 | Temp 98.3°F | Ht 65.0 in | Wt 137.0 lb

## 2021-10-22 DIAGNOSIS — G4700 Insomnia, unspecified: Secondary | ICD-10-CM | POA: Diagnosis not present

## 2021-10-22 DIAGNOSIS — J4520 Mild intermittent asthma, uncomplicated: Secondary | ICD-10-CM | POA: Diagnosis not present

## 2021-10-22 DIAGNOSIS — I10 Essential (primary) hypertension: Secondary | ICD-10-CM | POA: Diagnosis not present

## 2021-10-22 DIAGNOSIS — M5416 Radiculopathy, lumbar region: Secondary | ICD-10-CM

## 2021-10-22 MED ORDER — VERAPAMIL HCL ER 240 MG PO TBCR: EXTENDED_RELEASE_TABLET | ORAL | 3 refills | Status: DC

## 2021-10-22 MED ORDER — CYCLOBENZAPRINE HCL 10 MG PO TABS: ORAL_TABLET | ORAL | 11 refills | Status: DC

## 2021-10-22 MED ORDER — GABAPENTIN 300 MG PO CAPS: ORAL_CAPSULE | ORAL | 3 refills | Status: DC

## 2021-10-22 MED ORDER — ESZOPICLONE 2 MG PO TABS: 2.0000 mg | ORAL_TABLET | Freq: Every evening | ORAL | 5 refills | Status: DC | PRN

## 2021-10-22 MED ORDER — ALBUTEROL SULFATE HFA 108 (90 BASE) MCG/ACT IN AERS: 1.0000 | INHALATION_SPRAY | Freq: Four times a day (QID) | RESPIRATORY_TRACT | 11 refills | Status: AC | PRN

## 2021-10-22 NOTE — Patient Instructions (Addendum)
Dr. Clent Ridges or Dr. Lorin Picket   Dr. Thelma Barge cell # [phone]   Call and schedule mammogram [facility]    Consider colonoscopy with history of polyps with [facility] clinic  Dr. Vira Agar, [facility]   Phone Fax E-mail Address  [phone] Not available [address]   [place] [address]     Specialties     Gastroenterology

## 2021-10-22 NOTE — Progress Notes (Signed)
Chief Complaint  Patient presents with   Follow-up    3 month f/u   3 month  1. Chronic neck and back pain not taking norco still has left 5-325 mg bid and gabapentin takes 600 mg bid 2. Chronic insomnia did not try lunesta 2 mg due to cost 07/2020 will try again still not sleeping 3. Htn controlled on calan cr 240 mg qd  4. Asthma controlled on prn Albuterol    Review of Systems  Constitutional:  Negative for weight loss.  HENT:  Negative for hearing loss.   Eyes:  Negative for blurred vision.  Respiratory:  Negative for shortness of breath.   Cardiovascular:  Negative for chest pain.  Gastrointestinal:  Negative for abdominal pain and blood in stool.  Genitourinary:  Negative for dysuria.  Musculoskeletal:  Negative for falls and joint pain.  Skin:  Negative for rash.  Neurological:  Negative for headaches.  Psychiatric/Behavioral:  Negative for depression.    Past Medical History:  Diagnosis Date   Anxiety    Arthritis    right hand, DDD L 4 , L 5   Asthma    Blood in stool    Chronic intermittent post-traumatic headache    Colon polyps    Complication of anesthesia 2018   difficulty waking up    Depression    Family history of adverse reaction to anesthesia    Sister woke up during surgery   GERD (gastroesophageal reflux disease)    Head injury    Headache    h/o migraines    Hepatitis    Hepatitis C    treated with mavyret per GI   History of blood transfusion    Hypertension    Laryngopharyngeal reflux (LPR)    Multiple falls    Oral lichen planus    Pancreatitis    remote hx w/o h/o drinking at that time   Seizures ([place])     after head injury   Vitamin D deficiency    Past Surgical History:  Procedure Laterality Date   ABDOMINAL HYSTERECTOMY  1979   has an ovary left s/p hysterectomy 1979; h/o abnormal pap    acdf     08/07/16 Dr. Lynann Bologna    ANTERIOR CERVICAL DECOMP/DISCECTOMY FUSION N/A 08/07/2016   Procedure: ANTERIOR CERVICAL DECOMPRESSION FUSION,  CERVICAL FOUR-FIVE  WITH INSTRUMENTATION AND ALLOGRAFT;  Surgeon: Phylliss Bob, MD;  Location: [facility];  Service: Orthopedics;  Laterality: N/A;   APPENDECTOMY     BREAST BIOPSY Left    over 20 years ago negative left breast as of 03/2020    CESAREAN SECTION     COLON RESECTION     paritial ? reason done in [place]     after C Section  x 2 - 1 for bleeding and 1 for infection   HERNIA REPAIR     Inguinal - as a child   low back surgery     Dr. Lynann Bologna    TONSILLECTOMY     TRANSFORAMINAL LUMBAR INTERBODY FUSION (TLIF) WITH PEDICLE SCREW FIXATION 1 LEVEL Right 03/24/2018   Procedure: RIGHT LUMBAR 4 - LUMBAR 5 TRANSFORAMINAL LUMBAR INTERBODY FUSION WITH INSTRUMENTATION AND ALLOGRAFT;  Surgeon: Phylliss Bob, MD;  Location: [facility];  Service: Orthopedics;  Laterality: Right;   Family History  Problem Relation Age of Onset   Breast cancer Sister 42   Cancer Sister        breast    Diabetes  Sister    Miscarriages / [place] Sister    Cancer Mother        breast/precancer changes   Heart disease Mother    Hypertension Mother    Miscarriages / Stillbirths Mother    Heart failure Mother    Multiple sclerosis Father    Miscarriages / [place] Daughter    Arthritis Maternal Grandmother    Hyperlipidemia Maternal Grandmother    Learning disabilities Maternal Grandmother    Cancer Maternal Grandfather    Diabetes Paternal Grandmother    Arthritis Sister    Miscarriages / [place] Sister    Other Sister        precancerous changes breast   Social History   Socioeconomic History   Marital status: Divorced    Spouse name: Not on file   Number of children: Not on file   Years of education: Not on file   Highest education level: Not on file  Occupational History   Not on file  Tobacco Use   Smoking status: Former    Years: 30.00    Types: Cigarettes    Quit date: 2013    Years since quitting: 10.6   Smokeless tobacco: Never   Vaping Use   Vaping Use: Never used  Substance and Sexual Activity   Alcohol use: Yes    Alcohol/week: 7.0 standard drinks of alcohol    Types: 7 Glasses of wine per week    Comment: per week   Drug use: No   Sexual activity: Not on file  Other Topics Concern   Not on file  Social History Narrative   ** Merged History Encounter **       Associate degree  Wears seat belt, feels safe in marriage to [place] 1 daughter    Social Determinants of Health   Financial Resource Strain: Low Risk  (08/06/2021)   Overall Financial Resource Strain (CARDIA)    Difficulty of Paying Living Expenses: Not hard at all  Food Insecurity: No Food Insecurity (08/06/2021)   Hunger Vital Sign    Worried About Running Out of Food in the Last Year: Never true    [place] in the Last Year: Never true  Transportation Needs: No Transportation Needs (08/06/2021)   PRAPARE - Hydrologist (Medical): No    Lack of Transportation (Non-Medical): No  Physical Activity: Insufficiently Active (08/06/2021)   Exercise Vital Sign    Days of Exercise per Week: 4 days    Minutes of Exercise per Session: 30 min  Stress: No Stress Concern Present (08/06/2021)   [place]    Feeling of Stress : Not at all  Social Connections: Unknown (08/06/2021)   Social Connection and Isolation Panel [NHANES]    Frequency of Communication with Friends and Family: More than three times a week    Frequency of Social Gatherings with Friends and Family: More than three times a week    Attends Religious Services: Not on file    Active Member of Clubs or Organizations: Not on file    Attends Archivist Meetings: Not on file    Marital Status: Not on file  Intimate Partner Violence: Not At Risk (08/06/2021)   Humiliation, Afraid, Rape, and Kick questionnaire    Fear of Current or Ex-Partner: No    Emotionally Abused: No    Physically Abused:  No    Sexually Abused: No   Current Meds  Medication  Sig   Calcium Carbonate-Vitamin D (CALTRATE 600+D PO) Take 1 tablet by mouth 2 (two) times daily.    HYDROcodone-acetaminophen (NORCO) 5-325 MG tablet Take 1 tablet by mouth 2 (two) times daily as needed for moderate pain.   Multiple Vitamin (MULTIVITAMIN WITH MINERALS) TABS tablet Take 1 tablet by mouth 2 (two) times daily.    PREVIDENT 5000 SENSITIVE 1.1-5 % PSTE Place onto teeth as directed.   [DISCONTINUED] albuterol (VENTOLIN HFA) 108 (90 Base) MCG/ACT inhaler Inhale 1-2 puffs into the lungs every 6 (six) hours as needed for wheezing or shortness of breath.   [DISCONTINUED] cyclobenzaprine (FLEXERIL) 10 MG tablet TAKE 1 TABLET(10 MG) BY MOUTH TWICE DAILY AS NEEDED FOR MUSCLE SPASMS   [DISCONTINUED] eszopiclone (LUNESTA) 2 MG TABS tablet Take 0.5-1 tablets (1-2 mg total) by mouth at bedtime as needed for sleep. Take immediately before bedtime   [DISCONTINUED] gabapentin (NEURONTIN) 300 MG capsule TAKE 2 CAPSULES(600 MG) BY MOUTH TWICE DAILY   [DISCONTINUED] meloxicam (MOBIC) 7.5 MG tablet TAKE 1 TABLET(7.5 MG) BY MOUTH TWICE DAILY AS NEEDED FOR PAIN   [DISCONTINUED] verapamil (CALAN-SR) 240 MG CR tablet TAKE 1 TABLET(240 MG) BY MOUTH AT BEDTIME   Allergies  Allergen Reactions   Latex Hives   Sulfa Antibiotics Hives   Linaclotide Itching   Recent Results (from the past 2160 hour(s))  BASIC METABOLIC PANEL WITH GFR     Status: Abnormal   Collection Time: 08/02/21  1:50 PM  Result Value Ref Range   Glucose, Bld 107 (H) 65 - 99 mg/dL    Comment: .            Fasting reference interval . For someone without known diabetes, a glucose value between 100 and 125 mg/dL is consistent with prediabetes and should be confirmed with a follow-up test. .    BUN 13 7 - 25 mg/dL   Creat 1.11 (H) 0.50 - 1.05 mg/dL   eGFR 54 (L) > OR = 60 mL/min/1.70m    Comment: The eGFR is based on the CKD-EPI 2021 equation. To calculate  the new eGFR  from a previous Creatinine or Cystatin C result, go to https://www.kidney.org/professionals/ kdoqi/gfr%5Fcalculator    BUN/Creatinine Ratio 12 6 - 22 (calc)   Sodium 141 135 - 146 mmol/L   Potassium 3.8 3.5 - 5.3 mmol/L   Chloride 106 98 - 110 mmol/L   CO2 25 20 - 32 mmol/L   Calcium 9.0 8.6 - 10.4 mg/dL   Objective  Body mass index is 22.8 kg/m. Wt Readings from Last 3 Encounters:  10/22/21 137 lb (62.1 kg)  08/06/21 140 lb (63.5 kg)  07/11/21 140 lb 12.8 oz (63.9 kg)   Temp Readings from Last 3 Encounters:  10/22/21 98.3 F (36.8 C) (Oral)  07/11/21 98.4 F (36.9 C) (Oral)  07/06/20 98.3 F (36.8 C) (Oral)   BP Readings from Last 3 Encounters:  10/22/21 130/64  07/11/21 106/68  07/06/20 126/84   Pulse Readings from Last 3 Encounters:  10/22/21 84  07/11/21 90  07/06/20 82    Physical Exam Vitals and nursing note reviewed.  Constitutional:      Appearance: Normal appearance. She is well-developed and well-groomed.  HENT:     Head: Normocephalic and atraumatic.  Eyes:     Conjunctiva/sclera: Conjunctivae normal.     Pupils: Pupils are equal, round, and reactive to light.  Cardiovascular:     Rate and Rhythm: Normal rate and regular rhythm.     Heart sounds: Normal heart sounds.  No murmur heard. Pulmonary:     Effort: Pulmonary effort is normal.     Breath sounds: Normal breath sounds.  Abdominal:     General: Abdomen is flat. Bowel sounds are normal.     Tenderness: There is no abdominal tenderness.  Musculoskeletal:        General: No tenderness.  Skin:    General: Skin is warm and dry.  Neurological:     General: No focal deficit present.     Mental Status: She is alert and oriented to person, place, and time. Mental status is at baseline.     Cranial Nerves: Cranial nerves 2-12 are intact.     Motor: Motor function is intact.     Coordination: Coordination is intact.     Gait: Gait is intact.  Psychiatric:        Attention and Perception:  Attention and perception normal.        Mood and Affect: Mood and affect normal.        Speech: Speech normal.        Behavior: Behavior normal. Behavior is cooperative.        Thought Content: Thought content normal.        Cognition and Memory: Cognition and memory normal.        Judgment: Judgment normal.     Assessment  Plan  Essential hypertension controlled- Plan: verapamil (CALAN-SR) 240 MG CR tablet  Mild intermittent asthma without complication - Plan: albuterol (VENTOLIN HFA) 108 (90 Base) MCG/ACT inhaler  Lumbar radiculopathy - Plan: cyclobenzaprine (FLEXERIL) 10 MG tablet bid prn, gabapentin (NEURONTIN) 600 MG capsule bid  Insomnia, unspecified type - Plan: eszopiclone (LUNESTA) 2 MG TABS tablet   HM Declines flu vx  Sch pfizer 04/06/20 had 2/2 itd Disc Tdap vaccine, shringrix, Consider prevnar and pna 23 vaccines in future  -->pt declines all  Consider hep B vx in future  h/o hep C tx'ed f/u Dr. Allen Norris 11/29/19 negative not detected    Colonoscopy 2015 get records from [facility]  H/o polyps  No FH colon cancer  -rec [facility] as of today pt will think about it   Last pap 04/12/15 negative likely insurance will not cover another has medicare  -s/p hysterectomy with h/o cervical changes age 7 1 ovary intact    DEXA 09/09/2018 -1.8 to -1.9 osteopenia [facility]  f/u in 3-5 years    mammo 09/09/2018 normal [facility] negative ordered repeat  -h/o breast cyst per pt and asymmetries per last mammogram + FH breast cancer mother and sister pt agreeable  -consider [facility] genetics testing in future  04/17/20 [place] and mammo dx left negative -agreeable to [facility] 09/22/19 negative ordered again  Breast pain continues consider MRI breast and breast surgery -as of 10/2021 call to schedule   Former smoker 30 years quit in 2010 1-1.5 pk would last 1 week no FH lung cancer. -calc risk score 2.4% consider CT chest lung cancer screening in future disc at f/u  Rec healthy diet and exercise    Provider: Dr. Olivia Mackie McLean-Scocuzza-Internal Medicine

## 2021-10-22 NOTE — Telephone Encounter (Signed)
Pt states she will call back to sch 6 month follow up she has not decided whom she would like to see. Clent Ridges or scott) Thanks

## 2021-10-30 ENCOUNTER — Telehealth: Payer: Self-pay | Admitting: Internal Medicine

## 2021-10-30 NOTE — Telephone Encounter (Signed)
Pt need a refill on HYDROcodone sent to [facility] on [address]

## 2021-10-31 NOTE — Telephone Encounter (Signed)
The patient called again requesting a refill on Hydrocodone until she can get to pain management.

## 2021-11-01 ENCOUNTER — Telehealth: Payer: Self-pay

## 2021-11-01 ENCOUNTER — Other Ambulatory Visit: Payer: Self-pay | Admitting: Internal Medicine

## 2021-11-01 DIAGNOSIS — M5416 Radiculopathy, lumbar region: Secondary | ICD-10-CM

## 2021-11-01 DIAGNOSIS — Z9889 Other specified postprocedural states: Secondary | ICD-10-CM

## 2021-11-01 DIAGNOSIS — G8929 Other chronic pain: Secondary | ICD-10-CM

## 2021-11-01 DIAGNOSIS — M1990 Unspecified osteoarthritis, unspecified site: Secondary | ICD-10-CM

## 2021-11-01 MED ORDER — HYDROCODONE-ACETAMINOPHEN 5-325 MG PO TABS
1.0000 | ORAL_TABLET | Freq: Two times a day (BID) | ORAL | 0 refills | Status: DC | PRN
Start: 2021-11-01 — End: 2021-12-31

## 2021-11-01 NOTE — Telephone Encounter (Signed)
Sent and confirm appt pain clinic thanks

## 2021-11-01 NOTE — Telephone Encounter (Signed)
Pt called and sated this is her 3rd call in regards to her Hydrocodone refill. I told pt Dr. French Ana is really busy and will get to this as soon as she can pt stated she took her last one on last night and she is unable to take tylenol because of kidneys. Pt stated she has an appointment with pain management coming up but she needs medication. I told her I would let Dr. French Ana know again but I mentioned Dr. French Ana is busy.

## 2021-11-09 DIAGNOSIS — Z1231 Encounter for screening mammogram for malignant neoplasm of breast: Secondary | ICD-10-CM | POA: Diagnosis not present

## 2021-11-09 LAB — HM MAMMOGRAPHY

## 2021-11-13 ENCOUNTER — Encounter: Payer: Self-pay | Admitting: Internal Medicine

## 2021-12-17 ENCOUNTER — Encounter: Payer: Self-pay | Admitting: Student in an Organized Health Care Education/Training Program

## 2021-12-17 ENCOUNTER — Ambulatory Visit
Payer: Medicare Other | Attending: Student in an Organized Health Care Education/Training Program | Admitting: Student in an Organized Health Care Education/Training Program

## 2021-12-17 VITALS — BP 140/84 | HR 95 | Temp 97.1°F | Resp 16 | Ht 65.0 in | Wt 140.0 lb

## 2021-12-17 DIAGNOSIS — M961 Postlaminectomy syndrome, not elsewhere classified: Secondary | ICD-10-CM | POA: Insufficient documentation

## 2021-12-17 DIAGNOSIS — G8929 Other chronic pain: Secondary | ICD-10-CM | POA: Diagnosis not present

## 2021-12-17 DIAGNOSIS — M48062 Spinal stenosis, lumbar region with neurogenic claudication: Secondary | ICD-10-CM | POA: Insufficient documentation

## 2021-12-17 DIAGNOSIS — G894 Chronic pain syndrome: Secondary | ICD-10-CM | POA: Insufficient documentation

## 2021-12-17 DIAGNOSIS — M5136 Other intervertebral disc degeneration, lumbar region: Secondary | ICD-10-CM | POA: Insufficient documentation

## 2021-12-17 DIAGNOSIS — M5416 Radiculopathy, lumbar region: Secondary | ICD-10-CM | POA: Insufficient documentation

## 2021-12-17 DIAGNOSIS — M5412 Radiculopathy, cervical region: Secondary | ICD-10-CM | POA: Diagnosis not present

## 2021-12-17 HISTORY — DX: Postlaminectomy syndrome, not elsewhere classified: M96.1

## 2021-12-17 NOTE — Progress Notes (Signed)
Patient: Amy Grant  Service Category: E/M  Provider: Gillis Santa, MD  DOB: Jun 21, 1950  DOS: 12/17/2021  Referring Provider: Orland Mustard *  MRN: [id number]  Setting: Ambulatory outpatient  PCP: McLean-Scocuzza, Amy Glow, MD  Type: New Patient  Specialty: Interventional Pain Management    Location: Office  Delivery: Face-to-face     Primary Reason(s) for Visit: Encounter for initial evaluation of one or more chronic problems (new to examiner) potentially causing chronic pain, and posing a threat to normal musculoskeletal function. (Level of risk: High) CC: Back Pain (low) and Neck Pain (Left side)  HPI  Amy Grant is a 71 y.o. year old, female patient, who comes for the first time to our practice referred by McLean-Scocuzza, Amy Mackie * for our initial evaluation of her chronic pain. She has Chronic radicular lumbar pain; Chronic neck pain with history of cervical spinal surgery; Osteoarthritis; HTN (hypertension); Asthma; Hepatitis C; Radiculopathy; Chronic right-sided low back pain with right-sided sciatica; Dislocation of jaw; Cervical radicular pain; History of lumbar surgery; Numbness and tingling of both upper extremities; History of hepatitis C; Oral lichen planus; Insomnia; Failed back surgical syndrome; Spinal stenosis, lumbar region, with neurogenic claudication; Lumbar degenerative disc disease; and Chronic pain syndrome on their problem list. Today she comes in for evaluation of her Back Pain (low) and Neck Pain (Left side)  Pain Assessment: Location:   Back Radiating: radiates down right leg in the back and goes to front at knee, sometimes goes down left leg Onset: More than a month ago Duration: Chronic pain Quality: Tingling, Throbbing, Aching Severity: 2 /10 (subjective, self-reported pain score)  Effect on ADL: Limits activities Timing: Intermittent Modifying factors: stretching, meds BP: (!) 140/84  HR: 95  Onset and Duration: Gradual Cause of pain:  Unknown Severity: NAS-11 at its worse: 9/10, NAS-11 at its best: 4/10, NAS-11 now: 2/10, and NAS-11 on the average: 2/10 Timing: Night, Not influenced by the time of the day, and After activity or exercise Aggravating Factors: Bending, Lifiting, Prolonged sitting, Prolonged standing, and Walking Alleviating Factors: TENS Associated Problems: Night-time cramps, Dizziness, Fatigue, Inability to concentrate, Spasms, and Pain that does not allow patient to sleep Quality of Pain: Intermittent, Cramping, Sharp, Shooting, and Tingling Previous Examinations or Tests: CT scan, MRI scan, and Nerve block Previous Treatments: Epidural steroid injections, Narcotic medications, Physical Therapy, Stretching exercises, and TENS  Amy Grant is a pleasant 71 year old female who presents with 2 primary complaints: Low back pain with radiation into bilateral legs status post lumbar spine surgery in 2019.  She also has a history of neck pain status post C-spine surgery.  She states that her primary care provider recently retired and she is being referred here for assistance with chronic pain management.  She has done physical therapy in the past with limited response.  She is on gabapentin 600 mg twice a day.  She takes hydrocodone 5 mg as needed very seldomly.  Her last prescription was September 1 for a quantity of 20 and she still has tablets remaining.  She states that she only takes hydrocodone for severe breakthrough pain.  She is unable to tolerate NSAIDs given kidney dysfunction.  She has also been told by her primary care provider to avoid acetaminophen at higher doses.   Historic Controlled Substance Pharmacotherapy Review  PMP and historical list of controlled substances:   11/01/2021  11/01/2021   2  Hydrocodone-Acetamin 5-325 Mg 20.00  10  Tr Mcl  [id number]   [place] ([id number])  0/0  10.00 MME  Medicare  [employer]     Historical Monitoring: The patient  reports no history of drug use. List of prior UDS Testing: No results  found for: "MDMA", "COCAINSCRNUR", "PCPSCRNUR", "PCPQUANT", "CANNABQUANT", "THCU", "ETH", "CBDTHCR", "D8THCCBX", "D9THCCBX" Historical Background Evaluation: [place] PMP: PDMP reviewed during this encounter. Review of the past 38-month conducted.             [place] Department of public safety, offender search: (Editor, commissioningInformation) Non-contributory Risk Assessment Profile: Aberrant behavior: None observed or detected today Risk factors for fatal opioid overdose: None identified today Fatal overdose hazard ratio (HR): Calculation deferred Non-fatal overdose hazard ratio (HR): Calculation deferred Risk of opioid abuse or dependence: 0.7-3.0% with doses ? 36 MME/day and 6.1-26% with doses ? 120 MME/day. Substance use disorder (SUD) risk level: See below Personal History of Substance Abuse (SUD-Substance use disorder):  Alcohol: Negative  Illegal Drugs: Negative  Rx Drugs: Negative  ORT Risk Level calculation: Low Risk  Opioid Risk Tool - 12/17/21 0921       Family History of Substance Abuse   Alcohol Negative    Illegal Drugs Negative    Rx Drugs Negative      Personal History of Substance Abuse   Alcohol Negative    Illegal Drugs Negative    Rx Drugs Negative      Age   Age between 131-45years  No      History of Preadolescent Sexual Abuse   History of Preadolescent Sexual Abuse Negative or Female      Psychological Disease   Psychological Disease Negative    Depression Negative      Total Score   Opioid Risk Tool Scoring 0    Opioid Risk Interpretation Low Risk            ORT Scoring interpretation table:  Score <3 = Low Risk for SUD  Score between 4-7 = Moderate Risk for SUD  Score >8 = High Risk for Opioid Abuse   PHQ-2 Depression Scale:  Total score: 0  PHQ-2 Scoring interpretation table: (Score and probability of major depressive disorder)  Score 0 = No depression  Score 1 = 15.4% Probability  Score 2 = 21.1% Probability  Score 3 = 38.4% Probability  Score 4 =  45.5% Probability  Score 5 = 56.4% Probability  Score 6 = 78.6% Probability   PHQ-9 Depression Scale:  Total score: 0  PHQ-9 Scoring interpretation table:  Score 0-4 = No depression  Score 5-9 = Mild depression  Score 10-14 = Moderate depression  Score 15-19 = Moderately severe depression  Score 20-27 = Severe depression (2.4 times higher risk of SUD and 2.89 times higher risk of overuse)   Pharmacologic Plan: As per protocol, I have not taken over any controlled substance management, pending the results of ordered tests and/or consults.            Initial impression: Pending review of available data and ordered tests.  Meds   Current Outpatient Medications:    albuterol (VENTOLIN HFA) 108 (90 Base) MCG/ACT inhaler, Inhale 1-2 puffs into the lungs every 6 (six) hours as needed for wheezing or shortness of breath., Disp: 18 g, Rfl: 11   Calcium Carbonate-Vitamin D (CALTRATE 600+D PO), Take 1 tablet by mouth 2 (two) times daily. , Disp: , Rfl:    cyclobenzaprine (FLEXERIL) 10 MG tablet, TAKE 1 TABLET(10 MG) BY MOUTH TWICE DAILY AS NEEDED FOR MUSCLE SPASMS, Disp: 60 tablet, Rfl: 11   gabapentin (NEURONTIN) 300 MG capsule, TAKE 2  CAPSULES(600 MG) BY MOUTH TWICE DAILY, Disp: 240 capsule, Rfl: 3   HYDROcodone-acetaminophen (NORCO) 5-325 MG tablet, Take 1 tablet by mouth 2 (two) times daily as needed for moderate pain., Disp: 20 tablet, Rfl: 0   Multiple Vitamin (MULTIVITAMIN WITH MINERALS) TABS tablet, Take 1 tablet by mouth 2 (two) times daily. , Disp: , Rfl:    PREVIDENT 5000 SENSITIVE 1.1-5 % PSTE, Place onto teeth as directed., Disp: , Rfl:    verapamil (CALAN-SR) 240 MG CR tablet, TAKE 1 TABLET(240 MG) BY MOUTH AT BEDTIME, Disp: 90 tablet, Rfl: 3  Imaging Review  Cervical Imaging: Cervical MR wo contrast: Results for orders placed during the hospital encounter of 12/02/19  MR Cervical Spine Wo Contrast  Narrative CLINICAL DATA:  Neck pain with right arm pain and numbness for  2 months. Prior cervical fusion.  EXAM: MRI CERVICAL SPINE WITHOUT CONTRAST  TECHNIQUE: Multiplanar, multisequence MR imaging of the cervical spine was performed. No intravenous contrast was administered.  COMPARISON:  11/20/2016  FINDINGS: Alignment: Straightening/mild reversal of the normal cervical lordosis. Trace anterolisthesis of C2 on C3, C3 on C4, and C7 on T1.  Vertebrae: Prior C4-5 ACDF. No fracture or suspicious osseous lesion. Progressive degenerative endplate changes at M5-7 and C7-T1 including mild degenerative edema. New moderate right-sided facet edema at C5-6 which is degenerative in appearance.  Cord: T2 hyperintensity in the left greater than right cord at C4-5, stable to slightly increased compared to the prior study and consistent with myelomalacia.  Posterior Fossa, vertebral arteries, paraspinal tissues: Unremarkable.  Disc levels:  C2-3: Mild disc bulging and progressive severe left facet arthrosis without significant stenosis.  C3-4: Mild disc bulging, uncovertebral spurring, and mild right and severe left facet arthrosis result in mild left neural foraminal stenosis without spinal stenosis, unchanged.  C4-5: ACDF.  Bilateral facet ankylosis.  No stenosis.  C5-6: Moderate disc space narrowing. Disc bulging, asymmetric right uncovertebral spurring, and progressive severe right facet arthrosis result in increased moderate right neural foraminal stenosis with potential right C6 nerve root impingement. No spinal stenosis.  C6-7: Moderate disc space narrowing. Disc bulging and uncovertebral spurring result in progressive moderate left greater than right neural foraminal stenosis with potential impingement of the C7 nerve roots and mild spinal stenosis.  C7-T1: Moderate disc space narrowing. Disc bulging, uncovertebral spurring, and mild facet arthrosis result in moderate right and mild left neural foraminal stenosis with potential right C8 nerve  root impingement, unchanged. No spinal stenosis.  T1-2: Minimal disc bulging without stenosis.  IMPRESSION: 1. Progressive multilevel cervical disc and facet degeneration. 2. Moderate neural foraminal stenosis and mild spinal stenosis at C6-7. 3. Progressive severe right facet arthrosis at C5-6 with new facet edema and moderate right neural foraminal stenosis. 4. Unchanged moderate right neural foraminal stenosis at C7-T1. 5. C4-5 ACDF with myelomalacia.   Electronically Signed By: Logan Bores M.D. On: 12/03/2019 10:30    DG Cervical Spine 2 or 3 views  Narrative CLINICAL DATA:  Intraoperative imaging for C4-5 ACDF.  EXAM: CERVICAL SPINE - 2-3 VIEW; DG C-ARM 61-120 MIN  COMPARISON:  MRI cervical spine 07/20/2016.  FINDINGS: Two fluoroscopic intraoperative spot views in the lateral projection are provided. On the first image, a probe is at the level of the C4-5 interspace. On the second image, anterior plate and screws interbody spacer in place appear well positioned.  IMPRESSION: Intraoperative imaging for C4-5 ACDF.  No acute finding.   Electronically Signed By: Inge Rise M.D. On: 08/07/2016 14:38  MR  LUMBAR SPINE WO CONTRAST  Narrative CLINICAL DATA:  Chronic left lumbar radiculopathy; history of surgery  EXAM: MRI LUMBAR SPINE WITHOUT CONTRAST  TECHNIQUE: Multiplanar, multisequence MR imaging of the lumbar spine was performed. No intravenous contrast was administered.  COMPARISON:  12/02/2019  FINDINGS: Motion artifact is present.  Segmentation: Standard.  Alignment:  Similar anterolisthesis at L4-L5.  Vertebrae: Similar vertebral body heights with lower lumbar degenerative endplate irregularity. No substantial marrow edema. No suspicious osseous lesion. Postoperative changes are again identified at L4-L5 with rods and pedicle screws and interbody spacer. Hardware is not well evaluated and there is associated susceptibility  artifact.  Conus medullaris and cauda equina: Conus extends to the L1 level. Conus and cauda equina appear normal.  Paraspinal and other soft tissues: Unremarkable.  Disc levels:  L1-L2:  No canal or foraminal stenosis.  L2-L3: Mild facet arthropathy with ligamentum flavum infolding. No canal or foraminal stenosis.  L3-L4: Disc bulge. Moderate facet arthropathy with ligamentum flavum infolding. Minor canal and foraminal stenosis.  L4-L5: Operative level. Anterolisthesis with uncovering of possible residual disc eccentric to the right with endplate osteophytic ridging. Facet arthropathy with ligamentum flavum infolding. Mild canal stenosis. Moderate right foraminal stenosis. Minor left foraminal stenosis.  L5-S1: Disc bulge with endplate osteophytic ridging and probable superimposed new left foraminal extrusion. No canal stenosis. Facet arthropathy with ligamentum flavum infolding. No canal stenosis. Mild to moderate right and increased marked left foraminal stenosis.  IMPRESSION: Degenerative and operative changes as detailed above. There is a suspected new left foraminal disc herniation at L5-S1 with marked stenosis and exiting nerve root compression.   Electronically Signed By: Macy Mis M.D. On: 11/02/2020 16:02 DG Lumbar Spine 1 View  Narrative CLINICAL DATA:  Localization film for surgery  EXAM: LUMBAR SPINE - 1 VIEW  COMPARISON:  MRI January 26, 2018  FINDINGS: Localizing needles are seen posteriorly at L4 and L5. Anterolisthesis, grade 1, of L4 versus L5 is stable. No other interval changes. Lower lumbar facet degenerative changes. Degenerative disc disease at L5-S1.  IMPRESSION: Localizing needles at L4 and L5 as above.   Electronically Signed By: Dorise Bullion III M.D On: 03/24/2018 19:00   Narrative CLINICAL DATA:  Surgery at L4-5.  FLUOROSCOPY TIME:  45 seconds.  Images: 2  EXAM: LUMBAR SPINE - 2-3 VIEW  COMPARISON:   None.  FINDINGS: Pedicle rods and screws been placed at L4 and L5. A disc spacer device is seen at the same level.  IMPRESSION: Surgical hardware placed at L4 and L5 as above.   Electronically Signed By: Dorise Bullion III M.D On: 03/24/2018 21:15  Complexity Note: Imaging results reviewed.                         ROS  Cardiovascular: No reported cardiovascular signs or symptoms such as High blood pressure, coronary artery disease, abnormal heart rate or rhythm, heart attack, blood thinner therapy or heart weakness and/or failure Pulmonary or Respiratory: Wheezing and difficulty taking a deep full breath (Asthma) and Snoring  Neurological: No reported neurological signs or symptoms such as seizures, abnormal skin sensations, urinary and/or fecal incontinence, being born with an abnormal open spine and/or a tethered spinal cord Psychological-Psychiatric: Difficulty sleeping and or falling asleep Gastrointestinal: No reported gastrointestinal signs or symptoms such as vomiting or evacuating blood, reflux, heartburn, alternating episodes of diarrhea and constipation, inflamed or scarred liver, or pancreas or irrregular and/or infrequent bowel movements Genitourinary: No reported renal or genitourinary signs  or symptoms such as difficulty voiding or producing urine, peeing blood, non-functioning kidney, kidney stones, difficulty emptying the bladder, difficulty controlling the flow of urine, or chronic kidney disease Hematological: Abnormal red blood cell shape problems (Sickle Cell disease or trait) Endocrine: No reported endocrine signs or symptoms such as high or low blood sugar, rapid heart rate due to high thyroid levels, obesity or weight gain due to slow thyroid or thyroid disease Rheumatologic: No reported rheumatological signs and symptoms such as fatigue, joint pain, tenderness, swelling, redness, heat, stiffness, decreased range of motion, with or without associated  rash Musculoskeletal: Negative for myasthenia gravis, muscular dystrophy, multiple sclerosis or malignant hyperthermia Work History: Retired  Allergies  Ms. Karge is allergic to latex, sulfa antibiotics, and linaclotide.  Laboratory Chemistry Profile   Renal Lab Results  Component Value Date   BUN 13 08/02/2021   CREATININE 1.11 (H) 08/02/2021   BCR 12 08/02/2021   GFR 46.84 (L) 07/11/2021   GFRAA >60 03/18/2018   GFRNONAA >60 03/18/2018   SPECGRAV 1.011 12/31/2017   PHUR 7.5 12/31/2017   PROTEINUR NEGATIVE 09/16/2019     Electrolytes Lab Results  Component Value Date   NA 141 08/02/2021   K 3.8 08/02/2021   CL 106 08/02/2021   CALCIUM 9.0 08/02/2021     Hepatic Lab Results  Component Value Date   AST 21 07/11/2021   ALT 17 07/11/2021   ALBUMIN 4.3 07/11/2021   ALKPHOS 71 07/11/2021     ID Lab Results  Component Value Date   HIV Non Reactive 02/16/2018   STAPHAUREUS NEGATIVE 03/18/2018   MRSAPCR NEGATIVE 03/18/2018     Bone Lab Results  Component Value Date   VD25OH 66.24 12/31/2017     Endocrine Lab Results  Component Value Date   GLUCOSE 107 (H) 08/02/2021   GLUCOSEU NEGATIVE 09/16/2019   TSH 0.95 09/14/2020     Neuropathy Lab Results  Component Value Date   HIV Non Reactive 02/16/2018     CNS No results found for: "COLORCSF", "APPEARCSF", "RBCCOUNTCSF", "WBCCSF", "POLYSCSF", "LYMPHSCSF", "EOSCSF", "PROTEINCSF", "GLUCCSF", "JCVIRUS", "CSFOLI", "IGGCSF", "LABACHR", "ACETBL"   Inflammation (CRP: Acute  ESR: Chronic) No results found for: "CRP", "ESRSEDRATE", "LATICACIDVEN"   Rheumatology Lab Results  Component Value Date   ANA Negative 02/16/2018     Coagulation Lab Results  Component Value Date   INR 1.08 03/18/2018   LABPROT 13.9 03/18/2018   APTT 32 03/18/2018   PLT 169.0 07/11/2021   DDIMER 0.21 07/11/2021     Cardiovascular Lab Results  Component Value Date   HGB 12.2 07/11/2021   HCT 35.7 (L) 07/11/2021      Screening Lab Results  Component Value Date   STAPHAUREUS NEGATIVE 03/18/2018   MRSAPCR NEGATIVE 03/18/2018   HIV Non Reactive 02/16/2018     Cancer No results found for: "CEA", "CA125", "LABCA2"   Allergens No results found for: "ALMOND", "APPLE", "ASPARAGUS", "AVOCADO", "BANANA", "BARLEY", "BASIL", "BAYLEAF", "GREENBEAN", "LIMABEAN", "WHITEBEAN", "BEEFIGE", "REDBEET", "BLUEBERRY", "BROCCOLI", "CABBAGE", "MELON", "CARROT", "CASEIN", "CASHEWNUT", "CAULIFLOWER", "CELERY"     Note: Lab results reviewed.  [place]  Drug: Ms. Scarpati  reports no history of drug use. Alcohol:  reports current alcohol use of about 7.0 standard drinks of alcohol per week. Tobacco:  reports that she quit smoking about 10 years ago. Her smoking use included cigarettes. She has never used smokeless tobacco. Medical:  has a past medical history of Anxiety, Arthritis, Asthma, Blood in stool, Chronic intermittent post-traumatic headache, Colon polyps, Complication of anesthesia (2018), Depression,  Family history of adverse reaction to anesthesia, GERD (gastroesophageal reflux disease), Head injury, Headache, Hepatitis, Hepatitis C, History of blood transfusion, Hypertension, Laryngopharyngeal reflux (LPR), Multiple falls, Oral lichen planus, Pancreatitis, Seizures ([place]), and Vitamin D deficiency. Family: family history includes Arthritis in her maternal grandmother and sister; Breast cancer (age of onset: 41) in her sister; Cancer in her maternal grandfather, mother, and sister; Diabetes in her paternal grandmother and sister; Heart disease in her mother; Heart failure in her mother; Hyperlipidemia in her maternal grandmother; Hypertension in her mother; Learning disabilities in her maternal grandmother; Miscarriages / [place] in her daughter, mother, sister, and sister; Multiple sclerosis in her father; Other in her sister.  Past Surgical History:  Procedure Laterality Date   ABDOMINAL HYSTERECTOMY  1979   has an ovary  left s/p hysterectomy 1979; h/o abnormal pap    acdf     08/07/16 Dr. Lynann Bologna    ANTERIOR CERVICAL DECOMP/DISCECTOMY FUSION N/A 08/07/2016   Procedure: ANTERIOR CERVICAL DECOMPRESSION FUSION, CERVICAL FOUR-FIVE  WITH INSTRUMENTATION AND ALLOGRAFT;  Surgeon: Phylliss Bob, MD;  Location: [facility];  Service: Orthopedics;  Laterality: N/A;   APPENDECTOMY     BREAST BIOPSY Left    over 20 years ago negative left breast as of 03/2020    CESAREAN SECTION     COLON RESECTION     paritial ? reason done in [place]     after C Section  x 2 - 1 for bleeding and 1 for infection   HERNIA REPAIR     Inguinal - as a child   low back surgery     Dr. Lynann Bologna    TONSILLECTOMY     TRANSFORAMINAL LUMBAR INTERBODY FUSION (TLIF) WITH PEDICLE SCREW FIXATION 1 LEVEL Right 03/24/2018   Procedure: RIGHT LUMBAR 4 - LUMBAR 5 TRANSFORAMINAL LUMBAR INTERBODY FUSION WITH INSTRUMENTATION AND ALLOGRAFT;  Surgeon: Phylliss Bob, MD;  Location: [place];  Service: Orthopedics;  Laterality: Right;   Active Ambulatory Problems    Diagnosis Date Noted   Chronic radicular lumbar pain 08/07/2016   Chronic neck pain with history of cervical spinal surgery 03/28/2017   Osteoarthritis 03/28/2017   HTN (hypertension) 03/28/2017   Asthma 03/28/2017   Hepatitis C 01/06/2018   Radiculopathy 03/24/2018   Chronic right-sided low back pain with right-sided sciatica 06/30/2019   Dislocation of jaw 09/29/2019   Cervical radicular pain 11/16/2019   History of lumbar surgery 11/21/2019   Numbness and tingling of both upper extremities 12/12/2019   History of hepatitis C 93/81/0175   Oral lichen planus 12/25/8525   Insomnia 10/22/2021   Failed back surgical syndrome 12/17/2021   Spinal stenosis, lumbar region, with neurogenic claudication 12/17/2021   Lumbar degenerative disc disease 12/17/2021   Chronic pain syndrome 12/17/2021   Resolved Ambulatory Problems    Diagnosis Date Noted   No  Resolved Ambulatory Problems   Past Medical History:  Diagnosis Date   Anxiety    Arthritis    Blood in stool    Chronic intermittent post-traumatic headache    Colon polyps    Complication of anesthesia 2018   Depression    Family history of adverse reaction to anesthesia    GERD (gastroesophageal reflux disease)    Head injury    Headache    Hepatitis    History of blood transfusion    Hypertension    Laryngopharyngeal reflux (LPR)    Multiple falls    Pancreatitis  Seizures ([place])    Vitamin D deficiency    Constitutional Exam  General appearance: Well nourished, well developed, and well hydrated. In no apparent acute distress Vitals:   12/17/21 0908 12/17/21 0914  BP: (!) 140/84   Pulse: 95   Resp: 16   Temp: (!) 97.1 F (36.2 C) (!) 97.1 F (36.2 C)  SpO2: 100%   Weight: 140 lb (63.5 kg)   Height: 5' 5"  (1.651 m)    BMI Assessment: Estimated body mass index is 23.3 kg/m as calculated from the following:   Height as of this encounter: 5' 5"  (1.651 m).   Weight as of this encounter: 140 lb (63.5 kg).  BMI interpretation table: BMI level Category Range association with higher incidence of chronic pain  <18 kg/m2 Underweight   18.5-24.9 kg/m2 Ideal body weight   25-29.9 kg/m2 Overweight Increased incidence by 20%  30-34.9 kg/m2 Obese (Class I) Increased incidence by 68%  35-39.9 kg/m2 Severe obesity (Class II) Increased incidence by 136%  >40 kg/m2 Extreme obesity (Class III) Increased incidence by 254%   Patient's current BMI Ideal Body weight  Body mass index is 23.3 kg/m. Ideal body weight: 57 kg (125 lb 10.6 oz) Adjusted ideal body weight: 59.6 kg (131 lb 6.4 oz)   BMI Readings from Last 4 Encounters:  12/17/21 23.30 kg/m  10/22/21 22.80 kg/m  08/06/21 23.30 kg/m  07/11/21 23.43 kg/m   Wt Readings from Last 4 Encounters:  12/17/21 140 lb (63.5 kg)  10/22/21 137 lb (62.1 kg)  08/06/21 140 lb (63.5 kg)  07/11/21 140 lb 12.8 oz (63.9 kg)     Psych/Mental status: Alert, oriented x 3 (person, place, & time)       Eyes: PERLA Respiratory: No evidence of acute respiratory distress  Cervical Spine Area Exam  Skin & Axial Inspection: Well healed scar from previous spine surgery detected Alignment: Symmetrical Functional ROM: Pain restricted ROM, to the left Stability: No instability detected Muscle Tone/Strength: Functionally intact. No obvious neuro-muscular anomalies detected. Sensory (Neurological): Musculoskeletal pain pattern and neurogenic left c5/6 Palpation: No palpable anomalies              Upper Extremity (UE) Exam    Side: Right upper extremity  Side: Left upper extremity  Skin & Extremity Inspection: Skin color, temperature, and hair growth are WNL. No peripheral edema or cyanosis. No masses, redness, swelling, asymmetry, or associated skin lesions. No contractures.  Skin & Extremity Inspection: Skin color, temperature, and hair growth are WNL. No peripheral edema or cyanosis. No masses, redness, swelling, asymmetry, or associated skin lesions. No contractures.  Functional ROM: Unrestricted ROM          Functional ROM: Pain restricted ROM for shoulder and elbow  Muscle Tone/Strength: Functionally intact. No obvious neuro-muscular anomalies detected.  Muscle Tone/Strength: Functionally intact. No obvious neuro-muscular anomalies detected.  Sensory (Neurological): Neurogenic pain pattern          Sensory (Neurological): Neurogenic pain pattern          Palpation: No palpable anomalies              Palpation: No palpable anomalies              Provocative Test(s):  Phalen's test: deferred Tinel's test: deferred Apley's scratch test (touch opposite shoulder):  Action 1 (Across chest): deferred Action 2 (Overhead): deferred Action 3 (LB reach): deferred   Provocative Test(s):  Phalen's test: deferred Tinel's test: deferred Apley's scratch test (touch opposite shoulder):  Action  1 (Across chest): deferred Action 2  (Overhead): deferred Action 3 (LB reach): deferred    Lumbar Spine Area Exam  Skin & Axial Inspection: Well healed scar from previous spine surgery detected Alignment: Symmetrical Functional ROM: Pain restricted ROM affecting both sides Stability: No instability detected Muscle Tone/Strength: Functionally intact. No obvious neuro-muscular anomalies detected. Sensory (Neurological): Dermatomal pain pattern L4/5 Palpation: No palpable anomalies        Positive straight leg raise test bilaterally Ambulation: Unassisted Gait: Relatively normal for age and body habitus Posture: WNL   Lower Extremity Exam    Side: Right lower extremity  Side: Left lower extremity  Stability: No instability observed          Stability: No instability observed          Skin & Extremity Inspection: Skin color, temperature, and hair growth are WNL. No peripheral edema or cyanosis. No masses, redness, swelling, asymmetry, or associated skin lesions. No contractures.  Skin & Extremity Inspection: Skin color, temperature, and hair growth are WNL. No peripheral edema or cyanosis. No masses, redness, swelling, asymmetry, or associated skin lesions. No contractures.  Functional ROM: Unrestricted ROM                  Functional ROM: Unrestricted ROM                  Muscle Tone/Strength: Functionally intact. No obvious neuro-muscular anomalies detected.  Muscle Tone/Strength: Functionally intact. No obvious neuro-muscular anomalies detected.  Sensory (Neurological): Dermatomal pain pattern        Sensory (Neurological): Dermatomal pain pattern        DTR: Patellar: deferred today Achilles: deferred today Plantar: deferred today  DTR: Patellar: deferred today Achilles: deferred today Plantar: deferred today  Palpation: No palpable anomalies  Palpation: No palpable anomalies    Assessment  Primary Diagnosis & Pertinent Problem List: The primary encounter diagnosis was Chronic radicular lumbar pain. Diagnoses of  Post laminectomy syndrome, Failed back surgical syndrome, Spinal stenosis, lumbar region, with neurogenic claudication, Lumbar degenerative disc disease, Cervical radicular pain, and Chronic pain syndrome were also pertinent to this visit.  Visit Diagnosis (New problems to examiner): 1. Chronic radicular lumbar pain   2. Post laminectomy syndrome   3. Failed back surgical syndrome   4. Spinal stenosis, lumbar region, with neurogenic claudication   5. Lumbar degenerative disc disease   6. Cervical radicular pain   7. Chronic pain syndrome    Plan of Care (Initial workup plan)  Note: Ms. Tull was reminded that as per protocol, today's visit has been an evaluation only. We have not taken over the patient's controlled substance management.  General Recommendations: The pain condition that the patient suffers from is best treated with a multidisciplinary approach that involves an increase in physical activity to prevent de-conditioning and worsening of the pain cycle, as well as psychological counseling (formal and/or informal) to address the co-morbid psychological affects of pain. Treatment will often involve judicious use of pain medications and interventional procedures to decrease the pain, allowing the patient to participate in the physical activity that will ultimately produce long-lasting pain reductions. The goal of the multidisciplinary approach is to return the patient to a higher level of overall function and to restore their ability to perform activities of daily living.   Lab Orders         Compliance Drug Analysis, Ur         Drug Screen 10 W/Conf, Serum  Analgesic Pharmacological management:  Opioid Analgesics: Low dose Hydrocodone 5 mg (#45-60/3 months)  Membrane stabilizer: Adequate regimen continue Gabapentin  Muscle relaxant: To be determined at a later time  NSAID: Medically contraindicated  Other analgesic(s): To be determined at a later time   Interventional  management options: Ms. Piltz was informed that there is no guarantee that she would be a candidate for interventional therapies. The decision will be based on the results of diagnostic studies, as well as Ms. Cordrey's risk profile.  Procedure(s) under consideration:  Lumbar epidural steroid injection Lumbar facet medial branch nerve blocks Spinal cord stimulator trial Cervical facet medial branch nerve block Cervical epidural steroid injection SI joint intervention      I spent a total of 60 minutes reviewing chart data, face-to-face evaluation with the patient, counseling and coordination of care as detailed above.  Provider-requested follow-up: Return in about 2 weeks (around 12/31/2021) for 2nd pt visit F2F.  Future Appointments  Date Time Provider [place]  12/31/2021  2:00 PM Amy Santa, MD [facility] None    Note by: Amy Santa, MD Date: 12/17/2021; Time: 10:36 AM

## 2021-12-17 NOTE — Progress Notes (Deleted)
Nursing Pain Medication Assessment:  Safety precautions to be maintained throughout the outpatient stay will include: orient to surroundings, keep bed in low position, maintain call bell within reach at all times, provide assistance with transfer out of bed and ambulation.  Medication Inspection Compliance: Pill count conducted under aseptic conditions, in front of the patient. Neither the pills nor the bottle was removed from the patient's sight at any time. Once count was completed pills were immediately returned to the patient in their original bottle.  Medication: Oxycodone/APAP Pill/Patch Count:  10 of 90 pills remain Pill/Patch Appearance: Markings consistent with prescribed medication Bottle Appearance: Standard pharmacy container. Clearly labeled. Filled Date: 09 / 27 / 2023 Last Medication intake:  Today

## 2021-12-20 LAB — DRUG SCREEN 10 W/CONF, SERUM
Amphetamines, IA: NEGATIVE ng/mL
Barbiturates, IA: NEGATIVE ug/mL
Benzodiazepines, IA: NEGATIVE ng/mL
Cocaine & Metabolite, IA: NEGATIVE ng/mL
Methadone, IA: NEGATIVE ng/mL
Opiates, IA: NEGATIVE ng/mL
Oxycodones, IA: NEGATIVE ng/mL
Phencyclidine, IA: NEGATIVE ng/mL
Propoxyphene, IA: NEGATIVE ng/mL
THC(Marijuana) Metabolite, IA: NEGATIVE ng/mL

## 2021-12-31 ENCOUNTER — Encounter: Payer: Self-pay | Admitting: Student in an Organized Health Care Education/Training Program

## 2021-12-31 ENCOUNTER — Ambulatory Visit
Payer: Medicare Other | Attending: Student in an Organized Health Care Education/Training Program | Admitting: Student in an Organized Health Care Education/Training Program

## 2021-12-31 VITALS — BP 115/100 | HR 94 | Temp 97.3°F | Resp 16 | Ht 65.0 in | Wt 140.0 lb

## 2021-12-31 DIAGNOSIS — G8928 Other chronic postprocedural pain: Secondary | ICD-10-CM | POA: Insufficient documentation

## 2021-12-31 DIAGNOSIS — M961 Postlaminectomy syndrome, not elsewhere classified: Secondary | ICD-10-CM | POA: Insufficient documentation

## 2021-12-31 DIAGNOSIS — M48062 Spinal stenosis, lumbar region with neurogenic claudication: Secondary | ICD-10-CM | POA: Insufficient documentation

## 2021-12-31 DIAGNOSIS — M5136 Other intervertebral disc degeneration, lumbar region: Secondary | ICD-10-CM | POA: Diagnosis not present

## 2021-12-31 DIAGNOSIS — Z9889 Other specified postprocedural states: Secondary | ICD-10-CM | POA: Insufficient documentation

## 2021-12-31 DIAGNOSIS — G8929 Other chronic pain: Secondary | ICD-10-CM | POA: Insufficient documentation

## 2021-12-31 DIAGNOSIS — M542 Cervicalgia: Secondary | ICD-10-CM | POA: Diagnosis not present

## 2021-12-31 DIAGNOSIS — M5412 Radiculopathy, cervical region: Secondary | ICD-10-CM | POA: Diagnosis not present

## 2021-12-31 DIAGNOSIS — G894 Chronic pain syndrome: Secondary | ICD-10-CM | POA: Insufficient documentation

## 2021-12-31 DIAGNOSIS — M545 Low back pain, unspecified: Secondary | ICD-10-CM | POA: Insufficient documentation

## 2021-12-31 DIAGNOSIS — M1990 Unspecified osteoarthritis, unspecified site: Secondary | ICD-10-CM | POA: Diagnosis not present

## 2021-12-31 DIAGNOSIS — Z0289 Encounter for other administrative examinations: Secondary | ICD-10-CM | POA: Diagnosis not present

## 2021-12-31 DIAGNOSIS — M5416 Radiculopathy, lumbar region: Secondary | ICD-10-CM | POA: Diagnosis not present

## 2021-12-31 MED ORDER — HYDROCODONE-ACETAMINOPHEN 5-325 MG PO TABS: 1.0000 | ORAL_TABLET | Freq: Two times a day (BID) | ORAL | 0 refills | Status: AC | PRN

## 2021-12-31 NOTE — Progress Notes (Signed)
PROVIDER NOTE: Information contained herein reflects review and annotations entered in association with encounter. Interpretation of such information and data should be left to medically-trained personnel. Information provided to patient can be located elsewhere in the medical record under "Patient Instructions". Document created using STT-dictation technology, any transcriptional errors that may result from process are unintentional.    Patient: Amy Grant  Service Category: E/M  Provider: Gillis Santa, MD  DOB: Jun 27, 1950  DOS: 12/31/2021  Referring Provider: Orland Mustard *  MRN: [id number]  Specialty: Interventional Pain Management  PCP: McLean-Scocuzza, Nino Glow, MD  Type: Established Patient  Setting: Ambulatory outpatient    Location: Office  Delivery: Face-to-face     Primary Reason(s) for Visit: Encounter for evaluation before starting new chronic pain management plan of care (Level of risk: moderate) CC: Back Pain  HPI  Amy Grant is a 71 y.o. year old, female patient, who comes today for a follow-up evaluation to review the test results and decide on a treatment plan. She has Chronic radicular lumbar pain; Chronic neck pain with history of cervical spinal surgery; Osteoarthritis; HTN (hypertension); Asthma; Hepatitis C; Radiculopathy; Chronic right-sided low back pain with right-sided sciatica; Dislocation of jaw; Cervical radicular pain; History of lumbar surgery; Numbness and tingling of both upper extremities; History of hepatitis C; Oral lichen planus; Insomnia; Failed back surgical syndrome; Spinal stenosis, lumbar region, with neurogenic claudication; Lumbar degenerative disc disease; Chronic pain syndrome; and Pain management contract signed on their problem list. Her primarily concern today is the Back Pain  Pain Assessment: Location: Lower Back Radiating: radiates to hips bilateral aound to fron of leg to knee, will sometimes radiate left (right side is worse) Onset:  More than a month ago Duration: Chronic pain Quality: Aching, Constant, Tingling, Discomfort, Pins and needles, Tender Severity:  /10 (subjective, self-reported pain score)  Effect on ADL: prolonged standing and walking, standing up from couch, ADL's Timing: Constant Modifying factors: streching, meds BP: (!) 115/100  HR: 94  Amy Grant comes in today for a follow-up visit after her initial evaluation on 12/17/2021. Today we went over the results of her tests. These were explained in "Layman's terms". During today's appointment we went over my diagnostic impression, as well as the proposed treatment plan.  HPI from initial clinic visit 12/17/2021: Amy Grant is a pleasant 71 year old female who presents with 2 primary complaints: Low back pain with radiation into bilateral legs status post lumbar spine surgery in 2019.  She also has a history of neck pain status post C-spine surgery.  She states that her primary care provider recently retired and she is being referred here for assistance with chronic pain management.  She has done physical therapy in the past with limited response.  She is on gabapentin 600 mg twice a day.  She takes hydrocodone 5 mg as needed very seldomly.  Her last prescription was September 1 for a quantity of 20 and she still has tablets remaining.  She states that she only takes hydrocodone for severe breakthrough pain.  She is unable to tolerate NSAIDs given kidney dysfunction.  She has also been told by her primary care provider to avoid acetaminophen at higher doses.    Controlled Substance Pharmacotherapy Assessment REMS (Risk Evaluation and Mitigation Strategy)  Opioid Analgesic:  hydrocodone 5 mg daily as needed Pill Count: None expected due to no prior prescriptions written by our practice. Amy Specking, RN  12/31/2021  1:55 PM  Sign when Signing Visit Safety precautions to be maintained throughout  the outpatient stay will include: orient to surroundings, keep bed  in low position, maintain call bell within reach at all times, provide assistance with transfer out of bed and ambulation.    Pharmacokinetics: Liberation and absorption (onset of action): WNL Distribution (time to peak effect): WNL Metabolism and excretion (duration of action): WNL         Pharmacodynamics: Desired effects: Analgesia: Amy Grant reports >50% benefit. Functional ability: Patient reports that medication allows her to accomplish basic ADLs Clinically meaningful improvement in function (CMIF): Sustained CMIF goals met Perceived effectiveness: Described as relatively effective, allowing for increase in activities of daily living (ADL) Undesirable effects: Side-effects or Adverse reactions: None reported Monitoring: [place] PMP: PDMP not reviewed this encounter. Online review of the past 24-monthperiod previously conducted. Not applicable at this point since we have not taken over the patient's medication management yet. List of other Serum/Urine Drug Screening Test(s):  Lab Results  Component Value Date   AMPHSCRSER Negative 12/17/2021   BARBSCRSER Negative 12/17/2021   BENZOSCRSER Negative 12/17/2021   COCAINSCRSER Negative 12/17/2021   PCPSCRSER Negative 12/17/2021   THCSCRSER Negative 12/17/2021   OPIATESCRSER Negative 12/17/2021   OXYSCRSER Negative 12/17/2021   PROPOXSCRSER Negative 12/17/2021   List of all UDS test(s) done:  No results found for: "TOXASSSELUR", "SUMMARY" Last UDS on record: No results found for: "TOXASSSELUR", "SUMMARY" UDS interpretation: No unexpected findings.          Medication Assessment Form: Not applicable. No opioids. Treatment compliance: Not applicable Risk Assessment Profile: Aberrant behavior: See initial evaluations. None observed or detected today Comorbid factors increasing risk of overdose: See initial evaluation. No additional risks detected today Opioid risk tool (ORT):     12/31/2021    1:55 PM  Opioid Risk   Alcohol 0   Illegal Drugs 0  Rx Drugs 0  Psychological Disease 0  Depression 0  Opioid Risk Tool Scoring 0  Opioid Risk Interpretation Low Risk    ORT Scoring interpretation table:  Score <3 = Low Risk for SUD  Score between 4-7 = Moderate Risk for SUD  Score >8 = High Risk for Opioid Abuse   Risk of substance use disorder (SUD): Low  Risk Mitigation Strategies:  Patient opioid safety counseling: Completed today. Counseling provided to patient as per "Patient Counseling Document". Document signed by patient, attesting to counseling and understanding Patient-Prescriber Agreement (PPA): Obtained today.  Controlled substance notification to other providers: Written and sent today.  Pharmacologic Plan: Today we may be taking over the patient's pharmacological regimen. See below.             Laboratory Chemistry Profile   Renal Lab Results  Component Value Date   BUN 13 08/02/2021   CREATININE 1.11 (H) 08/02/2021   BCR 12 08/02/2021   GFR 46.84 (L) 07/11/2021   GFRAA >60 03/18/2018   GFRNONAA >60 03/18/2018   SPECGRAV 1.011 12/31/2017   PHUR 7.5 12/31/2017   PROTEINUR NEGATIVE 09/16/2019     Electrolytes Lab Results  Component Value Date   NA 141 08/02/2021   K 3.8 08/02/2021   CL 106 08/02/2021   CALCIUM 9.0 08/02/2021     Hepatic Lab Results  Component Value Date   AST 21 07/11/2021   ALT 17 07/11/2021   ALBUMIN 4.3 07/11/2021   ALKPHOS 71 07/11/2021     ID Lab Results  Component Value Date   HIV Non Reactive 02/16/2018   STAPHAUREUS NEGATIVE 03/18/2018   MRSAPCR NEGATIVE 03/18/2018  Bone Lab Results  Component Value Date   VD25OH 66.24 12/31/2017     Endocrine Lab Results  Component Value Date   GLUCOSE 107 (H) 08/02/2021   GLUCOSEU NEGATIVE 09/16/2019   TSH 0.95 09/14/2020     Neuropathy Lab Results  Component Value Date   HIV Non Reactive 02/16/2018     CNS No results found for: "COLORCSF", "APPEARCSF", "RBCCOUNTCSF", "WBCCSF", "POLYSCSF",  "LYMPHSCSF", "EOSCSF", "PROTEINCSF", "GLUCCSF", "JCVIRUS", "CSFOLI", "IGGCSF", "LABACHR", "ACETBL"   Inflammation (CRP: Acute  ESR: Chronic) No results found for: "CRP", "ESRSEDRATE", "LATICACIDVEN"   Rheumatology Lab Results  Component Value Date   ANA Negative 02/16/2018     Coagulation Lab Results  Component Value Date   INR 1.08 03/18/2018   LABPROT 13.9 03/18/2018   APTT 32 03/18/2018   PLT 169.0 07/11/2021   DDIMER 0.21 07/11/2021     Cardiovascular Lab Results  Component Value Date   HGB 12.2 07/11/2021   HCT 35.7 (L) 07/11/2021     Screening Lab Results  Component Value Date   STAPHAUREUS NEGATIVE 03/18/2018   MRSAPCR NEGATIVE 03/18/2018   HIV Non Reactive 02/16/2018     Cancer No results found for: "CEA", "CA125", "LABCA2"   Allergens No results found for: "ALMOND", "APPLE", "ASPARAGUS", "AVOCADO", "BANANA", "BARLEY", "BASIL", "BAYLEAF", "GREENBEAN", "LIMABEAN", "WHITEBEAN", "BEEFIGE", "REDBEET", "BLUEBERRY", "BROCCOLI", "CABBAGE", "MELON", "CARROT", "CASEIN", "CASHEWNUT", "CAULIFLOWER", "CELERY"     Note: Lab results reviewed.  Recent Diagnostic Imaging Review   MR Cervical Spine Wo Contrast  Narrative CLINICAL DATA:  Neck pain with right arm pain and numbness for 2 months. Prior cervical fusion.  EXAM: MRI CERVICAL SPINE WITHOUT CONTRAST  TECHNIQUE: Multiplanar, multisequence MR imaging of the cervical spine was performed. No intravenous contrast was administered.  COMPARISON:  11/20/2016  FINDINGS: Alignment: Straightening/mild reversal of the normal cervical lordosis. Trace anterolisthesis of C2 on C3, C3 on C4, and C7 on T1.  Vertebrae: Prior C4-5 ACDF. No fracture or suspicious osseous lesion. Progressive degenerative endplate changes at B5-5 and C7-T1 including mild degenerative edema. New moderate right-sided facet edema at C5-6 which is degenerative in appearance.  Cord: T2 hyperintensity in the left greater than right cord at  C4-5, stable to slightly increased compared to the prior study and consistent with myelomalacia.  Posterior Fossa, vertebral arteries, paraspinal tissues: Unremarkable.  Disc levels:  C2-3: Mild disc bulging and progressive severe left facet arthrosis without significant stenosis.  C3-4: Mild disc bulging, uncovertebral spurring, and mild right and severe left facet arthrosis result in mild left neural foraminal stenosis without spinal stenosis, unchanged.  C4-5: ACDF.  Bilateral facet ankylosis.  No stenosis.  C5-6: Moderate disc space narrowing. Disc bulging, asymmetric right uncovertebral spurring, and progressive severe right facet arthrosis result in increased moderate right neural foraminal stenosis with potential right C6 nerve root impingement. No spinal stenosis.  C6-7: Moderate disc space narrowing. Disc bulging and uncovertebral spurring result in progressive moderate left greater than right neural foraminal stenosis with potential impingement of the C7 nerve roots and mild spinal stenosis.  C7-T1: Moderate disc space narrowing. Disc bulging, uncovertebral spurring, and mild facet arthrosis result in moderate right and mild left neural foraminal stenosis with potential right C8 nerve root impingement, unchanged. No spinal stenosis.  T1-2: Minimal disc bulging without stenosis.  IMPRESSION: 1. Progressive multilevel cervical disc and facet degeneration. 2. Moderate neural foraminal stenosis and mild spinal stenosis at C6-7. 3. Progressive severe right facet arthrosis at C5-6 with new facet edema and moderate right neural foraminal stenosis. 4.  Unchanged moderate right neural foraminal stenosis at C7-T1. 5. C4-5 ACDF with myelomalacia.   Electronically Signed By: Logan Bores M.D. On: 12/03/2019 10:30  MR LUMBAR SPINE WO CONTRAST  Narrative CLINICAL DATA:  Chronic left lumbar radiculopathy; history of surgery  EXAM: MRI LUMBAR SPINE WITHOUT  CONTRAST  TECHNIQUE: Multiplanar, multisequence MR imaging of the lumbar spine was performed. No intravenous contrast was administered.  COMPARISON:  12/02/2019  FINDINGS: Motion artifact is present.  Segmentation: Standard.  Alignment:  Similar anterolisthesis at L4-L5.  Vertebrae: Similar vertebral body heights with lower lumbar degenerative endplate irregularity. No substantial marrow edema. No suspicious osseous lesion. Postoperative changes are again identified at L4-L5 with rods and pedicle screws and interbody spacer. Hardware is not well evaluated and there is associated susceptibility artifact.  Conus medullaris and cauda equina: Conus extends to the L1 level. Conus and cauda equina appear normal.  Paraspinal and other soft tissues: Unremarkable.  Disc levels:  L1-L2:  No canal or foraminal stenosis.  L2-L3: Mild facet arthropathy with ligamentum flavum infolding. No canal or foraminal stenosis.  L3-L4: Disc bulge. Moderate facet arthropathy with ligamentum flavum infolding. Minor canal and foraminal stenosis.  L4-L5: Operative level. Anterolisthesis with uncovering of possible residual disc eccentric to the right with endplate osteophytic ridging. Facet arthropathy with ligamentum flavum infolding. Mild canal stenosis. Moderate right foraminal stenosis. Minor left foraminal stenosis.  L5-S1: Disc bulge with endplate osteophytic ridging and probable superimposed new left foraminal extrusion. No canal stenosis. Facet arthropathy with ligamentum flavum infolding. No canal stenosis. Mild to moderate right and increased marked left foraminal stenosis.  IMPRESSION: Degenerative and operative changes as detailed above. There is a suspected new left foraminal disc herniation at L5-S1 with marked stenosis and exiting nerve root compression.   Electronically Signed By: Macy Mis M.D. On: 11/02/2020 16:02 Complexity Note: Imaging results reviewed.                          Meds   Current Outpatient Medications:    albuterol (VENTOLIN HFA) 108 (90 Base) MCG/ACT inhaler, Inhale 1-2 puffs into the lungs every 6 (six) hours as needed for wheezing or shortness of breath., Disp: 18 g, Rfl: 11   Calcium Carbonate-Vitamin D (CALTRATE 600+D PO), Take 1 tablet by mouth 2 (two) times daily. , Disp: , Rfl:    cyclobenzaprine (FLEXERIL) 10 MG tablet, TAKE 1 TABLET(10 MG) BY MOUTH TWICE DAILY AS NEEDED FOR MUSCLE SPASMS, Disp: 60 tablet, Rfl: 11   gabapentin (NEURONTIN) 300 MG capsule, TAKE 2 CAPSULES(600 MG) BY MOUTH TWICE DAILY, Disp: 240 capsule, Rfl: 3   Multiple Vitamin (MULTIVITAMIN WITH MINERALS) TABS tablet, Take 1 tablet by mouth 2 (two) times daily. , Disp: , Rfl:    PREVIDENT 5000 SENSITIVE 1.1-5 % PSTE, Place onto teeth as directed., Disp: , Rfl:    verapamil (CALAN-SR) 240 MG CR tablet, TAKE 1 TABLET(240 MG) BY MOUTH AT BEDTIME, Disp: 90 tablet, Rfl: 3   HYDROcodone-acetaminophen (NORCO) 5-325 MG tablet, Take 1 tablet by mouth 2 (two) times daily as needed for moderate pain., Disp: 60 tablet, Rfl: 0  ROS  Constitutional: Denies any fever or chills Gastrointestinal: No reported hemesis, hematochezia, vomiting, or acute GI distress Musculoskeletal: Denies any acute onset joint swelling, redness, loss of ROM, or weakness Neurological: No reported episodes of acute onset apraxia, aphasia, dysarthria, agnosia, amnesia, paralysis, loss of coordination, or loss of consciousness  Allergies  Ms. Swader is allergic to latex, sulfa antibiotics,  and linaclotide.  [place]  Drug: Ms. Oguin  reports no history of drug use. Alcohol:  reports current alcohol use of about 7.0 standard drinks of alcohol per week. Tobacco:  reports that she quit smoking about 10 years ago. Her smoking use included cigarettes. She has never used smokeless tobacco. Medical:  has a past medical history of Anxiety, Arthritis, Asthma, Blood in stool, Chronic intermittent post-traumatic  headache, Colon polyps, Complication of anesthesia (2018), Depression, Family history of adverse reaction to anesthesia, GERD (gastroesophageal reflux disease), Head injury, Headache, Hepatitis, Hepatitis C, History of blood transfusion, Hypertension, Laryngopharyngeal reflux (LPR), Multiple falls, Oral lichen planus, Pancreatitis, Seizures ([place]), and Vitamin D deficiency. Surgical: Ms. Bartley  has a past surgical history that includes Hernia repair; Cesarean section; Exploratory laparotomy; Tonsillectomy; Appendectomy; Colon resection; Colonoscopy; Anterior cervical decomp/discectomy fusion (N/A, 08/07/2016); Breast biopsy (Left); Abdominal hysterectomy (1979); acdf; Transforaminal lumbar interbody fusion (tlif) with pedicle screw fixation 1 level (Right, 03/24/2018); and low back surgery. Family: family history includes Arthritis in her maternal grandmother and sister; Breast cancer (age of onset: 61) in her sister; Cancer in her maternal grandfather, mother, and sister; Diabetes in her paternal grandmother and sister; Heart disease in her mother; Heart failure in her mother; Hyperlipidemia in her maternal grandmother; Hypertension in her mother; Learning disabilities in her maternal grandmother; Miscarriages / [place] in her daughter, mother, sister, and sister; Multiple sclerosis in her father; Other in her sister.  Constitutional Exam  General appearance: Well nourished, well developed, and well hydrated. In no apparent acute distress Vitals:   12/31/21 1349  BP: (!) 115/100  Pulse: 94  Resp: 16  Temp: (!) 97.3 F (36.3 C)  SpO2: 99%  Weight: 140 lb (63.5 kg)  Height: _0  (1.651 m)   BMI Assessment: Estimated body mass index is 23.3 kg/m as calculated from the following:   Height as of this encounter: _1  (1.651 m).   Weight as of this encounter: 140 lb (63.5 kg).  BMI interpretation table: BMI level Category Range association with higher incidence of chronic pain  <18 kg/m2  Underweight   18.5-24.9 kg/m2 Ideal body weight   25-29.9 kg/m2 Overweight Increased incidence by 20%  30-34.9 kg/m2 Obese (Class I) Increased incidence by 68%  35-39.9 kg/m2 Severe obesity (Class II) Increased incidence by 136%  >40 kg/m2 Extreme obesity (Class III) Increased incidence by 254%   Patient's current BMI Ideal Body weight  Body mass index is 23.3 kg/m. Ideal body weight: 57 kg (125 lb 10.6 oz) Adjusted ideal body weight: 59.6 kg (131 lb 6.4 oz)   BMI Readings from Last 4 Encounters:  12/31/21 23.30 kg/m  12/17/21 23.30 kg/m  10/22/21 22.80 kg/m  08/06/21 23.30 kg/m   Wt Readings from Last 4 Encounters:  12/31/21 140 lb (63.5 kg)  12/17/21 140 lb (63.5 kg)  10/22/21 137 lb (62.1 kg)  08/06/21 140 lb (63.5 kg)    Psych/Mental status: Alert, oriented x 3 (person, place, & time)       Eyes: PERLA Respiratory: No evidence of acute respiratory distress    Cervical Spine Area Exam  Skin & Axial Inspection: Well healed scar from previous spine surgery detected Alignment: Symmetrical Functional ROM: Pain restricted ROM, to the left Stability: No instability detected Muscle Tone/Strength: Functionally intact. No obvious neuro-muscular anomalies detected. Sensory (Neurological): Musculoskeletal pain pattern and neurogenic left c5/6 Palpation: No palpable anomalies               Upper Extremity (UE) Exam  Side: Right upper extremity   Side: Left upper extremity  Skin & Extremity Inspection: Skin color, temperature, and hair growth are WNL. No peripheral edema or cyanosis. No masses, redness, swelling, asymmetry, or associated skin lesions. No contractures.   Skin & Extremity Inspection: Skin color, temperature, and hair growth are WNL. No peripheral edema or cyanosis. No masses, redness, swelling, asymmetry, or associated skin lesions. No contractures.  Functional ROM: Unrestricted ROM           Functional ROM: Pain restricted ROM for shoulder and elbow  Muscle  Tone/Strength: Functionally intact. No obvious neuro-muscular anomalies detected.   Muscle Tone/Strength: Functionally intact. No obvious neuro-muscular anomalies detected.  Sensory (Neurological): Neurogenic pain pattern           Sensory (Neurological): Neurogenic pain pattern          Palpation: No palpable anomalies               Palpation: No palpable anomalies              Provocative Test(s):  Phalen's test: deferred Tinel's test: deferred Apley's scratch test (touch opposite shoulder):  Action 1 (Across chest): deferred Action 2 (Overhead): deferred Action 3 (LB reach): deferred     Provocative Test(s):  Phalen's test: deferred Tinel's test: deferred Apley's scratch test (touch opposite shoulder):  Action 1 (Across chest): deferred Action 2 (Overhead): deferred Action 3 (LB reach): deferred      Lumbar Spine Area Exam  Skin & Axial Inspection: Well healed scar from previous spine surgery detected Alignment: Symmetrical Functional ROM: Pain restricted ROM affecting both sides Stability: No instability detected Muscle Tone/Strength: Functionally intact. No obvious neuro-muscular anomalies detected. Sensory (Neurological): Dermatomal pain pattern L4/5 Palpation: No palpable anomalies         Positive straight leg raise test bilaterally Ambulation: Unassisted Gait: Relatively normal for age and body habitus Posture: WNL    Lower Extremity Exam      Side: Right lower extremity   Side: Left lower extremity  Stability: No instability observed           Stability: No instability observed          Skin & Extremity Inspection: Skin color, temperature, and hair growth are WNL. No peripheral edema or cyanosis. No masses, redness, swelling, asymmetry, or associated skin lesions. No contractures.   Skin & Extremity Inspection: Skin color, temperature, and hair growth are WNL. No peripheral edema or cyanosis. No masses, redness, swelling, asymmetry, or associated skin lesions. No  contractures.  Functional ROM: Unrestricted ROM                   Functional ROM: Unrestricted ROM                  Muscle Tone/Strength: Functionally intact. No obvious neuro-muscular anomalies detected.   Muscle Tone/Strength: Functionally intact. No obvious neuro-muscular anomalies detected.  Sensory (Neurological): Dermatomal pain pattern         Sensory (Neurological): Dermatomal pain pattern        DTR: Patellar: deferred today Achilles: deferred today Plantar: deferred today   DTR: Patellar: deferred today Achilles: deferred today Plantar: deferred today  Palpation: No palpable anomalies   Palpation: No palpable anomalies     Assessment & Plan  Primary Diagnosis & Pertinent Problem List: The primary encounter diagnosis was Chronic radicular lumbar pain. Diagnoses of Post laminectomy syndrome, Pain management contract signed, Failed back surgical syndrome, Spinal stenosis, lumbar region, with neurogenic claudication, Lumbar  degenerative disc disease, Cervical radicular pain, Lumbar radiculopathy, Chronic low back pain, unspecified back pain laterality, unspecified whether sciatica present, Chronic neck pain with history of cervical spinal surgery, Osteoarthritis, unspecified osteoarthritis type, unspecified site, Other chronic pain, and Chronic pain syndrome were also pertinent to this visit.  Visit Diagnosis: 1. Chronic radicular lumbar pain   2. Post laminectomy syndrome   3. Pain management contract signed   4. Failed back surgical syndrome   5. Spinal stenosis, lumbar region, with neurogenic claudication   6. Lumbar degenerative disc disease   7. Cervical radicular pain   8. Lumbar radiculopathy   9. Chronic low back pain, unspecified back pain laterality, unspecified whether sciatica present   10. Chronic neck pain with history of cervical spinal surgery   11. Osteoarthritis, unspecified osteoarthritis type, unspecified site   12. Other chronic pain   13. Chronic pain  syndrome    Problems updated and reviewed during this visit: Problem  Pain Management Contract Signed    Plan of Care  Pharmacotherapy (Medications Ordered): Meds ordered this encounter  Medications   HYDROcodone-acetaminophen (NORCO) 5-325 MG tablet    Sig: Take 1 tablet by mouth 2 (two) times daily as needed for moderate pain.    Dispense:  60 tablet    Refill:  0    Analgesic Pharmacological management:  Opioid Analgesics: Low dose Hydrocodone 5 mg (#60/3 months)  Membrane stabilizer: Adequate regimen continue Gabapentin  Muscle relaxant: To be determined at a later time  NSAID: Medically contraindicated  Other analgesic(s): To be determined at a later time    Interventional management options: Ms. Myers was informed that there is no guarantee that she would be a candidate for interventional therapies. The decision will be based on the results of diagnostic studies, as well as Ms. Sur's risk profile.  Procedure(s) under consideration:  Lumbar epidural steroid injection Lumbar facet medial branch nerve blocks Spinal cord stimulator trial Cervical facet medial branch nerve block Cervical epidural steroid injection SI joint intervention     I spent a total of 30 minutes reviewing chart data, face-to-face evaluation with the patient, counseling and coordination of care as detailed above.  Provider-requested follow-up: Return in about 3 months (around 04/02/2022) for Medication Management, in person. Recent Visits Date Type Provider Dept  12/17/21 Office Visit Gillis Santa, MD [facility]  Showing recent visits within past 90 days and meeting all other requirements Today's Visits Date Type Provider Dept  12/31/21 Office Visit Gillis Santa, MD [facility]  Showing today's visits and meeting all other requirements Future Appointments Date Type Provider Dept  03/27/22 Appointment Gillis Santa, MD [facility]  Showing future appointments  within next 90 days and meeting all other requirements  Primary Care Physician: McLean-Scocuzza, Nino Glow, MD Note by: Gillis Santa, MD Date: 12/31/2021; Time: 2:28 PM

## 2021-12-31 NOTE — Patient Instructions (Signed)
Sign pain contract.

## 2021-12-31 NOTE — Progress Notes (Signed)
Safety precautions to be maintained throughout the outpatient stay will include: orient to surroundings, keep bed in low position, maintain call bell within reach at all times, provide assistance with transfer out of bed and ambulation.  

## 2022-01-20 ENCOUNTER — Ambulatory Visit (INDEPENDENT_AMBULATORY_CARE_PROVIDER_SITE_OTHER): Payer: Medicare Other | Admitting: Family Medicine

## 2022-01-20 ENCOUNTER — Encounter: Payer: Self-pay | Admitting: Family Medicine

## 2022-01-20 VITALS — BP 115/70 | HR 90 | Temp 98.0°F | Ht 65.0 in | Wt 136.4 lb

## 2022-01-20 DIAGNOSIS — R200 Anesthesia of skin: Secondary | ICD-10-CM

## 2022-01-20 DIAGNOSIS — R202 Paresthesia of skin: Secondary | ICD-10-CM | POA: Diagnosis not present

## 2022-01-20 DIAGNOSIS — M8589 Other specified disorders of bone density and structure, multiple sites: Secondary | ICD-10-CM | POA: Diagnosis not present

## 2022-01-20 DIAGNOSIS — R7309 Other abnormal glucose: Secondary | ICD-10-CM

## 2022-01-20 DIAGNOSIS — Z1329 Encounter for screening for other suspected endocrine disorder: Secondary | ICD-10-CM | POA: Diagnosis not present

## 2022-01-20 DIAGNOSIS — R7989 Other specified abnormal findings of blood chemistry: Secondary | ICD-10-CM

## 2022-01-20 DIAGNOSIS — G894 Chronic pain syndrome: Secondary | ICD-10-CM

## 2022-01-20 DIAGNOSIS — I10 Essential (primary) hypertension: Secondary | ICD-10-CM

## 2022-01-20 DIAGNOSIS — J4520 Mild intermittent asthma, uncomplicated: Secondary | ICD-10-CM | POA: Diagnosis not present

## 2022-01-20 DIAGNOSIS — Z780 Asymptomatic menopausal state: Secondary | ICD-10-CM

## 2022-01-20 DIAGNOSIS — Z13220 Encounter for screening for lipoid disorders: Secondary | ICD-10-CM

## 2022-01-20 MED ORDER — TIZANIDINE HCL 4 MG PO TABS: 4.0000 mg | ORAL_TABLET | Freq: Four times a day (QID) | ORAL | 1 refills | Status: DC | PRN

## 2022-01-20 NOTE — Progress Notes (Addendum)
SUBJECTIVE:   Chief Complaint  Patient presents with   Transitions Of Care    Bone density referral   HPI Patient presents to clinic for transfer of care  No acute concerns today.  Would like referral to for bone density to have completed this year  Hypertension Asymptomatic.  Takes verapamil CR 240 mg at night and tolerating well.  Previously seen by Dr. Lennette Bihari at [facility].  Echo on 12/2015 showed grade 1 diastolic dysfunction.  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 Cherylann Ratel at pain management.  Currently takes Norco 5-325 mg 1 twice daily, gabapentin 600 mg twice daily, Flexeril 10 mg twice daily as needed.  Asthma Well-controlled.  Uses albuterol inhaler 4 times a week as needed  PERTINENT PMH / PSH: Hypertension Chronic pain syndrome Asthma  OBJECTIVE:  BP 115/70 (BP Location: Left Arm, Patient Position: Sitting, Cuff Size: Normal)   Pulse 90   Temp 98 F (36.7 C) (Oral)   Ht 5\' 5"  (1.651 m)   Wt 136 lb 6.4 oz (61.9 kg)   SpO2 96%   BMI 22.70 kg/m    Physical Exam Vitals reviewed.  Constitutional:      General: She is not in acute distress.    Appearance: She is not ill-appearing.  HENT:     Head: Normocephalic.     Nose: Nose normal.  Eyes:     Conjunctiva/sclera: Conjunctivae normal.  Cardiovascular:     Rate and Rhythm: Normal rate and regular rhythm.     Heart sounds: Normal heart sounds.  Pulmonary:     Effort: Pulmonary effort is normal.     Breath sounds: Normal breath sounds.  Abdominal:     General: Abdomen is flat. Bowel sounds are normal.     Palpations: Abdomen is soft.  Musculoskeletal:        General: Normal range of motion.     Cervical back: Normal range of motion.  Skin:    General: Skin is warm and dry.  Neurological:     Mental Status: She is alert and oriented to person, place, and time. Mental status is at baseline.  Psychiatric:        Mood and Affect: Mood  normal.        Behavior: Behavior normal.        Thought Content: Thought content normal.        Judgment: Judgment normal.     ASSESSMENT/PLAN:  Postmenopausal estrogen deficiency -     DG Bone Density; Future  Chronic pain syndrome Assessment & Plan: Followed by pain management for chronic radicular pain.  Has history of osteoarthritis, lumbar degenerative disc disease, spinal stenosis in the lumbar region. Recommend switching Flexeril given not recommended in advancing age.  Patient willing to try tizanidine.  If unable to tolerate tizanidine can trial baclofen. Discontinue Flexeril Trial low-dose tizanidine Continue gabapentin 600 mg twice daily Continue follow-up with pain management   Primary hypertension Assessment & Plan: Chronic.  Well-controlled.  Tolerating medication well. Continue verapamil CR to 240 mg nightly  Orders: -     Comprehensive metabolic panel; Future  Mild intermittent asthma without complication Assessment & Plan: Chronic.  Stable.  Previously had PFTs completed. Continue albuterol 1 to 2 puffs every 6 hours as needed for wheezing or shortness of breath.   Thyroid disorder screening -     TSH; Future  Numbness and tingling of both upper extremities -     CBC with Differential/Platelet;  Future  Lipid screening -     Lipid panel; Future  Abnormal glucose -     Hemoglobin A1c; Future  Low vitamin D level -     VITAMIN D 25 Hydroxy (Vit-D Deficiency, Fractures); Future  Other specified disorders of bone density and structure, multiple sites -     VITAMIN D 25 Hydroxy (Vit-D Deficiency, Fractures); Future   PDMP reviewed  Return in about 6 months (around 07/21/2022) for annual visit with fasting labs 1 week prior.  Dana Allan, MD

## 2022-01-20 NOTE — Patient Instructions (Addendum)
It was a pleasure meeting you today. Thank you for allowing me to take part in your health care.  Our goals for today as we discussed include:  Bone scan ordered. Please call to schedule appointment. [facility] [address] [place], [place] [address] [phone]   Call pharmacy for refills on Gabapentin  Stop Cyclobenzeprine Start Tizanidine 4 -8 mg three times a day as needed for muscle spasm   Please follow-up with PCP for labs in May 2024  If you have any questions or concerns, please do not hesitate to call the office at [phone].  I look forward to our next visit and until then take care and stay safe.  Regards,   Dana Allan, MD   [facility]

## 2022-01-30 ENCOUNTER — Telehealth: Payer: Self-pay | Admitting: Family Medicine

## 2022-01-30 NOTE — Telephone Encounter (Signed)
Patient called and wants Dr Clent Ridges to know that taking ZANidine (ZANAFLEX) 4 MG tablet is too high of a dose. She tried cutting in half and then the pill did not work. Could Dr Clent Ridges prescribe another medication.  Please call patient to let her know what Dr Clent Ridges is going to do, thank you.

## 2022-01-31 NOTE — Telephone Encounter (Signed)
Pt states that when she takes a half a pill it doesn't work. When one whole tablet, she feels like she is "high", makes her feel numb, she can't even do little task. It just "zonks her out"  Pt is aware that Dr. Clent Ridges is out of the office until Monday.   Pt uses [facility] on [address].

## 2022-01-31 NOTE — Telephone Encounter (Signed)
Noted. Patient should not take any more of the zanaflex. I will defer further treatment options to Dr Clent Ridges.

## 2022-01-31 NOTE — Telephone Encounter (Signed)
Patient states she is following-up with [place] regarding her previous message.  Patient states she would like to have a call back so she will know what to do.

## 2022-02-02 ENCOUNTER — Encounter: Payer: Self-pay | Admitting: Family Medicine

## 2022-02-02 DIAGNOSIS — Z13220 Encounter for screening for lipoid disorders: Secondary | ICD-10-CM | POA: Insufficient documentation

## 2022-02-02 DIAGNOSIS — R7309 Other abnormal glucose: Secondary | ICD-10-CM | POA: Insufficient documentation

## 2022-02-02 DIAGNOSIS — R7989 Other specified abnormal findings of blood chemistry: Secondary | ICD-10-CM | POA: Insufficient documentation

## 2022-02-02 NOTE — Assessment & Plan Note (Signed)
Chronic.  Stable.  Previously had PFTs completed. Continue albuterol 1 to 2 puffs every 6 hours as needed for wheezing or shortness of breath.

## 2022-02-02 NOTE — Assessment & Plan Note (Addendum)
Chronic.  Well-controlled.  Tolerating medication well. Continue verapamil CR to 240 mg nightly

## 2022-02-02 NOTE — Assessment & Plan Note (Signed)
Followed by pain management for chronic radicular pain.  Has history of osteoarthritis, lumbar degenerative disc disease, spinal stenosis in the lumbar region. Recommend switching Flexeril given not recommended in advancing age.  Patient willing to try tizanidine.  If unable to tolerate tizanidine can trial baclofen. Discontinue Flexeril Trial low-dose tizanidine Continue gabapentin 600 mg twice daily Continue follow-up with pain management

## 2022-02-03 ENCOUNTER — Telehealth: Payer: Self-pay | Admitting: Student in an Organized Health Care Education/Training Program

## 2022-02-03 NOTE — Telephone Encounter (Signed)
Called Patient with Dr. Purvis Sheffield message to stop the Zanaflex and the Patient is upset because she has talked to multiple people and still does not have a treatment plan. Patient wants to speak directly with Dr. Clent Ridges. Patient states she can stop the Zanaflex but she is still having muscle spasms so bad that the spasms stop her in her tracks.

## 2022-02-03 NOTE — Telephone Encounter (Signed)
PT stated that the hydrocodone 5-325 isn't helping with her pain.

## 2022-02-03 NOTE — Telephone Encounter (Signed)
Patient advised that she should come for appt to discuss with Dr. Cherylann Ratel other options. Spoke with secretaries, asked about sooner appts that the one scheduled on 03-27-21. The only one available is 03-25-22. Patient will keep the one she already has.

## 2022-02-04 ENCOUNTER — Encounter: Payer: Self-pay | Admitting: Family Medicine

## 2022-02-04 ENCOUNTER — Other Ambulatory Visit: Payer: Self-pay | Admitting: Family Medicine

## 2022-02-04 MED ORDER — BACLOFEN 10 MG PO TABS: 10.0000 mg | ORAL_TABLET | Freq: Three times a day (TID) | ORAL | 0 refills | Status: DC

## 2022-02-04 MED ORDER — BACLOFEN 10 MG PO TABS: 5.0000 mg | ORAL_TABLET | Freq: Three times a day (TID) | ORAL | 0 refills | Status: DC

## 2022-02-04 NOTE — Telephone Encounter (Signed)
Sent message to patient in MyChart with changes

## 2022-02-04 NOTE — Progress Notes (Signed)
Prescription sent for baclofen to try for muscle spasm.  Discontinued Tizanidine.    Dana Allan, MD

## 2022-03-18 ENCOUNTER — Ambulatory Visit
Admission: RE | Admit: 2022-03-18 | Discharge: 2022-03-18 | Disposition: A | Discharge status: Home / Self Care | Payer: Medicare Other | Source: Ambulatory Visit | Attending: Family Medicine | Admitting: Family Medicine

## 2022-03-18 DIAGNOSIS — Z780 Asymptomatic menopausal state: Secondary | ICD-10-CM | POA: Diagnosis not present

## 2022-03-18 DIAGNOSIS — M85852 Other specified disorders of bone density and structure, left thigh: Secondary | ICD-10-CM | POA: Diagnosis not present

## 2022-03-20 ENCOUNTER — Telehealth: Payer: Self-pay | Admitting: Family Medicine

## 2022-03-20 NOTE — Telephone Encounter (Signed)
Patient called and wanted to know if there is a prescription that is stronger then what she is taking for victim D and calcium, that is covered by Medicare.

## 2022-03-27 ENCOUNTER — Encounter: Payer: Self-pay | Admitting: Student in an Organized Health Care Education/Training Program

## 2022-03-27 ENCOUNTER — Ambulatory Visit
Payer: Medicare Other | Attending: Student in an Organized Health Care Education/Training Program | Admitting: Student in an Organized Health Care Education/Training Program

## 2022-03-27 VITALS — BP 124/81 | HR 90 | Temp 97.1°F | Resp 16 | Ht 65.0 in | Wt 130.0 lb

## 2022-03-27 DIAGNOSIS — M961 Postlaminectomy syndrome, not elsewhere classified: Secondary | ICD-10-CM

## 2022-03-27 DIAGNOSIS — G894 Chronic pain syndrome: Secondary | ICD-10-CM

## 2022-03-27 DIAGNOSIS — G8929 Other chronic pain: Secondary | ICD-10-CM

## 2022-03-27 DIAGNOSIS — M5136 Other intervertebral disc degeneration, lumbar region: Secondary | ICD-10-CM | POA: Diagnosis not present

## 2022-03-27 DIAGNOSIS — Z0289 Encounter for other administrative examinations: Secondary | ICD-10-CM | POA: Diagnosis not present

## 2022-03-27 DIAGNOSIS — M5416 Radiculopathy, lumbar region: Secondary | ICD-10-CM

## 2022-03-27 DIAGNOSIS — M48062 Spinal stenosis, lumbar region with neurogenic claudication: Secondary | ICD-10-CM

## 2022-03-27 DIAGNOSIS — M5134 Other intervertebral disc degeneration, thoracic region: Secondary | ICD-10-CM

## 2022-03-27 MED ORDER — HYDROCODONE-ACETAMINOPHEN 7.5-325 MG PO TABS: 1.0000 | ORAL_TABLET | Freq: Three times a day (TID) | ORAL | 0 refills | Status: AC | PRN

## 2022-03-27 NOTE — Progress Notes (Signed)
PROVIDER NOTE: Information contained herein reflects review and annotations entered in association with encounter. Interpretation of such information and data should be left to medically-trained personnel. Information provided to patient can be located elsewhere in the medical record under "Patient Instructions". Document created using STT-dictation technology, any transcriptional errors that may result from process are unintentional.    Patient: Amy Grant  Service Category: E/M  Provider: Edward Jolly, MD  DOB: [birth year]  DOS: 03/27/2022  Referring Provider: Quentin Ore *  MRN: [id number]  Specialty: Interventional Pain Management  PCP: Dana Allan, MD  Type: Established Patient  Setting: Ambulatory outpatient    Location: Office  Delivery: Face-to-face     HPI  [name], a 72 y.o. year old female, is here today because of her Chronic radicular lumbar pain [M54.16, G89.29]. [name] primary complain today is Back Pain (Lumbar bilateral, right is worse ) Last encounter: My last encounter with her was on 02/03/2022. Pertinent problems: [name] has Radiculopathy; Spinal stenosis, lumbar region, with neurogenic claudication; Lumbar degenerative disc disease; Chronic pain syndrome; and Pain management contract signed on their pertinent problem list. Pain Assessment: Severity of Chronic pain is reported as a 3 /10. Location: Back Lower, Left, Right/into both hips right side is worse.  also radiates around the abdomen. Onset: More than a month ago. Quality: Discomfort, Constant, Burning, Tender. Timing: Constant. Modifying factor(s): medications, topical analgesics, positioning. Vitals:  height is 5\' 5"  (1.651 m) and weight is 130 lb (59 kg). Her temporal temperature is 97.1 F (36.2 C) (abnormal). Her blood pressure is 124/81 and her pulse is 90. Her respiration is 16 and oxygen saturation is 100%.  BMI: Estimated body mass index is 21.63 kg/m as calculated from the  following:   Height as of this encounter: 5\' 5"  (1.651 m).   Weight as of this encounter: 130 lb (59 kg).  Reason for encounter: medication management.  She is also experiencing increased lumbar spine pain with radiation into bilateral legs  Increased lumbar spine pain and muscles spasms. Gets pain at night that makes it difficult for her to sleep. Supplementing with Vit D and Calcium based on results from bone density scan below She states that her low back pain also radiates into bilateral hips and bilateral legs in a dermatomal fashion with her right side being worse.  She has a history of postlaminectomy pain syndrome as well as failed back surgical syndrome.  Recommend repeat lumbar MRI with and without contrast since her previous 1 was in 2022.  We have discussed spinal cord stimulation as a potential therapeutic option for her however I would like to review her thoracic and lumbar MRI. She takes hydrocodone 5 mg daily as needed but states that it is not helping her out with her pain as much since her pain is worsened.  We discussed increasing that to 7.5 mg daily as needed.  HPI from initial clinic visit 12/17/2021: Amy Grant is a pleasant 72 year old female who presents with 2 primary complaints: Low back pain with radiation into bilateral legs status post lumbar spine surgery in 2019.  She also has a history of neck pain status post C-spine surgery.  She states that her primary care provider recently retired and she is being referred here for assistance with chronic pain management.  She has done physical therapy in the past with limited response.  She is on gabapentin 600 mg twice a day.  She takes hydrocodone 5 mg as needed very seldomly.  Her  last prescription was September 1 for a quantity of 20 and she still has tablets remaining.  She states that she only takes hydrocodone for severe breakthrough pain.  She is unable to tolerate NSAIDs given kidney dysfunction.  She has also been told by her  primary care provider to avoid acetaminophen at higher doses.    Pharmacotherapy Assessment  Analgesic: Increase hydrocodone from 5 mg daily as needed to 7.5 mg daily as needed  Monitoring: [place] PMP: PDMP not reviewed this encounter.       Pharmacotherapy: No side-effects or adverse reactions reported. Compliance: No problems identified. Effectiveness: Clinically acceptable.  Vernie Ammons, RN  03/27/2022  2:59 PM  Sign when Signing Visit Nursing Pain Medication Assessment:  Safety precautions to be maintained throughout the outpatient stay will include: orient to surroundings, keep bed in low position, maintain call bell within reach at all times, provide assistance with transfer out of bed and ambulation.  Medication Inspection Compliance: Ms. Mangels did not comply with our request to bring her pills to be counted. She was reminded that bringing the medication bottles, even when empty, is a requirement.  Medication: None brought in. Pill/Patch Count: None available to be counted. Bottle Appearance: No container available. Did not bring bottle(s) to appointment. Filled Date: N/A Last Medication intake:  Yesterday    No results found for: "CBDTHCR" No results found for: "D8THCCBX" No results found for: "D9THCCBX"  UDS:  No results found for: "SUMMARY"    ROS  Constitutional: Denies any fever or chills Gastrointestinal: No reported hemesis, hematochezia, vomiting, or acute GI distress Musculoskeletal:  Low back pain with radiation into bilateral lower extremity, right greater than left Neurological: No reported episodes of acute onset apraxia, aphasia, dysarthria, agnosia, amnesia, paralysis, loss of coordination, or loss of consciousness  Medication Review  Calcium Carbonate-Vitamin D, Calcium-Magnesium-Vitamin D, HYDROcodone-acetaminophen, Sod Fluoride-Potassium Nitrate, albuterol, gabapentin, multivitamin with minerals, and verapamil  History Review  Allergy: Ms. Enyeart is  allergic to latex, sulfa antibiotics, and linaclotide. Drug: Ms. Goldberger  reports no history of drug use. Alcohol:  reports current alcohol use of about 7.0 standard drinks of alcohol per week. Tobacco:  reports that she quit smoking about 11 years ago. Her smoking use included cigarettes. She has never used smokeless tobacco. Social: Ms. Zaccaro  reports that she quit smoking about 11 years ago. Her smoking use included cigarettes. She has never used smokeless tobacco. She reports current alcohol use of about 7.0 standard drinks of alcohol per week. She reports that she does not use drugs. Medical:  has a past medical history of Anxiety, Arthritis, Asthma, Blood in stool, Cervical radicular pain (11/16/2019), Chronic intermittent post-traumatic headache, Chronic neck pain with history of cervical spinal surgery (03/28/2017), Chronic radicular lumbar pain (08/07/2016), Colon polyps, Complication of anesthesia (2018), Depression, Failed back surgical syndrome (12/17/2021), Family history of adverse reaction to anesthesia, GERD (gastroesophageal reflux disease), Head injury, Headache, Hepatitis, Hepatitis C, History of blood transfusion, History of hepatitis C (03/27/2020), History of lumbar surgery (11/21/2019), Hypertension, Laryngopharyngeal reflux (LPR), Multiple falls, Oral lichen planus, Osteoarthritis (03/28/2017), Pancreatitis, Seizures (HCC), and Vitamin D deficiency. Surgical: Ms. Milliner  has a past surgical history that includes Hernia repair; Cesarean section; Exploratory laparotomy; Tonsillectomy; Appendectomy; Colon resection; Colonoscopy; Anterior cervical decomp/discectomy fusion (N/A, 08/07/2016); Breast biopsy (Left); Abdominal hysterectomy (1979); acdf; Transforaminal lumbar interbody fusion (tlif) with pedicle screw fixation 1 level (Right, 03/24/2018); and low back surgery. Family: family history includes Arthritis in her maternal grandmother and sister; Breast cancer (  age of onset: 1465) in her  sister; Cancer in her maternal grandfather, mother, and sister; Diabetes in her paternal grandmother and sister; Heart disease in her mother; Heart failure in her mother; Hyperlipidemia in her maternal grandmother; Hypertension in her mother; Learning disabilities in her maternal grandmother; Miscarriages / [place] in her daughter, mother, sister, and sister; Multiple sclerosis in her father; Other in her sister.  Laboratory Chemistry Profile   Renal Lab Results  Component Value Date   BUN 13 08/02/2021   CREATININE 1.11 (H) 08/02/2021   BCR 12 08/02/2021   GFR 46.84 (L) 07/11/2021   GFRAA >60 03/18/2018   GFRNONAA >60 03/18/2018    Hepatic Lab Results  Component Value Date   AST 21 07/11/2021   ALT 17 07/11/2021   ALBUMIN 4.3 07/11/2021   ALKPHOS 71 07/11/2021    Electrolytes Lab Results  Component Value Date   NA 141 08/02/2021   K 3.8 08/02/2021   CL 106 08/02/2021   CALCIUM 9.0 08/02/2021    Bone Lab Results  Component Value Date   VD25OH 66.24 12/31/2017    Inflammation (CRP: Acute Phase) (ESR: Chronic Phase) No results found for: "CRP", "ESRSEDRATE", "LATICACIDVEN"       Note: Above Lab results reviewed.  Recent Imaging Review  DG Bone Density EXAM: DUAL X-RAY ABSORPTIOMETRY (DXA) FOR BONE MINERAL DENSITY  IMPRESSION: Your patient Arnell AsalSharon Sabedra completed a BMD test on 03/18/2022 using the [employer] Advance DXA System (analysis version: 14.10) manufactured by [employer]. The following summarizes the results of our evaluation. Technologist: LCE PATIENT BIOGRAPHICAL: Name: Amy CalixClark, Arina L Patient ID: [id number] Birth Date: 22-Nov-1950 Height: 66.0 in. Gender: Female Exam Date: 03/18/2022 Weight: 129.2 lbs. Indications: Early Menopause, History of Fracture (Adult), Hysterectomy, POSTmenopausal Fractures: Knee Right Treatments: Calcium, Vitamin D  ASSESSMENT: The BMD measured at Femur Neck Left is 0.743 g/cm2 with a T-score of -2.1. This patient is  considered osteopenic according to World Health Organization [facility](WHO) criteria. L-3 and L-4 were excluded due to degenerative changes and surgical hardware. The scan quality is good.  Site Region Measured Measured WHO Young Adult BMD Date       Age      Classification T-score AP Spine L1-L2 5/18/2024 69.6 Normal -0.9 1.061 g/cm2  DualFemur Neck Left 03/18/2022 69.6 Osteopenia -2.1 0.743 g/cm2  World Health Organization [facility](WHO) criteria for post-menopausal, Caucasian Women: Normal:       T-score at or above -1 SD Osteopenia:   T-score between -1 and -2.5 SD Osteoporosis: T-score at or below -2.5 SD  RECOMMENDATIONS: 1. All patients should optimize calcium and vitamin D intake. 2. Consider FDA-approved medical therapies in postmenopausal women and men aged 250 years and older, based on the following: a. A hip or vertebral (clinical or morphometric) fracture b. T-score < -2.5 at the femoral neck or spine after appropriate evaluation to exclude secondary causes c. Low bone mass (T-score between -1.0 and -2.5 at the femoral neck or spine) and a 10-year probability of a hip fracture > 3% or a 10-year probability of a major osteoporosis-related fracture > 20% based on the [place]-adapted WHO algorithm d. Clinician judgment and/or patient preferences may indicate treatment for people with 10-year fracture probabilities above or below these levels FOLLOW-UP: People with diagnosed cases of osteoporosis or at high risk for fracture should have regular bone mineral density tests. For patients eligible for Medicare, routine testing is allowed once every 2 years. The testing frequency can be increased to one year for patients who  have rapidly progressing disease, those who are receiving or discontinuing medical therapy to restore bone mass, or have additional risk factors.  I have reviewed this report, and agree with the above findings.  [facility] Your patient Amy CalixSharon L Shively completed a  FRAX assessment on 03/18/2022 using the [employer] Advance DXA System (analysis version: 14.10) manufactured by [employer]. The following summarizes the results of our evaluation. Technologist: LCE PATIENT BIOGRAPHICAL: Name: Amy CalixClark, Jenae L Patient ID: [id number] Birth Date: [birth year] Height:    66.0 in. Gender:     Female    [age]       Weight:    129.2 lbs. Ethnicity:  Black                            Exam Date: 03/18/2022  FRAX* RESULTS:  (version: 3.5) 10-year Probability of Fracture1 Major Osteoporotic Fracture2 Hip Fracture 8.1% 1.7% Population: [place] ([place]) Risk Factors: History of Fracture (Adult)  Based on DualFemur (Left) Neck BMD  1 -The 10-year probability of fracture may be lower than reported if the patient has received treatment. 2 -Major Osteoporotic Fracture: Clinical Spine, Forearm, Hip or Shoulder  *FRAX is a Armed forces logistics/support/administrative offic[employer]rad[employer] of the [employer] Met[employer] Bone Disease, a World Science writerHealth Organization (WHO) [employer].  ASSESSMENT: The probability of a major osteoporotic fracture is 8.1% within the next ten years.  The probability of a hip fracture is 1.7% within the next ten years.  Electronically Signed   By: Bary RichardStan  Maynard M.D.   On: 03/18/2022 14:27  MR LUMBAR SPINE WO CONTRAST   Narrative CLINICAL DATA:  Chronic left lumbar radiculopathy; history of surgery   EXAM: MRI LUMBAR SPINE WITHOUT CONTRAST   TECHNIQUE: Multiplanar, multisequence MR imaging of the lumbar spine was performed. No intravenous contrast was administered.   COMPARISON:  12/02/2019   FINDINGS: Motion artifact is present.   Segmentation: Standard.   Alignment:  Similar anterolisthesis at L4-L5.   Vertebrae: Similar vertebral body heights with lower lumbar degenerative endplate irregularity. No substantial marrow edema. No suspicious osseous lesion. Postoperative changes are again identified at L4-L5 with  rods and pedicle screws and interbody spacer. Hardware is not well evaluated and there is associated susceptibility artifact.   Conus medullaris and cauda equina: Conus extends to the L1 level. Conus and cauda equina appear normal.   Paraspinal and other soft tissues: Unremarkable.   Disc levels:   L1-L2:  No canal or foraminal stenosis.   L2-L3: Mild facet arthropathy with ligamentum flavum infolding. No canal or foraminal stenosis.   L3-L4: Disc bulge. Moderate facet arthropathy with ligamentum flavum infolding. Minor canal and foraminal stenosis.   L4-L5: Operative level. Anterolisthesis with uncovering of possible residual disc eccentric to the right with endplate osteophytic ridging. Facet arthropathy with ligamentum flavum infolding. Mild canal stenosis. Moderate right foraminal stenosis. Minor left foraminal stenosis.   L5-S1: Disc bulge with endplate osteophytic ridging and probable superimposed new left foraminal extrusion. No canal stenosis. Facet arthropathy with ligamentum flavum infolding. No canal stenosis. Mild to moderate right and increased marked left foraminal stenosis.   IMPRESSION: Degenerative and operative changes as detailed above. There is a suspected new left foraminal disc herniation at L5-S1 with marked stenosis and exiting nerve root compression.     Electronically Signed By: Guadlupe SpanishPraneil  Patel M.D. On: 11/02/2020 16:02   Note: Reviewed  Physical Exam  General appearance: Well nourished, well developed, and well hydrated. In no apparent acute distress Mental status: Alert, oriented x 3 (person, place, & time)       Respiratory: No evidence of acute respiratory distress Eyes: PERLA Vitals: BP 124/81 (BP Location: Right Arm, Patient Position: Sitting, Cuff Size: Normal)   Pulse 90   Temp (!) 97.1 F (36.2 C) (Temporal)   Resp 16   Ht 5\' 5"  (1.651 m)   Wt 130 lb (59 kg)   SpO2 100%   BMI 21.63 kg/m  BMI: Estimated body mass index  is 21.63 kg/m as calculated from the following:   Height as of this encounter: 5\' 5"  (1.651 m).   Weight as of this encounter: 130 lb (59 kg). Ideal: Ideal body weight: 57 kg (125 lb 10.6 oz) Adjusted ideal body weight: 57.8 kg (127 lb 6.4 oz)  Lumbar Spine Area Exam  Skin & Axial Inspection: Well healed scar from previous spine surgery detected Alignment: Symmetrical Functional ROM: Pain restricted ROM affecting both sides Stability: No instability detected Muscle Tone/Strength: Functionally intact. No obvious neuro-muscular anomalies detected. Sensory (Neurological): Dermatomal pain pattern L4/5 Palpation: No palpable anomalies         Positive straight leg raise test bilaterally Ambulation: Unassisted Gait: Relatively normal for age and body habitus Posture: WNL    Lower Extremity Exam      Side: Right lower extremity   Side: Left lower extremity  Stability: No instability observed           Stability: No instability observed          Skin & Extremity Inspection: Skin color, temperature, and hair growth are WNL. No peripheral edema or cyanosis. No masses, redness, swelling, asymmetry, or associated skin lesions. No contractures.   Skin & Extremity Inspection: Skin color, temperature, and hair growth are WNL. No peripheral edema or cyanosis. No masses, redness, swelling, asymmetry, or associated skin lesions. No contractures.  Functional ROM: Unrestricted ROM                   Functional ROM: Unrestricted ROM                  Muscle Tone/Strength: Functionally intact. No obvious neuro-muscular anomalies detected.   Muscle Tone/Strength: Functionally intact. No obvious neuro-muscular anomalies detected.  Sensory (Neurological): Dermatomal pain pattern         Sensory (Neurological): Dermatomal pain pattern        DTR: Patellar: deferred today Achilles: deferred today Plantar: deferred today   DTR: Patellar: deferred today Achilles: deferred today Plantar: deferred today   Palpation: No palpable anomalies   Palpation: No palpable anomalies     Assessment   Diagnosis Status  1. Chronic radicular lumbar pain   2. Spinal stenosis, lumbar region, with neurogenic claudication   3. Lumbar degenerative disc disease   4. Post laminectomy syndrome   5. Pain management contract signed   6. Failed back surgical syndrome   7. Chronic pain syndrome   8. Other intervertebral disc degeneration, thoracic region    Persistent Persistent Persistent    Plan of Care    Ms. Elgie Congo has a current medication list which includes the following long-term medication(s): albuterol, calcium carbonate-vitamin d, calcium-magnesium-vitamin d, gabapentin, and verapamil.  Pharmacotherapy (Medications Ordered): Meds ordered this encounter  Medications   HYDROcodone-acetaminophen (NORCO) 7.5-325 MG tablet    Sig: Take 1 tablet by mouth every 8 (eight) hours as needed for severe  pain. Must last 30 days.    Dispense:  90 tablet    Refill:  0    Chronic Pain: STOP Act (Not applicable) Fill 1 day early if closed on refill date. Avoid benzodiazepines within 8 hours of opioids   Orders:  Orders Placed This Encounter  Procedures   MR [place]    Patient presents with axial pain with possible radicular component. Please assist [place] in identifying specific level(s) and laterality of any additional findings such as: 1. Facet (Zygapophyseal) joint DJD (Hypertrophy, space narrowing, subchondral sclerosis, and/or osteophyte formation) 2. DDD and/or IVDD (Loss of disc height, desiccation, gas patterns, osteophytes, endplate sclerosis, or "Black disc disease") 3. Pars defects 4. Spondylolisthesis, spondylosis, and/or spondyloarthropathies (include Degree/Grade of displacement in mm) (stability) 5. Vertebral body Fractures (acute/chronic) (state percentage of collapse) 6. Demineralization (osteopenia/osteoporotic) 7. Bone pathology 8. Foraminal narrowing  9.  Surgical changes 10. Central, Lateral Recess, and/or Foraminal Stenosis (include AP diameter of stenosis in mm) 11. Surgical changes (hardware type, status, and presence of fibrosis) 12. Modic Type Changes (MRI only) 13. IVDD (Disc bulge, protrusion, herniation, extrusion) (Level, laterality, extent)    Standing Status:   Future    Standing Expiration Date:   09/25/2022    Scheduling Instructions:     Please make sure that the patient understands that this needs to be done as soon as possible. Never have the patient do the imaging "just before the next appointment". Inform patient that having the imaging done within the [facility] will expedite the availability of the results and will provide      imaging availability to the requesting physician. In addition inform the patient that the imaging order has an expiration date and will not be renewed if not done within the active period.    Order Specific Question:   If indicated for the ordered procedure, I authorize the administration of contrast media per Radiology protocol    Answer:   Yes    Order Specific Question:   What is the patient's sedation requirement?    Answer:   No Sedation    Order Specific Question:   Does the patient have a pacemaker or implanted devices?    Answer:   No    Order Specific Question:   Call Results- Best Contact Number?    Answer:   [phone] ([facility])    Order Specific Question:   Radiology Contrast Protocol - do NOT remove file path    Answer:   [email or web address]    Order Specific Question:   Preferred imaging location?    Answer:   [facility] (table limit - 500 lbs)   MR THORACIC SPINE WO CONTRAST    Patient presents with axial pain with possible radicular component. Please assist [place] in identifying specific level(s) and laterality of any additional findings such as: 1. Facet (Zygapophyseal) joint DJD (Hypertrophy, space narrowing, subchondral sclerosis, and/or  osteophyte formation) 2. DDD and/or IVDD (Loss of disc height, desiccation, gas patterns, osteophytes, endplate sclerosis, or "Black disc disease") 3. Pars defects 4. Spondylolisthesis, spondylosis, and/or spondyloarthropathies (include Degree/Grade of displacement in mm) (stability) 5. Vertebral body Fractures (acute/chronic) (state percentage of collapse) 6. Demineralization (osteopenia/osteoporotic) 7. Bone pathology 8. Foraminal narrowing  9. Surgical changes 10. Central, Lateral Recess, and/or Foraminal Stenosis (include AP diameter of stenosis in mm) 11. Surgical changes (hardware type, status, and presence of fibrosis) 12. Modic Type Changes (MRI only) 13. IVDD (Disc bulge, protrusion, herniation, extrusion) (Level, laterality,  extent)    Standing Status:   Future    Standing Expiration Date:   04/27/2022    Scheduling Instructions:     Please make sure that the patient understands that this needs to be done as soon as possible. Never have the patient do the imaging "just before the next appointment". Inform patient that having the imaging done within the [facility] Network will expedite the availability of the results and will provide      imaging availability to the requesting physician. In addition inform the patient that the imaging order has an expiration date and will not be renewed if not done within the active period.    Order Specific Question:   What is the patient's sedation requirement?    Answer:   No Sedation    Order Specific Question:   Does the patient have a pacemaker or implanted devices?    Answer:   No    Order Specific Question:   Preferred imaging location?    Answer:   [facility] (table limit-350lbs)    Order Specific Question:   Call Results- Best Contact Number?    Answer:   [phone] [facility])    Order Specific Question:   Radiology Contrast Protocol - do NOT remove file path    Answer:   [email or web address]    Follow-up plan:   Return in about 4 weeks (around 04/24/2022) for review L-MRI.    Recent Visits Date Type Provider Dept  12/31/21 Office Visit Edward Jolly, MD [facility]  Showing recent visits within past 90 days and meeting all other requirements Today's Visits Date Type Provider Dept  03/27/22 Office Visit Edward Jolly, MD [facility]  Showing today's visits and meeting all other requirements Future Appointments No visits were found meeting these conditions. Showing future appointments within next 90 days and meeting all other requirements  I discussed the assessment and treatment plan with the patient. The patient was provided an opportunity to ask questions and all were answered. The patient agreed with the plan and demonstrated an understanding of the instructions.  Patient advised to call back or seek an in-person evaluation if the symptoms or condition worsens.  Duration of encounter: .  Total time on encounter, as per AMA guidelines included both the face-to-face and non-face-to-face time personally spent by the physician and/or other qualified health care professional(s) on the day of the encounter (includes time in activities that require the physician or other qualified health care professional and does not include time in activities normally performed by clinical staff). Physician's time may include the following activities when performed: Preparing to see the patient (e.g., pre-charting review of records, searching for previously ordered imaging, lab work, and nerve conduction tests) Review of prior analgesic pharmacotherapies. Reviewing PMP Interpreting ordered tests (e.g., lab work, imaging, nerve conduction tests) Performing post-procedure evaluations, including interpretation of diagnostic procedures Obtaining and/or reviewing separately obtained history Performing a medically appropriate examination and/or evaluation Counseling and  educating the patient/family/caregiver Ordering medications, tests, or procedures Referring and communicating with other health care professionals (when not separately reported) Documenting clinical information in the electronic or other health record Independently interpreting results (not separately reported) and communicating results to the patient/ family/caregiver Care coordination (not separately reported)  Note by: Edward Jolly, MD Date: 03/27/2022; Time: 3:25 PM

## 2022-03-27 NOTE — Progress Notes (Signed)
Nursing Pain Medication Assessment:  Safety precautions to be maintained throughout the outpatient stay will include: orient to surroundings, keep bed in low position, maintain call bell within reach at all times, provide assistance with transfer out of bed and ambulation.  Medication Inspection Compliance: Ms. Bellino did not comply with our request to bring her pills to be counted. She was reminded that bringing the medication bottles, even when empty, is a requirement.  Medication: None brought in. Pill/Patch Count: None available to be counted. Bottle Appearance: No container available. Did not bring bottle(s) to appointment. Filled Date: N/A Last Medication intake:  Yesterday 

## 2022-04-15 ENCOUNTER — Ambulatory Visit
Admission: RE | Admit: 2022-04-15 | Discharge: 2022-04-15 | Disposition: A | Discharge status: Home / Self Care | Payer: Medicare Other | Source: Ambulatory Visit | Attending: Student in an Organized Health Care Education/Training Program | Admitting: Student in an Organized Health Care Education/Training Program

## 2022-04-15 DIAGNOSIS — M961 Postlaminectomy syndrome, not elsewhere classified: Secondary | ICD-10-CM

## 2022-04-15 DIAGNOSIS — M5134 Other intervertebral disc degeneration, thoracic region: Secondary | ICD-10-CM

## 2022-04-15 DIAGNOSIS — M48062 Spinal stenosis, lumbar region with neurogenic claudication: Secondary | ICD-10-CM

## 2022-04-15 DIAGNOSIS — G8929 Other chronic pain: Secondary | ICD-10-CM | POA: Diagnosis not present

## 2022-04-15 DIAGNOSIS — G894 Chronic pain syndrome: Secondary | ICD-10-CM

## 2022-04-15 DIAGNOSIS — M5136 Other intervertebral disc degeneration, lumbar region: Secondary | ICD-10-CM | POA: Diagnosis not present

## 2022-04-15 DIAGNOSIS — M5416 Radiculopathy, lumbar region: Secondary | ICD-10-CM | POA: Diagnosis not present

## 2022-04-15 DIAGNOSIS — M47814 Spondylosis without myelopathy or radiculopathy, thoracic region: Secondary | ICD-10-CM | POA: Diagnosis not present

## 2022-04-15 DIAGNOSIS — M549 Dorsalgia, unspecified: Secondary | ICD-10-CM | POA: Diagnosis not present

## 2022-04-15 MED ORDER — GADOPICLENOL 0.5 MMOL/ML IV SOLN
6.0000 mL | Freq: Once | INTRAVENOUS | Status: AC | PRN
  Administered 2022-04-15: 6 mL via INTRAVENOUS

## 2022-04-21 ENCOUNTER — Ambulatory Visit
Payer: Medicare Other | Attending: Student in an Organized Health Care Education/Training Program | Admitting: Student in an Organized Health Care Education/Training Program

## 2022-04-21 ENCOUNTER — Encounter: Payer: Self-pay | Admitting: Student in an Organized Health Care Education/Training Program

## 2022-04-21 VITALS — BP 110/79 | HR 81 | Temp 96.8°F | Resp 18 | Ht 65.0 in | Wt 125.0 lb

## 2022-04-21 DIAGNOSIS — M961 Postlaminectomy syndrome, not elsewhere classified: Secondary | ICD-10-CM

## 2022-04-21 DIAGNOSIS — M5416 Radiculopathy, lumbar region: Secondary | ICD-10-CM | POA: Diagnosis not present

## 2022-04-21 DIAGNOSIS — G894 Chronic pain syndrome: Secondary | ICD-10-CM | POA: Diagnosis not present

## 2022-04-21 DIAGNOSIS — G8929 Other chronic pain: Secondary | ICD-10-CM | POA: Insufficient documentation

## 2022-04-21 DIAGNOSIS — M48062 Spinal stenosis, lumbar region with neurogenic claudication: Secondary | ICD-10-CM | POA: Diagnosis not present

## 2022-04-21 NOTE — Progress Notes (Signed)
PROVIDER NOTE: Information contained herein reflects review and annotations entered in association with encounter. Interpretation of such information and data should be left to medically-trained personnel. Information provided to patient can be located elsewhere in the medical record under "Patient Instructions". Document created using STT-dictation technology, any transcriptional errors that may result from process are unintentional.    Patient: Amy Grant  Service Category: E/M  Provider: Gillis Santa, MD  DOB: March 05, 1950  DOS: 04/21/2022  Referring Provider: Carollee Leitz, MD  MRN: [id number]  Specialty: Interventional Pain Management  PCP: Carollee Leitz, MD  Type: Established Patient  Setting: Ambulatory outpatient    Location: Office  Delivery: Face-to-face     HPI  [name], a 72 y.o. year old female, is here today because of her Chronic radicular lumbar pain [M54.16, G89.29]. Ms. Rester primary complain today is Back Pain Last encounter: My last encounter with her was on 03/27/2022. Pertinent problems: Ms. Croley has Radiculopathy; Spinal stenosis, lumbar region, with neurogenic claudication; Lumbar degenerative disc disease; Chronic pain syndrome; and Pain management contract signed on their pertinent problem list. Pain Assessment: Severity of Chronic pain is reported as a 2 /10. Location: Back Lower, Right, Left/radaited into both hips right side is worse. also radiates around the abdomen. From lower back does in front of upper leg and behind lower legs. Onset: More than a month ago. Quality: Constant, Discomfort, Burning, Tender. Timing: Constant. Modifying factor(s): medications (musle relaxants, hydrocodone), topical analgesics, positioning. Vitals:  height is 5' 5"$  (1.651 m) and weight is 125 lb (56.7 kg). Her temporal temperature is 96.8 F (36 C) (abnormal). Her blood pressure is 110/79 and her pulse is 81. Her respiration is 18 and oxygen saturation is 99%.  BMI: Estimated body  mass index is 20.8 kg/m as calculated from the following:   Height as of this encounter: 5' 5"$  (1.651 m).   Weight as of this encounter: 125 lb (56.7 kg).  Reason for encounter: follow-up evaluation.  Patient continues to endorse persistent low back pain with radiation into bilateral legs in a dermatomal fashion with the right side usually being worse.  She has a history of L4-L5 lumbar spinal fusion.  She has completed her thoracic and lumbar MRI which I reviewed with her in detail today.  We also discussed alternative pain management strategies.  I provided her with information regarding therabody low back massager and how to obtain that.  I informed her that if she would like to pursue spinal cord stimulator trial, she will need a psych evaluation and I provided her with a [employer] brochure with information for psychological clearance for spinal cord stimulators.  She is also requesting a handicap placard and I informed her that I can help her out with that as long as she provided [place] with a form.  Patient endorsed understanding.   ROS  Constitutional: Denies any fever or chills Gastrointestinal: No reported hemesis, hematochezia, vomiting, or acute GI distress Musculoskeletal:  Low back pain with radiation into bilateral legs, right greater than left Neurological: No reported episodes of acute onset apraxia, aphasia, dysarthria, agnosia, amnesia, paralysis, loss of coordination, or loss of consciousness  Medication Review  Calcium Carbonate-Vitamin D, Calcium-Magnesium-Vitamin D, HYDROcodone-acetaminophen, Sod Fluoride-Potassium Nitrate, albuterol, baclofen, gabapentin, multivitamin with minerals, tiZANidine, and verapamil  History Review  Allergy: Ms. Oros is allergic to latex, sulfa antibiotics, and linaclotide. Drug: Ms. Salcido  reports no history of drug use. Alcohol:  reports current alcohol use of about 7.0 standard drinks of alcohol  per week. Tobacco:  reports that she quit smoking  about 11 years ago. Her smoking use included cigarettes. She has never used smokeless tobacco. Social: Ms. Zelasko  reports that she quit smoking about 11 years ago. Her smoking use included cigarettes. She has never used smokeless tobacco. She reports current alcohol use of about 7.0 standard drinks of alcohol per week. She reports that she does not use drugs. 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 Surgical: Ms. Nalley  has a past surgical history that includes Hernia repair; Cesarean section; Exploratory laparotomy; Tonsillectomy; Appendectomy; Colon resection; Colonoscopy; Anterior cervical decomp/discectomy fusion (N/A, 08/07/2016); Breast biopsy (Left); Abdominal hysterectomy (1979); acdf; Transforaminal lumbar interbody fusion (tlif) with pedicle screw fixation 1 level (Right, 03/24/2018); and low back surgery. Family: family history includes Arthritis in her maternal grandmother and sister; Breast cancer (age of onset: 20) in her sister; Cancer in her maternal grandfather, mother, and sister; Diabetes in her paternal grandmother and sister; Heart disease in her mother; Heart failure in her mother; Hyperlipidemia in her maternal grandmother; Hypertension in her  mother; Learning disabilities in her maternal grandmother; Miscarriages / [place] in her daughter, mother, sister, and sister; Multiple sclerosis in her father; Other in her sister.  Laboratory Chemistry Profile   Renal Lab Results  Component Value Date   BUN 13 08/02/2021   CREATININE 1.11 (H) 08/02/2021   BCR 12 08/02/2021   GFR 46.84 (L) 07/11/2021   GFRAA >60 03/18/2018   GFRNONAA >60 03/18/2018    Hepatic Lab Results  Component Value Date   AST 21 07/11/2021   ALT 17 07/11/2021   ALBUMIN 4.3 07/11/2021   ALKPHOS 71 07/11/2021    Electrolytes Lab Results  Component Value Date   NA 141 08/02/2021   K 3.8 08/02/2021   CL 106 08/02/2021   CALCIUM 9.0 08/02/2021    Bone Lab Results  Component Value Date   VD25OH 66.24 12/31/2017    Inflammation (CRP: Acute Phase) (ESR: Chronic Phase) No results found for: "CRP", "ESRSEDRATE", "LATICACIDVEN"       Note: Above Lab results reviewed.  Recent Imaging Review  MR LUMBAR SPINE W WO CONTRAST CLINICAL DATA:  Lumbar radiculopathy. Chronic mid and lower back pain radiating to the hips and legs, right greater than left.  EXAM: MRI LUMBAR SPINE WITHOUT AND WITH CONTRAST  TECHNIQUE: Multiplanar and multiecho pulse sequences of the lumbar spine were obtained without and with intravenous contrast.  CONTRAST:  6 mL V[employer]way  COMPARISON:  Lumbar spine MRI 11/01/2020  FINDINGS: Segmentation:  Standard.  Alignment:  Unchanged 5 mm anterolisthesis of L4 on L5.  Vertebrae: No fracture, suspicious marrow lesion, or evidence of discitis. Prior posterior and interbody fusion at L4-5 with persistent mild degenerative endplate edema at this level.  Conus medullaris and cauda equina: Conus extends to the L1-2 level. Conus and cauda equina appear normal.  Paraspinal and other soft tissues: Postoperative changes in the posterior lower lumbar soft tissues. No fluid collection.  Disc levels:  T12-L1: Mild facet hypertrophy  without disc herniation or stenosis, unchanged.  L1-2: Mild facet hypertrophy without disc herniation or stenosis, unchanged.  L2-3: Minimal disc bulging and moderate facet and ligamentum flavum hypertrophy without stenosis, unchanged.  L3-4: Disc bulging and severe facet and ligamentum flavum hypertrophy result in borderline spinal and neural foraminal stenosis, unchanged.  L4-5: Prior fusion. Anterolisthesis with bulging uncovered disc and facet and ligamentum flavum hypertrophy result in mild spinal stenosis and moderate right and mild left neural foraminal stenosis, unchanged.  L5-S1: Severe disc space narrowing. Disc bulging, endplate spurring, a chronic left foraminal disc extrusion, and moderate facet hypertrophy result in mild-to-moderate right and moderate to severe left neural foraminal stenosis without spinal stenosis. The disc extrusion appears smaller than on the prior study with mildly improved left foraminal stenosis, although there is still potential left L5 nerve root impingement.  IMPRESSION: 1. Chronic left foraminal disc extrusion at L5-S1 with slight improvement of moderate to severe foraminal stenosis. 2. Unchanged disc and facet degeneration elsewhere. Mild spinal stenosis and moderate right and mild left neural foraminal stenosis at the fused L4-5 level.  Electronically Signed   By: Logan Bores M.D.   On: 04/16/2022 08:10 MR THORACIC SPINE WO CONTRAST CLINICAL DATA:  Osteoarthritis, thoracic. Chronic mid and lower back pain radiating to the hips and legs, right greater than left.  EXAM: MRI THORACIC SPINE WITHOUT CONTRAST  TECHNIQUE: Multiplanar, multisequence MR imaging of the thoracic spine was performed. No intravenous contrast was administered.  COMPARISON:  Cervical spine MRI 12/02/2019  FINDINGS: Alignment:  Normal.  Vertebrae: No fracture, suspicious marrow lesion, or evidence of discitis. Limited assessment of the cervical spine on  large field-of-view sagittal images again demonstrates prior C4-5 ACDF and advanced disc degeneration from C5-6 through C7-T1 as shown on the prior cervical spine MRI.  Cord:  Normal signal and morphology.  Paraspinal and other soft tissues: Unremarkable.  Disc levels:  At C7-T1, disc bulging, uncovertebral spurring, and facet arthrosis result in moderate right and mild left neural foraminal stenosis, similar to the prior cervical spine MRI. No spinal stenosis.  There is minor disc bulging at T1-2 and T2-3 without stenosis. There is multilevel facet arthrosis which is moderate at T3-4 and T11-12. No significant neural foraminal stenosis.  IMPRESSION: 1. Mild thoracic spondylosis and mild-to-moderate facet arthrosis without spinal stenosis. 2. Moderate right and mild left neural foraminal stenosis at C7-T1. 3.  Electronically Signed   By: Logan Bores M.D.   On: 04/16/2022 07:54 Note: Reviewed        Physical Exam  General appearance: Well nourished, well developed, and well hydrated. In no apparent acute distress Mental status: Alert, oriented x 3 (person, place, & time)       Respiratory: No evidence of acute respiratory distress Eyes: PERLA Vitals: BP 110/79   Pulse 81   Temp (!) 96.8 F (36 C) (Temporal)   Resp 18   Ht 5' 5"$  (1.651 m)   Wt 125 lb (56.7 kg)   SpO2 99%   BMI 20.80 kg/m  BMI: Estimated body mass index is 20.8 kg/m as calculated from the following:   Height as of this encounter: 5' 5"$  (1.651 m).   Weight as of this encounter: 125 lb (56.7 kg). Ideal: Ideal body weight: 57 kg (125 lb 10.6 oz)  Lumbar Spine Area Exam  Skin & Axial Inspection: Well healed scar from previous spine surgery detected Alignment: Symmetrical Functional ROM: Pain restricted ROM affecting both sides Stability:  No instability detected Muscle Tone/Strength: Functionally intact. No obvious neuro-muscular anomalies detected. Sensory (Neurological): Dermatomal pain pattern  L4/5 Palpation: No palpable anomalies         Positive straight leg raise test bilaterally Ambulation: Unassisted Gait: Relatively normal for age and body habitus Posture: WNL    Lower Extremity Exam      Side: Right lower extremity   Side: Left lower extremity  Stability: No instability observed           Stability: No instability observed          Skin & Extremity Inspection: Skin color, temperature, and hair growth are WNL. No peripheral edema or cyanosis. No masses, redness, swelling, asymmetry, or associated skin lesions. No contractures.   Skin & Extremity Inspection: Skin color, temperature, and hair growth are WNL. No peripheral edema or cyanosis. No masses, redness, swelling, asymmetry, or associated skin lesions. No contractures.  Functional ROM: Unrestricted ROM                   Functional ROM: Unrestricted ROM                  Muscle Tone/Strength: Functionally intact. No obvious neuro-muscular anomalies detected.   Muscle Tone/Strength: Functionally intact. No obvious neuro-muscular anomalies detected.  Sensory (Neurological): Dermatomal pain pattern         Sensory (Neurological): Dermatomal pain pattern        DTR: Patellar: deferred today Achilles: deferred today Plantar: deferred today   DTR: Patellar: deferred today Achilles: deferred today Plantar: deferred today  Palpation: No palpable anomalies   Palpation: No palpable anomalies     Assessment   Diagnosis Status  1. Chronic radicular lumbar pain   2. Spinal stenosis, lumbar region, with neurogenic claudication   3. Post laminectomy syndrome   4. Failed back surgical syndrome   5. Chronic pain syndrome    Controlled Controlled Controlled   Updated Problems: No problems updated.  Plan of Care  Reviewed thoracic and lumbar MRI with patient in detail.  I also discussed spinal cord stimulator trial with her.  Informed patient that she will need a psych eval and provided her with instructions of how to  proceed with that if she would like to move forward with SCS trial.  Once she has, she can contact our clinic and I will place order for spinal cord stimulator trial.  Continue with med management as prescribed  Encourage patient to consider lumbar spine brace with built-in massager and heating cooling functions.  Patient welcome to bring in handicap application for handicap placard and I can assist with that.   Follow-up plan:   Return if symptoms worsen or fail to improve.     Recent Visits Date Type Provider Dept  03/27/22 Office Visit Gillis Santa, MD [facility]  Showing recent visits within past 90 days and meeting all other requirements Today's Visits Date Type Provider Dept  04/21/22 Office Visit Gillis Santa, MD [facility]  Showing today's visits and meeting all other requirements Future Appointments No visits were found meeting these conditions. Showing future appointments within next 90 days and meeting all other requirements  I discussed the assessment and treatment plan with the patient. The patient was provided an opportunity to ask questions and all were answered. The patient agreed with the plan and demonstrated an understanding of the instructions.  Patient advised to call back or seek an in-person evaluation if the symptoms or condition worsens.  Duration of  encounter: 46mnutes.  Total time on encounter, as per AMA guidelines included both the face-to-face and non-face-to-face time personally spent by the physician and/or other qualified health care professional(s) on the day of the encounter (includes time in activities that require the physician or other qualified health care professional and does not include time in activities normally performed by clinical staff). Physician's time may include the following activities when performed: Preparing to see the patient (e.g., pre-charting review of records, searching for previously ordered imaging,  lab work, and nerve conduction tests) Review of prior analgesic pharmacotherapies. Reviewing PMP Interpreting ordered tests (e.g., lab work, imaging, nerve conduction tests) Performing post-procedure evaluations, including interpretation of diagnostic procedures Obtaining and/or reviewing separately obtained history Performing a medically appropriate examination and/or evaluation Counseling and educating the patient/family/caregiver Ordering medications, tests, or procedures Referring and communicating with other health care professionals (when not separately reported) Documenting clinical information in the electronic or other health record Independently interpreting results (not separately reported) and communicating results to the patient/ family/caregiver Care coordination (not separately reported)  Note by: BGillis Santa MD Date: 04/21/2022; Time: 3:05 PM

## 2022-04-21 NOTE — Progress Notes (Signed)
Safety precautions to be maintained throughout the outpatient stay will include: orient to surroundings, keep bed in low position, maintain call bell within reach at all times, provide assistance with transfer out of bed and ambulation.     Nursing Pain Medication Assessment:  Safety precautions to be maintained throughout the outpatient stay will include: orient to surroundings, keep bed in low position, maintain call bell within reach at all times, provide assistance with transfer out of bed and ambulation.  Medication Inspection Compliance: Pill count conducted under aseptic conditions, in front of the patient. Neither the pills nor the bottle was removed from the patient's sight at any time. Once count was completed pills were immediately returned to the patient in their original bottle.  Medication: Hydrocodone/APAP Pill/Patch Count:  67 of 90 pills remain Pill/Patch Appearance: Markings consistent with prescribed medication Bottle Appearance: Standard pharmacy container. Clearly labeled. Filled Date: 01 / 25 / 2024 Last Medication intake:   Last took 04/19/22

## 2022-06-17 ENCOUNTER — Ambulatory Visit
Payer: Medicare Other | Attending: Student in an Organized Health Care Education/Training Program | Admitting: Student in an Organized Health Care Education/Training Program

## 2022-06-17 ENCOUNTER — Encounter: Payer: Self-pay | Admitting: Student in an Organized Health Care Education/Training Program

## 2022-06-17 VITALS — BP 115/67 | HR 72 | Temp 97.4°F | Resp 16 | Ht 65.0 in | Wt 124.0 lb

## 2022-06-17 DIAGNOSIS — M961 Postlaminectomy syndrome, not elsewhere classified: Secondary | ICD-10-CM | POA: Insufficient documentation

## 2022-06-17 DIAGNOSIS — M5416 Radiculopathy, lumbar region: Secondary | ICD-10-CM | POA: Diagnosis not present

## 2022-06-17 DIAGNOSIS — M5136 Other intervertebral disc degeneration, lumbar region: Secondary | ICD-10-CM | POA: Insufficient documentation

## 2022-06-17 DIAGNOSIS — G8929 Other chronic pain: Secondary | ICD-10-CM | POA: Insufficient documentation

## 2022-06-17 DIAGNOSIS — G894 Chronic pain syndrome: Secondary | ICD-10-CM | POA: Diagnosis not present

## 2022-06-17 DIAGNOSIS — M48062 Spinal stenosis, lumbar region with neurogenic claudication: Secondary | ICD-10-CM | POA: Diagnosis not present

## 2022-06-17 MED ORDER — HYDROCODONE-ACETAMINOPHEN 7.5-325 MG PO TABS: 1.0000 | ORAL_TABLET | Freq: Three times a day (TID) | ORAL | 0 refills | Status: AC | PRN

## 2022-06-17 NOTE — Progress Notes (Signed)
PROVIDER NOTE: Information contained herein reflects review and annotations entered in association with encounter. Interpretation of such information and data should be left to medically-trained personnel. Information provided to patient can be located elsewhere in the medical record under "Patient Instructions". Document created using STT-dictation technology, any transcriptional errors that may result from process are unintentional.    Patient: Amy Grant  Service Category: E/M  Provider: Edward Jolly, MD  DOB: [birth year]  DOS: 06/17/2022  Referring Provider: Dana Allan, MD  MRN: [id number]  Specialty: Interventional Pain Management  PCP: Dana Allan, MD  Type: Established Patient  Setting: Ambulatory outpatient    Location: Office  Delivery: Face-to-face     HPI  Amy Grant, a 72 y.o. year old female, is here today because of Amy Grant Chronic radicular lumbar pain [M54.16, G89.29]. Ms. Mcglothen primary complain today is Back Pain (Lumbar bilateral ), Neck Pain (Left side ), and Hand Pain (Left ) Last encounter: My last encounter with Amy Grant was on 04/21/22 Pertinent problems: Ms. Woolston has Radiculopathy; Spinal stenosis, lumbar region, with neurogenic claudication; Lumbar degenerative disc disease; Chronic pain syndrome; and Pain management contract signed on their pertinent problem list. Pain Assessment: Severity of Chronic pain is reported as a 5 /10. Location: Back Lower, Left, Right/into both hips right is worse.  back pain radiates around to the abdomen and also goes into legs.. Onset: More than a month ago. Quality: Discomfort, Constant, Burning, Tender, Spasm. Timing: Constant. Modifying factor(s): medications, topicals and positioning. Vitals:  height is 5\' 5"  (1.651 m) and weight is 124 lb (56.2 kg). Amy Grant temporal temperature is 97.4 F (36.3 C) (abnormal). Amy Grant blood pressure is 115/67 and Amy Grant pulse is 72. Amy Grant respiration is 16 and oxygen saturation is 100%.  BMI: Estimated body mass  index is 20.63 kg/m as calculated from the following:   Height as of this encounter: 5\' 5"  (1.651 m).   Weight as of this encounter: 124 lb (56.2 kg).  Reason for encounter: medication management. Also new problems.  -Having increased left hand and thumb pain. Discussed heat, voltaren gel application, and possible bracing. DDX includes carpal tunnel syndrome, CMC arthritis. -Continues to endorse persistent low back pain with radiation into bilateral legs in a dermatomal fashion with the right side usually being worse.   -She has a history of L4-L5 lumbar spinal fusion.  -She has completed Amy Grant thoracic and lumbar MRI which we have reviewed in the past, have discussed spinal cord stimulation.   ROS  Constitutional: Denies any fever or chills Gastrointestinal: No reported hemesis, hematochezia, vomiting, or acute GI distress Musculoskeletal:  left wrist pain, Low back pain with radiation into bilateral legs, right greater than left Neurological: No reported episodes of acute onset apraxia, aphasia, dysarthria, agnosia, amnesia, paralysis, loss of coordination, or loss of consciousness  Medication Review  Calcium Carbonate-Vitamin D, Calcium-Magnesium-Vitamin D, HYDROcodone-acetaminophen, Sod Fluoride-Potassium Nitrate, albuterol, baclofen, gabapentin, multivitamin with minerals, and verapamil  History Review  Allergy: Ms. Dixson is allergic to latex, sulfa antibiotics, and linaclotide. Drug: Ms. Fiumara  reports no history of drug use. Alcohol:  reports current alcohol use of about 7.0 standard drinks of alcohol per week. Tobacco:  reports that she quit smoking about 11 years ago. Amy Grant smoking use included cigarettes. She has never used smokeless tobacco. Social: Ms. Syler  reports that she quit smoking about 11 years ago. Amy Grant smoking use included cigarettes. She has never used smokeless tobacco. She reports current alcohol use of about 7.0 standard drinks of alcohol  per week. She reports that  she does not use drugs. Medical:  has a past medical history of Anxiety, Arthritis, Asthma, Blood in stool, Cervical radicular pain (11/16/2019), Chronic intermittent post-traumatic headache, Chronic neck pain with history of cervical spinal surgery (03/28/2017), Chronic radicular lumbar pain (08/07/2016), Colon polyps, Complication of anesthesia (2018), Depression, Failed back surgical syndrome (12/17/2021), Family history of adverse reaction to anesthesia, GERD (gastroesophageal reflux disease), Head injury, Headache, Hepatitis, Hepatitis C, History of blood transfusion, History of hepatitis C (03/27/2020), History of lumbar surgery (11/21/2019), Hypertension, Laryngopharyngeal reflux (LPR), Multiple falls, Oral lichen planus, Osteoarthritis (03/28/2017), Pancreatitis, Seizures, and Vitamin D deficiency. Surgical: Ms. Swailes  has a past surgical history that includes Hernia repair; Cesarean section; Exploratory laparotomy; Tonsillectomy; Appendectomy; Colon resection; Colonoscopy; Anterior cervical decomp/discectomy fusion (N/A, 08/07/2016); Breast biopsy (Left); Abdominal hysterectomy (1979); acdf; Transforaminal lumbar interbody fusion (tlif) with pedicle screw fixation 1 level (Right, 03/24/2018); and low back surgery. Family: family history includes Arthritis in Amy Grant maternal grandmother and sister; Breast cancer (age of onset: 88) in Amy Grant sister; Cancer in Amy Grant maternal grandfather, mother, and sister; Diabetes in Amy Grant paternal grandmother and sister; Heart disease in Amy Grant mother; Heart failure in Amy Grant mother; Hyperlipidemia in Amy Grant maternal grandmother; Hypertension in Amy Grant mother; Learning disabilities in Amy Grant maternal grandmother; Miscarriages / [place] in Amy Grant daughter, mother, sister, and sister; Multiple sclerosis in Amy Grant father; Other in Amy Grant sister.  Laboratory Chemistry Profile   Renal Lab Results  Component Value Date   BUN 13 08/02/2021   CREATININE 1.11 (H) 08/02/2021   BCR 12 08/02/2021   GFR  46.84 (L) 07/11/2021   GFRAA >60 03/18/2018   GFRNONAA >60 03/18/2018    Hepatic Lab Results  Component Value Date   AST 21 07/11/2021   ALT 17 07/11/2021   ALBUMIN 4.3 07/11/2021   ALKPHOS 71 07/11/2021    Electrolytes Lab Results  Component Value Date   NA 141 08/02/2021   K 3.8 08/02/2021   CL 106 08/02/2021   CALCIUM 9.0 08/02/2021    Bone Lab Results  Component Value Date   VD25OH 66.24 12/31/2017    Inflammation (CRP: Acute Phase) (ESR: Chronic Phase) No results found for: "CRP", "ESRSEDRATE", "LATICACIDVEN"       Note: Above Lab results reviewed.  Recent Imaging Review  MR LUMBAR SPINE W WO CONTRAST CLINICAL DATA:  Lumbar radiculopathy. Chronic mid and lower back pain radiating to the hips and legs, right greater than left.  EXAM: MRI LUMBAR SPINE WITHOUT AND WITH CONTRAST  TECHNIQUE: Multiplanar and multiecho pulse sequences of the lumbar spine were obtained without and with intravenous contrast.  CONTRAST:  6 mL Vueway  COMPARISON:  Lumbar spine MRI 11/01/2020  FINDINGS: Segmentation:  Standard.  Alignment:  Unchanged 5 mm anterolisthesis of L4 on L5.  Vertebrae: No fracture, suspicious marrow lesion, or evidence of discitis. Prior posterior and interbody fusion at L4-5 with persistent mild degenerative endplate edema at this level.  Conus medullaris and cauda equina: Conus extends to the L1-2 level. Conus and cauda equina appear normal.  Paraspinal and other soft tissues: Postoperative changes in the posterior lower lumbar soft tissues. No fluid collection.  Disc levels:  T12-L1: Mild facet hypertrophy without disc herniation or stenosis, unchanged.  L1-2: Mild facet hypertrophy without disc herniation or stenosis, unchanged.  L2-3: Minimal disc bulging and moderate facet and ligamentum flavum hypertrophy without stenosis, unchanged.  L3-4: Disc bulging and severe facet and ligamentum flavum hypertrophy result in borderline spinal and  neural foraminal stenosis, unchanged.  L4-5: Prior fusion. Anterolisthesis with bulging uncovered disc and facet and ligamentum flavum hypertrophy result in mild spinal stenosis and moderate right and mild left neural foraminal stenosis, unchanged.  L5-S1: Severe disc space narrowing. Disc bulging, endplate spurring, a chronic left foraminal disc extrusion, and moderate facet hypertrophy result in mild-to-moderate right and moderate to severe left neural foraminal stenosis without spinal stenosis. The disc extrusion appears smaller than on the prior study with mildly improved left foraminal stenosis, although there is still potential left L5 nerve root impingement.  IMPRESSION: 1. Chronic left foraminal disc extrusion at L5-S1 with slight improvement of moderate to severe foraminal stenosis. 2. Unchanged disc and facet degeneration elsewhere. Mild spinal stenosis and moderate right and mild left neural foraminal stenosis at the fused L4-5 level.  Electronically Signed   By: Sebastian Ache M.D.   On: 04/16/2022 08:10 MR THORACIC SPINE WO CONTRAST CLINICAL DATA:  Osteoarthritis, thoracic. Chronic mid and lower back pain radiating to the hips and legs, right greater than left.  EXAM: MRI THORACIC SPINE WITHOUT CONTRAST  TECHNIQUE: Multiplanar, multisequence MR imaging of the thoracic spine was performed. No intravenous contrast was administered.  COMPARISON:  Cervical spine MRI 12/02/2019  FINDINGS: Alignment:  Normal.  Vertebrae: No fracture, suspicious marrow lesion, or evidence of discitis. Limited assessment of the cervical spine on large field-of-view sagittal images again demonstrates prior C4-5 ACDF and advanced disc degeneration from C5-6 through C7-T1 as shown on the prior cervical spine MRI.  Cord:  Normal signal and morphology.  Paraspinal and other soft tissues: Unremarkable.  Disc levels:  At C7-T1, disc bulging, uncovertebral spurring, and facet  arthrosis result in moderate right and mild left neural foraminal stenosis, similar to the prior cervical spine MRI. No spinal stenosis.  There is minor disc bulging at T1-2 and T2-3 without stenosis. There is multilevel facet arthrosis which is moderate at T3-4 and T11-12. No significant neural foraminal stenosis.  IMPRESSION: 1. Mild thoracic spondylosis and mild-to-moderate facet arthrosis without spinal stenosis. 2. Moderate right and mild left neural foraminal stenosis at C7-T1. 3.  Electronically Signed   By: Sebastian Ache M.D.   On: 04/16/2022 07:54 Note: Reviewed        Physical Exam  General appearance: Well nourished, well developed, and well hydrated. In no apparent acute distress Mental status: Alert, oriented x 3 (person, place, & time)       Respiratory: No evidence of acute respiratory distress Eyes: PERLA Vitals: BP 115/67 (BP Location: Right Arm, Patient Position: Sitting, Cuff Size: Normal)   Pulse 72   Temp (!) 97.4 F (36.3 C) (Temporal)   Resp 16   Ht  (1.651 m)   Wt 124 lb (56.2 kg)   SpO2 100%   BMI 20.63 kg/m  BMI: Estimated body mass index is 20.63 kg/m as calculated from the following:   Height as of this encounter:  (1.651 m).   Weight as of this encounter: 124 lb (56.2 kg). Ideal: Ideal body weight: 57 kg (125 lb 10.6 oz)  Left wrist pain and left thumb pain  Lumbar Spine Area Exam  Skin & Axial Inspection: Well healed scar from previous spine surgery detected Alignment: Symmetrical Functional ROM: Pain restricted ROM affecting both sides Stability: No instability detected Muscle Tone/Strength: Functionally intact. No obvious neuro-muscular anomalies detected. Sensory (Neurological): Dermatomal pain pattern L4/5 Palpation: No palpable anomalies         Positive straight leg raise test bilaterally Ambulation: Unassisted Gait: Relatively normal for age  and body habitus Posture: WNL    Lower Extremity Exam      Side: Right  lower extremity   Side: Left lower extremity  Stability: No instability observed           Stability: No instability observed          Skin & Extremity Inspection: Skin color, temperature, and hair growth are WNL. No peripheral edema or cyanosis. No masses, redness, swelling, asymmetry, or associated skin lesions. No contractures.   Skin & Extremity Inspection: Skin color, temperature, and hair growth are WNL. No peripheral edema or cyanosis. No masses, redness, swelling, asymmetry, or associated skin lesions. No contractures.  Functional ROM: Unrestricted ROM                   Functional ROM: Unrestricted ROM                  Muscle Tone/Strength: Functionally intact. No obvious neuro-muscular anomalies detected.   Muscle Tone/Strength: Functionally intact. No obvious neuro-muscular anomalies detected.  Sensory (Neurological): Dermatomal pain pattern         Sensory (Neurological): Dermatomal pain pattern        DTR: Patellar: deferred today Achilles: deferred today Plantar: deferred today   DTR: Patellar: deferred today Achilles: deferred today Plantar: deferred today  Palpation: No palpable anomalies   Palpation: No palpable anomalies     Assessment   Diagnosis Status  1. Chronic radicular lumbar pain   2. Spinal stenosis, lumbar region, with neurogenic claudication   3. Post laminectomy syndrome   4. Failed back surgical syndrome   5. Lumbar degenerative disc disease   6. Chronic pain syndrome     Controlled Controlled Controlled     Plan of Care   1. Chronic radicular lumbar pain - HYDROcodone-acetaminophen (NORCO) 7.5-325 MG tablet; Take 1 tablet by mouth every 8 (eight) hours as needed for moderate pain.  Dispense: 90 tablet; Refill: 0  2. Spinal stenosis, lumbar region, with neurogenic claudication - HYDROcodone-acetaminophen (NORCO) 7.5-325 MG tablet; Take 1 tablet by mouth every 8 (eight) hours as needed for moderate pain.  Dispense: 90 tablet; Refill: 0  3. Post  laminectomy syndrome - HYDROcodone-acetaminophen (NORCO) 7.5-325 MG tablet; Take 1 tablet by mouth every 8 (eight) hours as needed for moderate pain.  Dispense: 90 tablet; Refill: 0  4. Failed back surgical syndrome - HYDROcodone-acetaminophen (NORCO) 7.5-325 MG tablet; Take 1 tablet by mouth every 8 (eight) hours as needed for moderate pain.  Dispense: 90 tablet; Refill: 0  5. Lumbar degenerative disc disease - HYDROcodone-acetaminophen (NORCO) 7.5-325 MG tablet; Take 1 tablet by mouth every 8 (eight) hours as needed for moderate pain.  Dispense: 90 tablet; Refill: 0  6. Chronic pain syndrome - HYDROcodone-acetaminophen (NORCO) 7.5-325 MG tablet; Take 1 tablet by mouth every 8 (eight) hours as needed for moderate pain.  Dispense: 90 tablet; Refill: 0  Discontinue Tizanidine due to cognitive side effects  Continue Gabapentin as prescribed Continue Baclofen prn   Follow-up plan:   Return in about 3 months (around 09/16/2022) for Medication Management, in person.     Recent Visits Date Type Provider Dept  04/21/22 Office Visit Edward Jolly, MD [facility]  03/27/22 Office Visit Edward Jolly, MD [facility]  Showing recent visits within past 90 days and meeting all other requirements Today's Visits Date Type Provider Dept  06/17/22 Office Visit Edward Jolly, MD [facility]  Showing today's visits and meeting all other requirements Future Appointments  No visits were found meeting these conditions. Showing future appointments within next 90 days and meeting all other requirements  I discussed the assessment and treatment plan with the patient. The patient was provided an opportunity to ask questions and all were answered. The patient agreed with the plan and demonstrated an understanding of the instructions.  Patient advised to call back or seek an in-person evaluation if the symptoms or condition worsens.  Duration of encounter: .  Total  time on encounter, as per AMA guidelines included both the face-to-face and non-face-to-face time personally spent by the physician and/or other qualified health care professional(s) on the day of the encounter (includes time in activities that require the physician or other qualified health care professional and does not include time in activities normally performed by clinical staff). Physician's time may include the following activities when performed: Preparing to see the patient (e.g., pre-charting review of records, searching for previously ordered imaging, lab work, and nerve conduction tests) Review of prior analgesic pharmacotherapies. Reviewing PMP Interpreting ordered tests (e.g., lab work, imaging, nerve conduction tests) Performing post-procedure evaluations, including interpretation of diagnostic procedures Obtaining and/or reviewing separately obtained history Performing a medically appropriate examination and/or evaluation Counseling and educating the patient/family/caregiver Ordering medications, tests, or procedures Referring and communicating with other health care professionals (when not separately reported) Documenting clinical information in the electronic or other health record Independently interpreting results (not separately reported) and communicating results to the patient/ family/caregiver Care coordination (not separately reported)  Note by: Edward Jolly, MD Date: 06/17/2022; Time: 8:39 AM

## 2022-06-17 NOTE — Progress Notes (Signed)
Nursing Pain Medication Assessment:  Safety precautions to be maintained throughout the outpatient stay will include: orient to surroundings, keep bed in low position, maintain call bell within reach at all times, provide assistance with transfer out of bed and ambulation.  Medication Inspection Compliance: Pill count conducted under aseptic conditions, in front of the patient. Neither the pills nor the bottle was removed from the patient's sight at any time. Once count was completed pills were immediately returned to the patient in their original bottle.  Medication: Hydrocodone/APAP Pill/Patch Count:  7 of 90 pills remain Pill/Patch Appearance: Markings consistent with prescribed medication Bottle Appearance: Standard pharmacy container. Clearly labeled. Filled Date: 01 / 25 / 2024 Last Medication intake:  Yesterday

## 2022-06-30 ENCOUNTER — Telehealth: Payer: Self-pay | Admitting: Family Medicine

## 2022-06-30 NOTE — Telephone Encounter (Signed)
Patient has routine labs scheduled. Will start with those and see Dr Clent Ridges on 5/20 after her labs. Will discuss then.

## 2022-06-30 NOTE — Telephone Encounter (Signed)
Pt called stating she has lost 20 pounds not trying and she want to see if she can get some extra blood work done when she come in for her next lab appointment

## 2022-07-14 ENCOUNTER — Telehealth: Payer: Self-pay | Admitting: *Deleted

## 2022-07-14 ENCOUNTER — Other Ambulatory Visit: Payer: Self-pay

## 2022-07-14 ENCOUNTER — Other Ambulatory Visit (INDEPENDENT_AMBULATORY_CARE_PROVIDER_SITE_OTHER): Payer: Medicare Other

## 2022-07-14 DIAGNOSIS — R200 Anesthesia of skin: Secondary | ICD-10-CM

## 2022-07-14 DIAGNOSIS — M8589 Other specified disorders of bone density and structure, multiple sites: Secondary | ICD-10-CM | POA: Diagnosis not present

## 2022-07-14 DIAGNOSIS — Z13220 Encounter for screening for lipoid disorders: Secondary | ICD-10-CM

## 2022-07-14 DIAGNOSIS — I10 Essential (primary) hypertension: Secondary | ICD-10-CM

## 2022-07-14 DIAGNOSIS — R202 Paresthesia of skin: Secondary | ICD-10-CM | POA: Diagnosis not present

## 2022-07-14 DIAGNOSIS — R7989 Other specified abnormal findings of blood chemistry: Secondary | ICD-10-CM

## 2022-07-14 DIAGNOSIS — Z1329 Encounter for screening for other suspected endocrine disorder: Secondary | ICD-10-CM

## 2022-07-14 DIAGNOSIS — R7309 Other abnormal glucose: Secondary | ICD-10-CM

## 2022-07-14 LAB — CBC WITH DIFFERENTIAL/PLATELET
Basophils Absolute: 0.1 10*3/uL (ref 0.0–0.1)
Basophils Relative: 1.1 % (ref 0.0–3.0)
Eosinophils Absolute: 0.2 10*3/uL (ref 0.0–0.7)
Eosinophils Relative: 3.3 % (ref 0.0–5.0)
HCT: 39.8 % (ref 36.0–46.0)
Hemoglobin: 13.6 g/dL (ref 12.0–15.0)
Lymphocytes Relative: 33.4 % (ref 12.0–46.0)
Lymphs Abs: 1.9 10*3/uL (ref 0.7–4.0)
MCHC: 34.1 g/dL (ref 30.0–36.0)
MCV: 91.2 fl (ref 78.0–100.0)
Monocytes Absolute: 0.6 10*3/uL (ref 0.1–1.0)
Monocytes Relative: 10 % (ref 3.0–12.0)
Neutro Abs: 3 10*3/uL (ref 1.4–7.7)
Neutrophils Relative %: 52.2 % (ref 43.0–77.0)
Platelets: 198 10*3/uL (ref 150.0–400.0)
RBC: 4.37 Mil/uL (ref 3.87–5.11)
RDW: 13.6 % (ref 11.5–15.5)
WBC: 5.7 10*3/uL (ref 4.0–10.5)

## 2022-07-14 LAB — TSH: TSH: 1.38 u[IU]/mL (ref 0.35–5.50)

## 2022-07-14 LAB — COMPREHENSIVE METABOLIC PANEL
ALT: 16 U/L (ref 0–35)
AST: 22 U/L (ref 0–37)
Albumin: 4.4 g/dL (ref 3.5–5.2)
Alkaline Phosphatase: 65 U/L (ref 39–117)
BUN: 10 mg/dL (ref 6–23)
CO2: 28 mEq/L (ref 19–32)
Calcium: 9.5 mg/dL (ref 8.4–10.5)
Chloride: 103 mEq/L (ref 96–112)
Creatinine, Ser: 1.04 mg/dL (ref 0.40–1.20)
GFR: 54.67 mL/min — ABNORMAL LOW (ref >60.00)
Glucose, Bld: 90 mg/dL (ref 70–99)
Potassium: 3.9 mEq/L (ref 3.5–5.1)
Sodium: 142 mEq/L (ref 135–145)
Total Bilirubin: 1.1 mg/dL (ref 0.2–1.2)
Total Protein: 7 g/dL (ref 6.0–8.3)

## 2022-07-14 LAB — HEMOGLOBIN A1C: Hgb A1c MFr Bld: 5.3 % (ref 4.6–6.5)

## 2022-07-14 LAB — VITAMIN D 25 HYDROXY (VIT D DEFICIENCY, FRACTURES): VITD: 119.53 ng/mL (ref 30.00–100.00)

## 2022-07-14 LAB — LIPID PANEL
Cholesterol: 188 mg/dL (ref 0–200)
HDL: 81.2 mg/dL (ref >39.00)
LDL Cholesterol: 93 mg/dL (ref 0–99)
NonHDL: 106.31
Total CHOL/HDL Ratio: 2
Triglycerides: 67 mg/dL (ref 0.0–149.0)
VLDL: 13.4 mg/dL (ref 0.0–40.0)

## 2022-07-14 NOTE — Telephone Encounter (Signed)
CRITICAL VALUE STICKER  CRITICAL VALUE: Vit D-119.53  RECEIVER (on-site recipient of call): Silvestre Moment, CMA  DATE & TIME NOTIFIED: 07/14/22 @ 2:42pm  MESSENGER (representative from lab): Tacey Ruiz  MD NOTIFIED: Dr. Clent Ridges  TIME OF NOTIFICATION:2:45pm  RESPONSE:

## 2022-07-14 NOTE — Telephone Encounter (Signed)
Spoke with patient and she states she has been taking  Calcium-Magnesium-Vitamin D (CALCIUM 1200+D3 PO) 2 tabs QD AND Calcium Carbonate-Vitamin D (CALTRATE 600+D PO)  2 tabs QHS  She has purchased both of these OTC. Patient advised to reduce to 1 tab QD of the D3, pending Dr. Claris Che recommendations.  And likely recheck Vit D labs at [facility].  Reviewed remainder of labs with patient. No further questions or concerns.   FYI - thank you!

## 2022-07-14 NOTE — Telephone Encounter (Signed)
Agree with recommendations.  

## 2022-07-20 DIAGNOSIS — E673 Hypervitaminosis D: Secondary | ICD-10-CM | POA: Insufficient documentation

## 2022-07-20 NOTE — Progress Notes (Deleted)
   SUBJECTIVE:  No chief complaint on file.  HPI ***  PERTINENT PMH / PSH: ***  OBJECTIVE:  There were no vitals taken for this visit.   Physical Exam  ASSESSMENT/PLAN:  Colon cancer screening  Breast cancer screening by mammogram  Hypervitaminosis D   PDMP reviewed***  No follow-ups on file.  Dana Allan, MD

## 2022-07-20 NOTE — Assessment & Plan Note (Deleted)
Taking Vitamin D supplements x 2

## 2022-07-21 ENCOUNTER — Encounter: Payer: Medicare Other | Admitting: Family Medicine

## 2022-07-21 DIAGNOSIS — Z1231 Encounter for screening mammogram for malignant neoplasm of breast: Secondary | ICD-10-CM

## 2022-07-21 DIAGNOSIS — E673 Hypervitaminosis D: Secondary | ICD-10-CM

## 2022-07-21 DIAGNOSIS — Z1211 Encounter for screening for malignant neoplasm of colon: Secondary | ICD-10-CM

## 2022-07-30 ENCOUNTER — Encounter: Payer: Self-pay | Admitting: Family Medicine

## 2022-07-30 ENCOUNTER — Ambulatory Visit (INDEPENDENT_AMBULATORY_CARE_PROVIDER_SITE_OTHER): Payer: Medicare Other | Admitting: Family Medicine

## 2022-07-30 VITALS — BP 108/68 | HR 81 | Ht 65.0 in | Wt 118.0 lb

## 2022-07-30 DIAGNOSIS — B370 Candidal stomatitis: Secondary | ICD-10-CM

## 2022-07-30 DIAGNOSIS — R634 Abnormal weight loss: Secondary | ICD-10-CM | POA: Diagnosis not present

## 2022-07-30 DIAGNOSIS — I10 Essential (primary) hypertension: Secondary | ICD-10-CM

## 2022-07-30 DIAGNOSIS — Z0000 Encounter for general adult medical examination without abnormal findings: Secondary | ICD-10-CM

## 2022-07-30 DIAGNOSIS — Z1231 Encounter for screening mammogram for malignant neoplasm of breast: Secondary | ICD-10-CM

## 2022-07-30 MED ORDER — NYSTATIN 100000 UNIT/ML MT SUSP
5.0000 mL | Freq: Four times a day (QID) | OROMUCOSAL | 0 refills | Status: DC
Start: 2022-07-30 — End: 2022-08-11

## 2022-07-30 NOTE — Patient Instructions (Signed)
It was a pleasure meeting you today. Thank you for allowing me to take part in your health care.  Our goals for today as we discussed include:  Start Nystatin solution 4 times a day Send picture of mouth  Record everything you eat and drink daily for the next couple of weeks  Follow up in 2 weeks  If you have any questions or concerns, please do not hesitate to call the office at [phone].  I look forward to our next visit and until then take care and stay safe.  Regards,   Dana Allan, MD   [facility]

## 2022-07-30 NOTE — Progress Notes (Addendum)
SUBJECTIVE:   Chief Complaint  Patient presents with   Annual Exam    HPI Patient presents to clinic for annual physical  Concern today for weight loss Unintentional weight loss of 22 pounds in 3 months.  Denies any fevers, difficulty swallowing, decrease in appetite, abdominal pain, diarrhea, blood in stool.  Endorses appetite is good.  Hydrates well.  She reports waking up morning with thick sticky mucus on her tongue and mouth that she has to scrape off. She endorses eating large amounts of food however she does not normally eat breakfast, and endorses eating only once daily.  She reports colonoscopy in 2015 at [facility] and repeat due in 10 years.  Hypertension Asymptomatic.  Denies any chest pain, shortness of breath, heart palpitations, headaches, visual changes, dizziness or weakness.  No lower extremity edema. Tyler rating verapamil CR 240 mg daily  Review of Systems - Negative except weight loss and oral thrush    PERTINENT PMH / PSH: Hyper attention Chronic constipation   OBJECTIVE:  BP 108/68   Pulse 81   Ht 5\' 5"  (1.651 m)   Wt 118 lb (53.5 kg)   SpO2 98%   BMI 19.64 kg/m    Physical Exam Vitals reviewed.  Constitutional:      General: She is not in acute distress.    Appearance: Normal appearance. She is normal weight. She is not ill-appearing, toxic-appearing or diaphoretic.  HENT:     Head: Normocephalic.     Right Ear: Tympanic membrane, ear canal and external ear normal.     Left Ear: Tympanic membrane, ear canal and external ear normal.     Mouth/Throat:     Mouth: Mucous membranes are moist.     Dentition: Has dentures. No gingival swelling, dental caries, dental abscesses or gum lesions.     Tongue: No lesions. Tongue does not deviate from midline.     Palate: No lesions.     Pharynx: No oropharyngeal exudate or posterior oropharyngeal erythema.     Tonsils: No tonsillar exudate or tonsillar abscesses.  Eyes:     General:        Right eye: No  discharge.        Left eye: No discharge.     Conjunctiva/sclera: Conjunctivae normal.  Cardiovascular:     Rate and Rhythm: Normal rate and regular rhythm.     Heart sounds: Normal heart sounds.  Pulmonary:     Effort: Pulmonary effort is normal.     Breath sounds: Normal breath sounds.  Abdominal:     General: Bowel sounds are normal.  Musculoskeletal:        General: Normal range of motion.  Skin:    General: Skin is warm and dry.  Neurological:     General: No focal deficit present.     Mental Status: She is alert and oriented to person, place, and time. Mental status is at baseline.  Psychiatric:        Mood and Affect: Mood normal.        Behavior: Behavior normal.        Thought Content: Thought content normal.        Judgment: Judgment normal.     ASSESSMENT/PLAN:  Weight loss Assessment & Plan: Documented weight shows 7 pound weight loss since February 2024. Patient eats 1 meal a day No red flags.  Recent blood work reassuring. Last colonoscopy 2015, unable to review results in epic or Care Everywhere.  Was completed at [facility]. Noted  history of thrush in a.m. although exam today benign Will treat with nystatin Recommend she journal food and fluid intake for 1 to 2 weeks and follow-up for review. If continued weight loss at that time will refer to GI.   Primary hypertension Assessment & Plan: Chronic.  Well-controlled.   Continue verapamil CR to 240 mg nightly   Thrush Assessment & Plan: Exam benign today. Will treat with nystatin swish and swallow 4 times daily x 5 days Follow-up in 2 weeks. Patient plans to send pictures via MyChart   Breast cancer screening by mammogram -     3D Screening Mammogram, Left and Right; Future  Annual physical exam Assessment & Plan: HCM Recommend tetanus booster Colonoscopy 2015 at [facility].  Request results.  Due 2025 Medicare annual wellness due.  Patient to schedule appointment Mammogram up-to-date.  Due 09/24.   Referral sent to [facility] breast imaging DEXA completed.  Osteopenic.  Currently on calcium and vitamin D supplements. Recommend pneumonia 20 vaccine Recommend shingles vaccine Hepatitis C screening completed     PDMP reviewed  Return in about 2 weeks (around 08/13/2022) for PCP.  Dana Allan, MD

## 2022-07-31 ENCOUNTER — Encounter: Payer: Self-pay | Admitting: Family Medicine

## 2022-08-01 ENCOUNTER — Telehealth: Payer: Self-pay | Admitting: Family Medicine

## 2022-08-01 NOTE — Telephone Encounter (Signed)
Copied from CRM [id number]. Topic: Medicare AWV >> Aug 01, 2022 10:32 AM Payton Doughty wrote: Reason for CRM: LM 08/01/2022 to schedule AWV   Verlee Rossetti; Care Guide Ambulatory Clinical Support [facility] [facility] Direct Dial: [phone]

## 2022-08-03 DIAGNOSIS — Z1231 Encounter for screening mammogram for malignant neoplasm of breast: Secondary | ICD-10-CM | POA: Insufficient documentation

## 2022-08-03 DIAGNOSIS — R634 Abnormal weight loss: Secondary | ICD-10-CM | POA: Insufficient documentation

## 2022-08-03 DIAGNOSIS — B370 Candidal stomatitis: Secondary | ICD-10-CM | POA: Insufficient documentation

## 2022-08-03 NOTE — Assessment & Plan Note (Addendum)
Exam benign today. Will treat with nystatin swish and swallow 4 times daily x 5 days Follow-up in 2 weeks. Patient plans to send pictures via MyChart

## 2022-08-03 NOTE — Assessment & Plan Note (Addendum)
Chronic.  Well-controlled.   Continue verapamil CR to 240 mg nightly

## 2022-08-03 NOTE — Assessment & Plan Note (Addendum)
Documented weight shows 7 pound weight loss since February 2024. Patient eats 1 meal a day No red flags.  Recent blood work reassuring. Last colonoscopy 2015, unable to review results in epic or Care Everywhere.  Was completed at [facility]. Noted history of thrush in a.m. although exam today benign Will treat with nystatin Recommend she journal food and fluid intake for 1 to 2 weeks and follow-up for review. If continued weight loss at that time will refer to GI.

## 2022-08-11 ENCOUNTER — Other Ambulatory Visit: Payer: Self-pay | Admitting: Family Medicine

## 2022-08-11 DIAGNOSIS — B370 Candidal stomatitis: Secondary | ICD-10-CM

## 2022-08-11 MED ORDER — NYSTATIN 100000 UNIT/ML MT SUSP: 5.0000 mL | Freq: Four times a day (QID) | OROMUCOSAL | 0 refills | Status: DC

## 2022-08-20 ENCOUNTER — Ambulatory Visit (INDEPENDENT_AMBULATORY_CARE_PROVIDER_SITE_OTHER): Payer: Medicare Other | Admitting: Family Medicine

## 2022-08-20 ENCOUNTER — Encounter: Payer: Self-pay | Admitting: Family Medicine

## 2022-08-20 VITALS — BP 104/68 | HR 80 | Temp 98.0°F | Resp 16 | Ht 65.0 in | Wt 119.0 lb

## 2022-08-20 DIAGNOSIS — R634 Abnormal weight loss: Secondary | ICD-10-CM | POA: Diagnosis not present

## 2022-08-20 DIAGNOSIS — B370 Candidal stomatitis: Secondary | ICD-10-CM | POA: Diagnosis not present

## 2022-08-20 NOTE — Patient Instructions (Signed)
It was a pleasure meeting you today. Thank you for allowing me to take part in your health care.  Our goals for today as we discussed include:  Follow up as needed   If you have any questions or concerns, please do not hesitate to call the office at [phone].  I look forward to our next visit and until then take care and stay safe.  Regards,   Ryshawn Sanzone, MD   [place]  

## 2022-08-20 NOTE — Progress Notes (Signed)
   SUBJECTIVE:   Chief Complaint  Patient presents with   Medical Management of Chronic Issues    HPI Patient presents to clinic for follow up thrush and weight loss  Weight loss Endorses weight has increased 1 lbs since last week.  Has added Ensure to meals.  Reviewed meal journal.  Lacking protein with each meal. Caloric intake mostly from fluids and and low nutrition content foods  Thrush Resolved with nystatin.  Denies any difficulty or pain with swallowing.     PERTINENT PMH / PSH: Hypertension Chronic constipation   OBJECTIVE:  BP 104/68   Pulse 80   Temp 98 F (36.7 C)   Resp 16   Ht 5\' 5"  (1.651 m)   Wt 119 lb (54 kg)   SpO2 98%   BMI 19.80 kg/m    Physical Exam HENT:     Mouth/Throat:     Lips: No lesions.     Mouth: Mucous membranes are moist.     Dentition: Has dentures. No dental caries or dental abscesses.     Tongue: No lesions.     Palate: No lesions.     Pharynx: Oropharynx is clear. No oropharyngeal exudate or posterior oropharyngeal erythema.     Tonsils: No tonsillar exudate.  Cardiovascular:     Rate and Rhythm: Normal rate.  Pulmonary:     Effort: Pulmonary effort is normal.     ASSESSMENT/PLAN:  Thrush Assessment & Plan: Exam benign today. Resolved with use of Nystatin Recommend she discuss options of cleaning dentures during her upcoming visit with denturist    Weight loss Assessment & Plan: Weight increased since last visit. No red flags. Appetite good and hydrating well Reviewed recent meal journal and notable for low calorie intake.   Increase caloric intake Increase protein with each meal Follow up if no improvement in symptoms    HCM Recommend tetanus booster Colonoscopy 2015 at [facility].  Request results.  Due 2025 Medicare annual wellness due.  Patient to schedule appointment Mammogram up-to-date.  Due 09/24.  Referral sent to [facility] breast imaging DEXA completed.  Osteopenic.  Currently on calcium and vitamin D  supplements. Recommend pneumonia 20 vaccine Recommend shingles vaccine Hepatitis C screening completed  PDMP reviewed  Return if symptoms worsen or fail to improve, for PCP.  Dana Allan, MD

## 2022-08-25 NOTE — Assessment & Plan Note (Addendum)
Weight increased since last visit. No red flags. Appetite good and hydrating well Reviewed recent meal journal and notable for low calorie intake.   Increase caloric intake Increase protein with each meal Follow up if no improvement in symptoms

## 2022-08-25 NOTE — Assessment & Plan Note (Signed)
Exam benign today. Resolved with use of Nystatin Recommend she discuss options of cleaning dentures during her upcoming visit with denturist

## 2022-09-01 ENCOUNTER — Telehealth: Payer: Self-pay | Admitting: Family Medicine

## 2022-09-01 NOTE — Telephone Encounter (Signed)
Prescription Request  09/01/2022  LOV: 08/20/2022  What is the name of the medication or equipment? tiZANidine (ZANAFLEX) 4 MG tablet  Have you contacted your pharmacy to request a refill? Yes   Which pharmacy would you like this sent to?   [id number] [place], [place] - [address] AT [facility] OF [address] [place] [address] Phone: [phone] Fax: [phone]    Patient notified that their request is being sent to the clinical staff for review and that they should receive a response within 2 business days.   Please advise at Mobile [phone] (mobile)

## 2022-09-03 ENCOUNTER — Telehealth: Payer: Self-pay | Admitting: Student in an Organized Health Care Education/Training Program

## 2022-09-03 NOTE — Telephone Encounter (Signed)
Pt called back regarding her previous refills. Note below was read to her pt aware. Pt stated if theres another muscle relaxer that can take? Please advice.

## 2022-09-03 NOTE — Telephone Encounter (Signed)
Having increased pain and burning in leg. Concerned that she will not have enough Hydrocodone to last until her appt on 09-16-22. I advised her that we cannot send prescriptions for opioids outside of an appt. Her last fill was 06-17-22. Spoke with secretaries, they can reschedule her to an earlier appt. Patient agreed to do this.

## 2022-09-03 NOTE — Telephone Encounter (Signed)
PT states that she is having extremely pain in her left leg. PT states that her leg is weak and feels like her leg is on fire. PT stated that she has been taking 3 pills a day. PT stated that she only has 11 pills left. Wanted to see if Dr. Cherylann Ratel will send in some medication for her. PT states that she has been using some muscle relaxer cream to help with the pain. Please give patient a call. TY

## 2022-09-05 ENCOUNTER — Other Ambulatory Visit: Payer: Self-pay | Admitting: Family Medicine

## 2022-09-05 DIAGNOSIS — M5410 Radiculopathy, site unspecified: Secondary | ICD-10-CM

## 2022-09-05 MED ORDER — TIZANIDINE HCL 4 MG PO TABS: 4.0000 mg | ORAL_TABLET | Freq: Every day | ORAL | 3 refills | Status: DC

## 2022-09-05 NOTE — Telephone Encounter (Signed)
Spoke with pt and she stated that she is not taking the baclofen, she has been taking the tizanidine because it also helps her sleep at night.

## 2022-09-08 ENCOUNTER — Telehealth: Payer: Self-pay | Admitting: Family Medicine

## 2022-09-08 NOTE — Telephone Encounter (Signed)
Pt called stating she want a referral for a rash and bumps that is behind her ear, tongue and mouth. Pt states it looks like she has thrush in her mouth and she does not want to be referred to the person in [place]. Pt does not know the name of that doctor

## 2022-09-09 ENCOUNTER — Encounter: Payer: Self-pay | Admitting: Student in an Organized Health Care Education/Training Program

## 2022-09-09 ENCOUNTER — Ambulatory Visit
Payer: Medicare Other | Attending: Student in an Organized Health Care Education/Training Program | Admitting: Student in an Organized Health Care Education/Training Program

## 2022-09-09 VITALS — BP 106/72 | HR 73 | Temp 97.3°F | Ht 65.0 in | Wt 118.0 lb

## 2022-09-09 DIAGNOSIS — M51369 Other intervertebral disc degeneration, lumbar region without mention of lumbar back pain or lower extremity pain: Secondary | ICD-10-CM

## 2022-09-09 DIAGNOSIS — G894 Chronic pain syndrome: Secondary | ICD-10-CM | POA: Diagnosis not present

## 2022-09-09 DIAGNOSIS — M5416 Radiculopathy, lumbar region: Secondary | ICD-10-CM | POA: Insufficient documentation

## 2022-09-09 DIAGNOSIS — M5136 Other intervertebral disc degeneration, lumbar region: Secondary | ICD-10-CM | POA: Diagnosis not present

## 2022-09-09 DIAGNOSIS — M961 Postlaminectomy syndrome, not elsewhere classified: Secondary | ICD-10-CM

## 2022-09-09 DIAGNOSIS — G8929 Other chronic pain: Secondary | ICD-10-CM

## 2022-09-09 DIAGNOSIS — M48062 Spinal stenosis, lumbar region with neurogenic claudication: Secondary | ICD-10-CM

## 2022-09-09 MED ORDER — HYDROCODONE-ACETAMINOPHEN 7.5-325 MG PO TABS: 1.0000 | ORAL_TABLET | Freq: Three times a day (TID) | ORAL | 0 refills | Status: AC | PRN

## 2022-09-09 NOTE — Progress Notes (Signed)
PROVIDER NOTE: Information contained herein reflects review and annotations entered in association with encounter. Interpretation of such information and data should be left to medically-trained personnel. Information provided to patient can be located elsewhere in the medical record under "Patient Instructions". Document created using STT-dictation technology, any transcriptional errors that may result from process are unintentional.    Patient: Amy Grant  Service Category: E/M  Provider: Edward Jolly, MD  DOB: 02-Mar-1951  DOS: 09/09/2022  Referring Provider: Dana Allan, MD  MRN: [id number]  Specialty: Interventional Pain Management  PCP: Dana Allan, MD  Type: Established Patient  Setting: Ambulatory outpatient    Location: Office  Delivery: Face-to-face     HPI  [name], a 72 y.o. year old female, is here today because of her Chronic radicular lumbar pain [M54.16, G89.29]. [name] primary complain today is Hip Pain (Left hip and leg) Last encounter: My last encounter with her was on 04/21/22 Pertinent problems: [name] has Radiculopathy; Spinal stenosis, lumbar region, with neurogenic claudication; Lumbar degenerative disc disease; Chronic pain syndrome; and Pain management contract signed on their pertinent problem list. Pain Assessment: Severity of Chronic pain is reported as a 3 /10. Location: Hip Left/down left leg to calf. Onset: More than a month ago. Quality: Constant, Burning, Stabbing. Timing: Constant. Modifying factor(s): meds, lay on right side. Vitals:  height is 5\' 5"  (1.651 m) and weight is 118 lb (53.5 kg). Her temporal temperature is 97.3 F (36.3 C) (abnormal). Her blood pressure is 106/72 and her pulse is 73. Her oxygen saturation is 98%.  BMI: Estimated body mass index is 19.64 kg/m as calculated from the following:   Height as of this encounter: 5\' 5"  (1.651 m).   Weight as of this encounter: 118 lb (53.5 kg).  Reason for encounter: medication  management. Also new problems.  -Left hand and thumb pain have improved with hand exercises -She became very tearful today talking about her daughter and lack of a supportive relationship between the 2 -Endorses persistent low back pain with radiation into left leg in a dermatomal fashion. Has been stretching. -She has a history of L4-L5 lumbar spinal fusion.  We have discussed lumbar spinal injections as well as spinal cord stimulation.  We reviewed her lumbar MRI again.   ROS  Constitutional: Denies any fever or chills Gastrointestinal: No reported hemesis, hematochezia, vomiting, or acute GI distress Musculoskeletal:  left wrist pain improved with hand exercise Low back pain with radiation into left leg Neurological: No reported episodes of acute onset apraxia, aphasia, dysarthria, agnosia, amnesia, paralysis, loss of coordination, or loss of consciousness  Medication Review  Calcium-Magnesium-Vitamin D, HYDROcodone-acetaminophen, Sod Fluoride-Potassium Nitrate, albuterol, gabapentin, nystatin, tiZANidine, and verapamil  History Review  Allergy: [name] is allergic to latex, sulfa antibiotics, and linaclotide. Drug: [name]  reports no history of drug use. Alcohol:  reports current alcohol use of about 7.0 standard drinks of alcohol per week. Tobacco:  reports that she quit smoking about 11 years ago. Her smoking use included cigarettes. She has never used smokeless tobacco. Social: [name]  reports that she quit smoking about 11 years ago. Her smoking use included cigarettes. She has never used smokeless tobacco. She reports current alcohol use of about 7.0 standard drinks of alcohol per week. She reports that she does not use drugs. Medical:  has a past medical history of Anxiety, Arthritis, Asthma, Blood in stool, Cervical radicular pain (11/16/2019), Chronic intermittent post-traumatic headache, Chronic neck pain with history of  cervical spinal surgery (03/28/2017), Chronic  radicular lumbar pain (08/07/2016), Colon polyps, Complication of anesthesia (2018), Depression, Failed back surgical syndrome (12/17/2021), Family history of adverse reaction to anesthesia, GERD (gastroesophageal reflux disease), Head injury, Headache, Hepatitis, Hepatitis C, History of blood transfusion, History of hepatitis C (03/27/2020), History of lumbar surgery (11/21/2019), Hypertension, Laryngopharyngeal reflux (LPR), Multiple falls, Oral lichen planus, Osteoarthritis (03/28/2017), Pancreatitis, Seizures (HCC), and Vitamin D deficiency. Surgical: [name]  has a past surgical history that includes Hernia repair; Cesarean section; Exploratory laparotomy; Tonsillectomy; Appendectomy; Colon resection; Colonoscopy; Anterior cervical decomp/discectomy fusion (N/A, 08/07/2016); Breast biopsy (Left); Abdominal hysterectomy (1979); acdf; Transforaminal lumbar interbody fusion (tlif) with pedicle screw fixation 1 level (Right, 03/24/2018); and low back surgery. Family: family history includes Arthritis in her maternal grandmother and sister; Breast cancer (age of onset: 46) in her sister; Cancer in her maternal grandfather, mother, and sister; Diabetes in her paternal grandmother and sister; Heart disease in her mother; Heart failure in her mother; Hyperlipidemia in her maternal grandmother; Hypertension in her mother; Learning disabilities in her maternal grandmother; Miscarriages / [place] in her daughter, mother, sister, and sister; Multiple sclerosis in her father; Other in her sister.  Laboratory Chemistry Profile   Renal Lab Results  Component Value Date   BUN 10 07/14/2022   CREATININE 1.04 07/14/2022   BCR 12 08/02/2021   GFR 54.67 (L) 07/14/2022   GFRAA >60 03/18/2018   GFRNONAA >60 03/18/2018    Hepatic Lab Results  Component Value Date   AST 22 07/14/2022   ALT 16 07/14/2022   ALBUMIN 4.4 07/14/2022   ALKPHOS 65 07/14/2022    Electrolytes Lab Results  Component Value Date   NA  142 07/14/2022   K 3.9 07/14/2022   CL 103 07/14/2022   CALCIUM 9.5 07/14/2022    Bone Lab Results  Component Value Date   VD25OH 119.53 (HH) 07/14/2022    Inflammation (CRP: Acute Phase) (ESR: Chronic Phase) No results found for: "CRP", "ESRSEDRATE", "LATICACIDVEN"       Note: Above Lab results reviewed.  Recent Imaging Review  MR LUMBAR SPINE W WO CONTRAST CLINICAL DATA:  Lumbar radiculopathy. Chronic mid and lower back pain radiating to the hips and legs, right greater than left.  EXAM: MRI LUMBAR SPINE WITHOUT AND WITH CONTRAST  TECHNIQUE: Multiplanar and multiecho pulse sequences of the lumbar spine were obtained without and with intravenous contrast.  CONTRAST:  6 mL Vueway  COMPARISON:  Lumbar spine MRI 11/01/2020  FINDINGS: Segmentation:  Standard.  Alignment:  Unchanged 5 mm anterolisthesis of L4 on L5.  Vertebrae: No fracture, suspicious marrow lesion, or evidence of discitis. Prior posterior and interbody fusion at L4-5 with persistent mild degenerative endplate edema at this level.  Conus medullaris and cauda equina: Conus extends to the L1-2 level. Conus and cauda equina appear normal.  Paraspinal and other soft tissues: Postoperative changes in the posterior lower lumbar soft tissues. No fluid collection.  Disc levels:  T12-L1: Mild facet hypertrophy without disc herniation or stenosis, unchanged.  L1-2: Mild facet hypertrophy without disc herniation or stenosis, unchanged.  L2-3: Minimal disc bulging and moderate facet and ligamentum flavum hypertrophy without stenosis, unchanged.  L3-4: Disc bulging and severe facet and ligamentum flavum hypertrophy result in borderline spinal and neural foraminal stenosis, unchanged.  L4-5: Prior fusion. Anterolisthesis with bulging uncovered disc and facet and ligamentum flavum hypertrophy result in mild spinal stenosis and moderate right and mild left neural foraminal stenosis, unchanged.  L5-S1:  Severe disc space narrowing. Disc  bulging, endplate spurring, a chronic left foraminal disc extrusion, and moderate facet hypertrophy result in mild-to-moderate right and moderate to severe left neural foraminal stenosis without spinal stenosis. The disc extrusion appears smaller than on the prior study with mildly improved left foraminal stenosis, although there is still potential left L5 nerve root impingement.  IMPRESSION: 1. Chronic left foraminal disc extrusion at L5-S1 with slight improvement of moderate to severe foraminal stenosis. 2. Unchanged disc and facet degeneration elsewhere. Mild spinal stenosis and moderate right and mild left neural foraminal stenosis at the fused L4-5 level.  Electronically Signed   By: Sebastian Ache M.D.   On: 04/16/2022 08:10 MR THORACIC SPINE WO CONTRAST CLINICAL DATA:  Osteoarthritis, thoracic. Chronic mid and lower back pain radiating to the hips and legs, right greater than left.  EXAM: MRI THORACIC SPINE WITHOUT CONTRAST  TECHNIQUE: Multiplanar, multisequence MR imaging of the thoracic spine was performed. No intravenous contrast was administered.  COMPARISON:  Cervical spine MRI 12/02/2019  FINDINGS: Alignment:  Normal.  Vertebrae: No fracture, suspicious marrow lesion, or evidence of discitis. Limited assessment of the cervical spine on large field-of-view sagittal images again demonstrates prior C4-5 ACDF and advanced disc degeneration from C5-6 through C7-T1 as shown on the prior cervical spine MRI.  Cord:  Normal signal and morphology.  Paraspinal and other soft tissues: Unremarkable.  Disc levels:  At C7-T1, disc bulging, uncovertebral spurring, and facet arthrosis result in moderate right and mild left neural foraminal stenosis, similar to the prior cervical spine MRI. No spinal stenosis.  There is minor disc bulging at T1-2 and T2-3 without stenosis. There is multilevel facet arthrosis which is moderate at T3-4 and  T11-12. No significant neural foraminal stenosis.  IMPRESSION: 1. Mild thoracic spondylosis and mild-to-moderate facet arthrosis without spinal stenosis. 2. Moderate right and mild left neural foraminal stenosis at C7-T1. 3.  Electronically Signed   By: Sebastian Ache M.D.   On: 04/16/2022 07:54 Note: Reviewed        Physical Exam  General appearance: Well nourished, well developed, and well hydrated. In no apparent acute distress Mental status: Alert, oriented x 3 (person, place, & time)       Respiratory: No evidence of acute respiratory distress Eyes: PERLA Vitals: BP 106/72   Pulse 73   Temp (!) 97.3 F (36.3 C) (Temporal)   Ht 5\' 5"  (1.651 m)   Wt 118 lb (53.5 kg)   SpO2 98%   BMI 19.64 kg/m  BMI: Estimated body mass index is 19.64 kg/m as calculated from the following:   Height as of this encounter: 5\' 5"  (1.651 m).   Weight as of this encounter: 118 lb (53.5 kg). Ideal: Ideal body weight: 57 kg (125 lb 10.6 oz)  Lumbar Spine Area Exam  Skin & Axial Inspection: Well healed scar from previous spine surgery detected Alignment: Symmetrical Functional ROM: Pain restricted ROM affecting both sides Stability: No instability detected Muscle Tone/Strength: Functionally intact. No obvious neuro-muscular anomalies detected. Sensory (Neurological): Dermatomal pain pattern L4/5 (left) Palpation: No palpable anomalies         Positive straight leg raise test bilaterally Ambulation: Unassisted Gait: Relatively normal for age and body habitus Posture: WNL    Lower Extremity Exam      Side: Right lower extremity   Side: Left lower extremity  Stability: No instability observed           Stability: No instability observed          Skin & Extremity  Inspection: Skin color, temperature, and hair growth are WNL. No peripheral edema or cyanosis. No masses, redness, swelling, asymmetry, or associated skin lesions. No contractures.   Skin & Extremity Inspection: Skin color, temperature,  and hair growth are WNL. No peripheral edema or cyanosis. No masses, redness, swelling, asymmetry, or associated skin lesions. No contractures.  Functional ROM: Unrestricted ROM                   Functional ROM: Unrestricted ROM                  Muscle Tone/Strength: Functionally intact. No obvious neuro-muscular anomalies detected.   Muscle Tone/Strength: Functionally intact. No obvious neuro-muscular anomalies detected.  Sensory (Neurological): Dermatomal pain pattern         Sensory (Neurological): Dermatomal pain pattern        DTR: Patellar: deferred today Achilles: deferred today Plantar: deferred today   DTR: Patellar: deferred today Achilles: deferred today Plantar: deferred today  Palpation: No palpable anomalies   Palpation: No palpable anomalies     Assessment   Diagnosis Status  1. Chronic radicular lumbar pain   2. Spinal stenosis, lumbar region, with neurogenic claudication   3. Post laminectomy syndrome   4. Failed back surgical syndrome   5. Lumbar degenerative disc disease   6. Chronic pain syndrome     Controlled Controlled Controlled     Plan of Care   1. Chronic radicular lumbar pain  2. Spinal stenosis, lumbar region, with neurogenic claudication  3. Post laminectomy syndrome  4. Failed back surgical syndrome  5. Lumbar degenerative disc disease  6. Chronic pain syndrome - ToxASSURE Select 13 (MW), Urine  Requested Prescriptions   Signed Prescriptions Disp Refills   HYDROcodone-acetaminophen (NORCO) 7.5-325 MG tablet 90 tablet 0    Sig: Take 1 tablet by mouth 3 (three) times daily as needed for severe pain. Must last 30 days   I have discussed spinal injections (L-TF ESI) as well as a SCS trial.  Discontinue Tizanidine due to cognitive side effects  Continue Gabapentin as prescribed Continue Baclofen prn   Follow-up plan:   Return in about 3 months (around 12/10/2022) for Medication Management, in person.     Recent Visits Date Type  Provider Dept  06/17/22 Office Visit Edward Jolly, MD [facility]  Showing recent visits within past 90 days and meeting all other requirements Today's Visits Date Type Provider Dept  09/09/22 Office Visit Edward Jolly, MD [facility]  Showing today's visits and meeting all other requirements Future Appointments Date Type Provider Dept  12/04/22 Appointment Edward Jolly, MD [facility]  Showing future appointments within next 90 days and meeting all other requirements  I discussed the assessment and treatment plan with the patient. The patient was provided an opportunity to ask questions and all were answered. The patient agreed with the plan and demonstrated an understanding of the instructions.  Patient advised to call back or seek an in-person evaluation if the symptoms or condition worsens.  Duration of encounter: .  Total time on encounter, as per AMA guidelines included both the face-to-face and non-face-to-face time personally spent by the physician and/or other qualified health care professional(s) on the day of the encounter (includes time in activities that require the physician or other qualified health care professional and does not include time in activities normally performed by clinical staff). Physician's time may include the following activities when performed: Preparing to see the patient (e.g., pre-charting review  of records, searching for previously ordered imaging, lab work, and nerve conduction tests) Review of prior analgesic pharmacotherapies. Reviewing PMP Interpreting ordered tests (e.g., lab work, imaging, nerve conduction tests) Performing post-procedure evaluations, including interpretation of diagnostic procedures Obtaining and/or reviewing separately obtained history Performing a medically appropriate examination and/or evaluation Counseling and educating the patient/family/caregiver Ordering medications, tests, or  procedures Referring and communicating with other health care professionals (when not separately reported) Documenting clinical information in the electronic or other health record Independently interpreting results (not separately reported) and communicating results to the patient/ family/caregiver Care coordination (not separately reported)  Note by: Edward Jolly, MD Date: 09/09/2022; Time: 8:51 AM

## 2022-09-09 NOTE — Progress Notes (Signed)
Nursing Pain Medication Assessment:  Safety precautions to be maintained throughout the outpatient stay will include: orient to surroundings, keep bed in low position, maintain call bell within reach at all times, provide assistance with transfer out of bed and ambulation.  Medication Inspection Compliance: Pill count conducted under aseptic conditions, in front of the patient. Neither the pills nor the bottle was removed from the patient's sight at any time. Once count was completed pills were immediately returned to the patient in their original bottle.  Medication: Hydrocodone/APAP Pill/Patch Count:  4 of 90 pills remain Pill/Patch Appearance: Markings consistent with prescribed medication Bottle Appearance: Standard pharmacy container. Clearly labeled. Filled Date: 4 / 7 / 2024 Last Medication intake:  YesterdaySafety precautions to be maintained throughout the outpatient stay will include: orient to surroundings, keep bed in low position, maintain call bell within reach at all times, provide assistance with transfer out of bed and ambulation.

## 2022-09-11 LAB — TOXASSURE SELECT 13 (MW), URINE

## 2022-09-12 ENCOUNTER — Other Ambulatory Visit: Payer: Self-pay | Admitting: Family Medicine

## 2022-09-13 ENCOUNTER — Other Ambulatory Visit: Payer: Self-pay | Admitting: Family Medicine

## 2022-09-13 DIAGNOSIS — L438 Other lichen planus: Secondary | ICD-10-CM

## 2022-09-13 DIAGNOSIS — B370 Candidal stomatitis: Secondary | ICD-10-CM

## 2022-09-13 DIAGNOSIS — M3500 Sicca syndrome, unspecified: Secondary | ICD-10-CM

## 2022-09-13 DIAGNOSIS — Z972 Presence of dental prosthetic device (complete) (partial): Secondary | ICD-10-CM

## 2022-09-15 NOTE — Telephone Encounter (Signed)
 Left detailed message for patient.

## 2022-09-16 ENCOUNTER — Encounter: Payer: Medicare Other | Admitting: Student in an Organized Health Care Education/Training Program

## 2022-09-18 DIAGNOSIS — R682 Dry mouth, unspecified: Secondary | ICD-10-CM | POA: Diagnosis not present

## 2022-09-18 DIAGNOSIS — K219 Gastro-esophageal reflux disease without esophagitis: Secondary | ICD-10-CM | POA: Diagnosis not present

## 2022-10-01 DIAGNOSIS — Z136 Encounter for screening for cardiovascular disorders: Secondary | ICD-10-CM | POA: Insufficient documentation

## 2022-10-01 DIAGNOSIS — Z0000 Encounter for general adult medical examination without abnormal findings: Secondary | ICD-10-CM | POA: Insufficient documentation

## 2022-10-01 NOTE — Assessment & Plan Note (Signed)
HCM Recommend tetanus booster Colonoscopy 2015 at [facility].  Request results.  Due 2025 Medicare annual wellness due.  Patient to schedule appointment Mammogram up-to-date.  Due 09/24.  Referral sent to [facility] breast imaging DEXA completed.  Osteopenic.  Currently on calcium and vitamin D supplements. Recommend pneumonia 20 vaccine Recommend shingles vaccine Hepatitis C screening completed

## 2022-10-01 NOTE — Addendum Note (Signed)
Addended by: Enid Cutter on: 10/01/2022 08:00 AM   Modules accepted: Level of Service

## 2022-10-01 NOTE — Addendum Note (Signed)
Addended by: Enid Cutter on: 10/01/2022 08:03 AM   Modules accepted: Level of Service

## 2022-10-28 ENCOUNTER — Ambulatory Visit: Payer: Medicare Other | Admitting: Physician Assistant

## 2022-11-06 ENCOUNTER — Telehealth: Payer: Self-pay | Admitting: Family Medicine

## 2022-11-06 NOTE — Telephone Encounter (Signed)
Copied from CRM [id number]. Topic: Medicare AWV >> Nov 06, 2022 10:09 AM Payton Doughty wrote: Reason for CRM: LM 11/06/2022 to schedule AWV   Verlee Rossetti; Care Guide Ambulatory Clinical Support [place] [facility] Direct Dial: [phone]

## 2022-11-07 ENCOUNTER — Other Ambulatory Visit: Payer: Self-pay

## 2022-11-07 DIAGNOSIS — M5416 Radiculopathy, lumbar region: Secondary | ICD-10-CM

## 2022-11-07 MED ORDER — GABAPENTIN 300 MG PO CAPS: ORAL_CAPSULE | ORAL | 3 refills | Status: DC

## 2022-11-14 DIAGNOSIS — Z1231 Encounter for screening mammogram for malignant neoplasm of breast: Secondary | ICD-10-CM | POA: Diagnosis not present

## 2022-11-14 LAB — HM MAMMOGRAPHY

## 2022-11-17 ENCOUNTER — Telehealth: Payer: Self-pay | Admitting: Gastroenterology

## 2022-11-17 NOTE — Telephone Encounter (Signed)
Patient called in to schedule her office visit.

## 2022-11-17 NOTE — Telephone Encounter (Signed)
Patient called in to reschedule her office visit.

## 2022-11-25 ENCOUNTER — Ambulatory Visit (INDEPENDENT_AMBULATORY_CARE_PROVIDER_SITE_OTHER): Payer: Medicare Other | Admitting: *Deleted

## 2022-11-25 VITALS — Ht 65.0 in | Wt 115.0 lb

## 2022-11-25 DIAGNOSIS — Z0000 Encounter for general adult medical examination without abnormal findings: Secondary | ICD-10-CM

## 2022-11-25 NOTE — Patient Instructions (Signed)
Ms. Rahaman , Thank you for taking time to come for your Medicare Wellness Visit. I appreciate your ongoing commitment to your health goals. Please review the following plan we discussed and let me know if I can assist you in the future.   Referrals/Orders/Follow-Ups/Clinician Recommendations: Need to update vaccines  This is a list of the screening recommended for you and due dates:  Health Maintenance  Topic Date Due   DTaP/Tdap/Td vaccine (1 - Tdap) Never done   Colon Cancer Screening  Never done   Flu Shot  Never done   COVID-19 Vaccine (4 - 2023-24 season) 11/02/2022   Mammogram  11/10/2022   Medicare Annual Wellness Visit  11/25/2023   DEXA scan (bone density measurement)  Completed   Hepatitis C Screening  Completed   HPV Vaccine  Aged Out   Pneumonia Vaccine  Discontinued   Zoster (Shingles) Vaccine  Discontinued    Advanced directives: (Declined) Advance directive discussed with you today. Even though you declined this today, please call our office should you change your mind, and we can give you the proper paperwork for you to fill out.  Next Medicare Annual Wellness Visit scheduled for next year: Yes 12/02/23 @3 :15   Managing Pain Without Opioids Opioids are strong medicines used to treat moderate to severe pain. For some people, especially those who have long-term (chronic) pain, opioids may not be the best choice for pain management due to: Side effects like nausea, constipation, and sleepiness. The risk of addiction (opioid use disorder). The longer you take opioids, the greater your risk of addiction. Pain that lasts for more than 3 months is called chronic pain. Managing chronic pain usually requires more than one approach and is often provided by a team of health care providers working together (multidisciplinary approach). Pain management may be done at a pain management center or pain clinic. How to manage pain without the use of opioids Use non-opioid  medicines Non-opioid medicines for pain may include: Over-the-counter or prescription non-steroidal anti-inflammatory drugs (NSAIDs). These may be the first medicines used for pain. They work well for muscle and bone pain, and they reduce swelling. Acetaminophen. This over-the-counter medicine may work well for milder pain but not swelling. Antidepressants. These may be used to treat chronic pain. A certain type of antidepressant (tricyclics) is often used. These medicines are given in lower doses for pain than when used for depression. Anticonvulsants. These are usually used to treat seizures but may also reduce nerve (neuropathic) pain. Muscle relaxants. These relieve pain caused by sudden muscle tightening (spasms). You may also use a pain medicine that is applied to the skin as a patch, cream, or gel (topical analgesic), such as a numbing medicine. These may cause fewer side effects than medicines taken by mouth. Do certain therapies as directed Some therapies can help with pain management. They include: Physical therapy. You will do exercises to gain strength and flexibility. A physical therapist may teach you exercises to move and stretch parts of your body that are weak, stiff, or painful. You can learn these exercises at physical therapy visits and practice them at home. Physical therapy may also involve: Massage. Heat wraps or applying heat or cold to affected areas. Electrical signals that interrupt pain signals (transcutaneous electrical nerve stimulation, TENS). Weak lasers that reduce pain and swelling (low-level laser therapy). Signals from your body that help you learn to regulate pain (biofeedback). Occupational therapy. This helps you to learn ways to function at home and work with less  pain. Recreational therapy. This involves trying new activities or hobbies, such as a physical activity or drawing. Mental health therapy, including: Cognitive behavioral therapy (CBT). This helps  you learn coping skills for dealing with pain. Acceptance and commitment therapy (ACT) to change the way you think and react to pain. Relaxation therapies, including muscle relaxation exercises and mindfulness-based stress reduction. Pain management counseling. This may be individual, family, or group counseling.  Receive medical treatments Medical treatments for pain management include: Nerve block injections. These may include a pain blocker and anti-inflammatory medicines. You may have injections: Near the spine to relieve chronic back or neck pain. Into joints to relieve back or joint pain. Into nerve areas that supply a painful area to relieve body pain. Into muscles (trigger point injections) to relieve some painful muscle conditions. A medical device placed near your spine to help block pain signals and relieve nerve pain or chronic back pain (spinal cord stimulation device). Acupuncture. Follow these instructions at home Medicines Take over-the-counter and prescription medicines only as told by your health care provider. If you are taking pain medicine, ask your health care providers about possible side effects to watch out for. Do not drive or use heavy machinery while taking prescription opioid pain medicine. Lifestyle  Do not use drugs or alcohol to reduce pain. If you drink alcohol, limit how much you have to: 0-1 drink a day for women who are not pregnant. 0-2 drinks a day for men. Know how much alcohol is in a drink. In the [place] one drink equals one 12 oz bottle of beer (355 mL), one 5 oz glass of wine (148 mL), or one 1 oz glass of hard liquor (44 mL). Do not use any products that contain nicotine or tobacco. These products include cigarettes, chewing tobacco, and vaping devices, such as e-cigarettes. If you need help quitting, ask your health care provider. Eat a healthy diet and maintain a healthy weight. Poor diet and excess weight may make pain worse. Eat foods that  are high in fiber. These include fresh fruits and vegetables, whole grains, and beans. Limit foods that are high in fat and processed sugars, such as fried and sweet foods. Exercise regularly. Exercise lowers stress and may help relieve pain. Ask your health care provider what activities and exercises are safe for you. If your health care provider approves, join an exercise class that combines movement and stress reduction. Examples include yoga and tai chi. Get enough sleep. Lack of sleep may make pain worse. Lower stress as much as possible. Practice stress reduction techniques as told by your therapist. General instructions Work with all your pain management providers to find the treatments that work best for you. You are an important member of your pain management team. There are many things you can do to reduce pain on your own. Consider joining an online or in-person support group for people who have chronic pain. Keep all follow-up visits. This is important. Where to find more information You can find more information about managing pain without opioids from: American Academy of Pain Medicine: painmed.org Institute for Chronic Pain: instituteforchronicpain.org American Chronic Pain Association: theacpa.org Contact a health care provider if: You have side effects from pain medicine. Your pain gets worse or does not get better with treatments or home therapy. You are struggling with anxiety or depression. Summary Many types of pain can be managed without opioids. Chronic pain may respond better to pain management without opioids. Pain is best managed when you  and a team of health care providers work together. Pain management without opioids may include non-opioid medicines, medical treatments, physical therapy, mental health therapy, and lifestyle changes. Tell your health care providers if your pain gets worse or is not being managed well enough. This information is not intended to  replace advice given to you by your health care provider. Make sure you discuss any questions you have with your health care provider. Document Revised: 05/30/2020 Document Reviewed: 05/30/2020 Elsevier Patient Education  2024 [employer].

## 2022-11-25 NOTE — Progress Notes (Signed)
Subjective:   Amy Grant is a 72 y.o. female who presents for Medicare Annual (Subsequent) preventive examination.  Visit Complete: Virtual  I connected with  [name] on 11/25/22 by a audio enabled telemedicine application and verified that I am speaking with the correct person using two identifiers.  Patient Location: Home  Provider Location: Home Office  I discussed the limitations of evaluation and management by telemedicine. The patient expressed understanding and agreed to proceed.  Patient Medicare AWV questionnaire was completed by the patient on 11/21/22; I have confirmed that all information answered by patient is correct and no changes since this date.  Vital Signs: Unable to obtain new vitals due to this being a telehealth visit.   Cardiac Risk Factors include: hypertension Advanced age 10    Objective:    Today's Vitals   11/25/22 1542 11/25/22 1543  Weight: 115 lb (52.2 kg)   Height: 5\' 5"  (1.651 m)   PainSc:  4    Body mass index is 19.14 kg/m.     11/25/2022    4:00 PM 09/09/2022    8:11 AM 04/21/2022    2:41 PM 12/17/2021    9:21 AM 08/06/2021    2:11 PM 07/27/2020   12:52 PM 07/27/2019    1:43 PM  Advanced Directives  Does Patient Have a Medical Advance Directive? Yes Yes No No Yes No No  Type of Estate agent of [place];Living will Healthcare Power of [employer] [place];Living will    Does patient want to make changes to medical advance directive?     No - Patient declined    Copy of Healthcare Power of Attorney in Chart? No - copy requested    No - copy requested    Would patient like information on creating a medical advance directive?   No - Patient declined No - Patient declined  No - Patient declined Yes (MAU/Ambulatory/Procedural Areas - Information given)    Current Medications (verified) Outpatient Encounter Medications as of 11/25/2022  Medication Sig   albuterol (VENTOLIN HFA) 108 (90 Base)  MCG/ACT inhaler Inhale 1-2 puffs into the lungs every 6 (six) hours as needed for wheezing or shortness of breath.   Calcium-Magnesium-Vitamin D (CALCIUM 1200+D3 PO) Take 2 tablets by mouth daily.   gabapentin (NEURONTIN) 300 MG capsule TAKE 2 CAPSULES(600 MG) BY MOUTH TWICE DAILY   HYDROcodone-acetaminophen (NORCO) 7.5-325 MG tablet Take 1 tablet by mouth every 6 (six) hours as needed for moderate pain.   nystatin (MYCOSTATIN) 100000 UNIT/ML suspension Take 5 mLs (500,000 Units total) by mouth 4 (four) times daily.   tiZANidine (ZANAFLEX) 4 MG tablet Take 1 tablet (4 mg total) by mouth at bedtime.   verapamil (CALAN-SR) 240 MG CR tablet TAKE 1 TABLET(240 MG) BY MOUTH AT BEDTIME   No facility-administered encounter medications on file as of 11/25/2022.    Allergies (verified) Latex, Sulfa antibiotics, and Linaclotide   History: Past Medical History:  Diagnosis Date   Anxiety    Arthritis    right hand, DDD L 4 , L 5   Asthma    Blood in stool    Cervical radicular pain 11/16/2019   Chronic intermittent post-traumatic headache    Chronic neck pain with history of cervical spinal surgery 03/28/2017   S/p surgery Dr. Yevette Edwards   Chronic radicular lumbar pain 08/07/2016   Reviewed MRI lumbar 04/2016 degenerative changes worse L5-S1 disc desiccation with ht loss, b/l facet effusion, synovial cysts, mild  facet arthropathy, disc bulging and arthropathy at some areas marked also marked right neural foraminal narrowing with compression of exiting nerve rool L4/5, L5/S1 disc bulge and b/l foraminal extension of marked facet arthropathy>>>severe b/l neural foraminal stenos   Colon polyps    Complication of anesthesia 2018   difficulty waking up    Depression    Failed back surgical syndrome 12/17/2021   Family history of adverse reaction to anesthesia    Sister woke up during surgery   GERD (gastroesophageal reflux disease)    Head injury    Headache    h/o migraines    Hepatitis     Hepatitis C    treated with mavyret per GI   History of blood transfusion    History of hepatitis C 03/27/2020   History of lumbar surgery 11/21/2019   Hypertension    Laryngopharyngeal reflux (LPR)    Multiple falls    Oral lichen planus    Osteoarthritis 03/28/2017   Pancreatitis    remote hx w/o h/o drinking at that time   Seizures (HCC)     after head injury   Vitamin D deficiency    Past Surgical History:  Procedure Laterality Date   ABDOMINAL HYSTERECTOMY  1979   has an ovary left s/p hysterectomy 1979; h/o abnormal pap    acdf     08/07/16 Dr. Yevette Edwards    ANTERIOR CERVICAL DECOMP/DISCECTOMY FUSION N/A 08/07/2016   Procedure: ANTERIOR CERVICAL DECOMPRESSION FUSION, CERVICAL FOUR-FIVE  WITH INSTRUMENTATION AND ALLOGRAFT;  Surgeon: Estill Bamberg, MD;  Location: [facility] OR;  Service: Orthopedics;  Laterality: N/A;   APPENDECTOMY     BREAST BIOPSY Left    over 20 years ago negative left breast as of 03/2020    CESAREAN SECTION     COLON RESECTION     paritial ? reason done in [place]    COLONOSCOPY     EXPLORATORY LAPAROTOMY     after C Section  x 2 - 1 for bleeding and 1 for infection   HERNIA REPAIR     Inguinal - as a child   low back surgery     Dr. Yevette Edwards    TONSILLECTOMY     TRANSFORAMINAL LUMBAR INTERBODY FUSION (TLIF) WITH PEDICLE SCREW FIXATION 1 LEVEL Right 03/24/2018   Procedure: RIGHT LUMBAR 4 - LUMBAR 5 TRANSFORAMINAL LUMBAR INTERBODY FUSION WITH INSTRUMENTATION AND ALLOGRAFT;  Surgeon: Estill Bamberg, MD;  Location: [facility] OR;  Service: Orthopedics;  Laterality: Right;   Family History  Problem Relation Age of Onset   Breast cancer Sister 65   Cancer Sister        breast    Diabetes Sister    Miscarriages / [place] Sister    Cancer Mother        breast/precancer changes   Heart disease Mother    Hypertension Mother    Miscarriages / Stillbirths Mother    Heart failure Mother    Multiple sclerosis Father    Miscarriages / [place] Daughter     Arthritis Maternal Grandmother    Hyperlipidemia Maternal Grandmother    Learning disabilities Maternal Grandmother    Cancer Maternal Grandfather    Diabetes Paternal Grandmother    Arthritis Sister    Miscarriages / [place] Sister    Other Sister        precancerous changes breast   Social History   Socioeconomic History   Marital status: Divorced    Spouse name: Not on file   Number of children: Not on  file   Years of education: Not on file   Highest education level: Associate degree: occupational, Scientist, product/process development, or vocational program  Occupational History   Not on file  Tobacco Use   Smoking status: Former    Current packs/day: 0.00    Types: Cigarettes    Start date: 61    Quit date: 2013    Years since quitting: 11.7   Smokeless tobacco: Never  Vaping Use   Vaping status: Never Used  Substance and Sexual Activity   Alcohol use: Yes    Alcohol/week: 7.0 standard drinks of alcohol    Types: 7 Glasses of wine per week    Comment: per week   Drug use: No   Sexual activity: Not on file  Other Topics Concern   Not on file  Social History Narrative   ** Merged History Encounter **       Associate degree  Wears seat belt, feels safe in marriage to Ron 1 daughter    Social Determinants of Health   Financial Resource Strain: Low Risk  (11/21/2022)   Overall Financial Resource Strain (CARDIA)    Difficulty of Paying Living Expenses: Not very hard  Food Insecurity: No Food Insecurity (11/21/2022)   Hunger Vital Sign    Worried About Running Out of Food in the Last Year: Never true    Ran Out of Food in the Last Year: Never true  Transportation Needs: No Transportation Needs (11/21/2022)   PRAPARE - Administrator, Civil Service (Medical): No    Lack of Transportation (Non-Medical): No  Physical Activity: Insufficiently Active (11/21/2022)   Exercise Vital Sign    Days of Exercise per Week: 2 days    Minutes of Exercise per Session: 20 min  Stress:  Stress Concern Present (11/21/2022)   [employer] of Occupational Health - Occupational Stress Questionnaire    Feeling of Stress : To some extent  Social Connections: Moderately Isolated (11/21/2022)   Social Connection and Isolation Panel [NHANES]    Frequency of Communication with Friends and Family: More than three times a week    Frequency of Social Gatherings with Friends and Family: Once a week    Attends Religious Services: More than 4 times per year    Active Member of [employer] or Organizations: No    Attends Engineer, structural: Never    Marital Status: Divorced    Tobacco Counseling Counseling given: Not Answered   Clinical Intake:  Pre-visit preparation completed: Yes  Pain : 0-10 Pain Score: 4  Pain Type: Chronic pain Pain Location: Back Pain Orientation: Lower Pain Descriptors / Indicators: Pressure, Aching Pain Onset: More than a month ago Pain Frequency: Intermittent     BMI - recorded: 19.14 Nutritional Status: BMI of 19-24  Normal Nutritional Risks: Unintentional weight loss (weeing GI) Diabetes: No  How often do you need to have someone help you when you read instructions, pamphlets, or other written materials from your doctor or pharmacy?: 1 - Never  Interpreter Needed?: No  Information entered by :: R. Kilea Mccarey LPN   Activities of Daily Living    11/21/2022    5:21 PM  In your present state of health, do you have any difficulty performing the following activities:  Hearing? 0  Vision? 0  Comment glasses  Difficulty concentrating or making decisions? 1  Walking or climbing stairs? 0  Dressing or bathing? 0  Doing errands, shopping? 0  Preparing Food and eating ? N  Using the Toilet?  N  In the past six months, have you accidently leaked urine? N  Do you have problems with loss of bowel control? N  Managing your Medications? N  Managing your Finances? N  Housekeeping or managing your Housekeeping? N    Patient Care Team: Dana Allan, MD as PCP - General (Family Medicine)  Indicate any recent Medical Services you may have received from other than [facility] providers in the past year (date may be approximate).     Assessment:   This is a routine wellness examination for Taylah.  Hearing/Vision screen Hearing Screening - Comments:: No issues Vision Screening - Comments:: Wears glasses   Goals Addressed             This Visit's Progress    Patient Stated       Wants to exercise more       Depression Screen    11/25/2022    3:53 PM 07/30/2022    3:50 PM 04/21/2022    2:41 PM 01/20/2022    2:29 PM 12/17/2021    9:21 AM 10/22/2021   11:12 AM 08/06/2021    2:10 PM  PHQ 2/9 Scores  PHQ - 2 Score 0 2 0 0 0 0 0  PHQ- 9 Score 6 10         Fall Risk    11/21/2022    5:21 PM 09/09/2022    8:11 AM 07/30/2022    3:50 PM 06/17/2022    8:18 AM 04/21/2022    2:41 PM  Fall Risk   Falls in the past year? 0 0 0 0 0  Number falls in past yr: 0  0 0 0  Injury with Fall? 0  0  0  Risk for fall due to :   No Fall Risks No Fall Risks History of fall(s)  Follow up Falls evaluation completed;Falls prevention discussed   Falls evaluation completed     MEDICARE RISK AT HOME: Medicare Risk at Home Any stairs in or around the home?: Yes If so, are there any without handrails?: No Home free of loose throw rugs in walkways, pet beds, electrical cords, etc?: No Adequate lighting in your home to reduce risk of falls?: Yes Life alert?: No Use of a cane, walker or w/c?: No Grab bars in the bathroom?: No Shower chair or bench in shower?: No Elevated toilet seat or a handicapped toilet?: No      Cognitive Function:    12/24/2017    2:11 PM  MMSE - Mini Mental State Exam  Orientation to time 5  Orientation to Place 5  Registration 3  Attention/ Calculation 5  Recall 3  Language- name 2 objects 2  Language- repeat 1  Language- follow 3 step command 3  Language- read & follow direction 1  Write a sentence 1  Copy  design 1  Total score 30        11/25/2022    4:00 PM 07/27/2019    1:54 PM  6CIT Screen  What Year? 0 points 0 points  What month? 0 points 0 points  What time? 0 points 0 points  Count back from 20 0 points   Months in reverse 0 points 0 points  Repeat phrase 4 points   Total Score 4 points     Immunizations Immunization History  Administered Date(s) Administered   PFIZER Comirnaty(Gray Top)Covid-19 Tri-Sucrose Vaccine 04/17/2020   PFIZER(Purple Top)SARS-COV-2 Vaccination 08/13/2019, 09/10/2019    TDAP status: Due, Education has been provided regarding  the importance of this vaccine. Advised may receive this vaccine at local pharmacy or Health Dept. Aware to provide a copy of the vaccination record if obtained from local pharmacy or Health Dept. Verbalized acceptance and understanding.  Flu Vaccine status: Due, Education has been provided regarding the importance of this vaccine. Advised may receive this vaccine at local pharmacy or Health Dept. Aware to provide a copy of the vaccination record if obtained from local pharmacy or Health Dept. Verbalized acceptance and understanding.  Pneumococcal vaccine status: Due, Education has been provided regarding the importance of this vaccine. Advised may receive this vaccine at local pharmacy or Health Dept. Aware to provide a copy of the vaccination record if obtained from local pharmacy or Health Dept. Verbalized acceptance and understanding.  Covid-19 vaccine status: Information provided on how to obtain vaccines.   Qualifies for Shingles Vaccine? Yes   Zostavax completed No   Shingrix Completed?: No.    Education has been provided regarding the importance of this vaccine. Patient has been advised to call insurance company to determine out of pocket expense if they have not yet received this vaccine. Advised may also receive vaccine at local pharmacy or Health Dept. Verbalized acceptance and understanding.  Screening Tests Health  Maintenance  Topic Date Due   DTaP/Tdap/Td (1 - Tdap) Never done   Colonoscopy  Never done   INFLUENZA VACCINE  Never done   COVID-19 Vaccine (4 - 2023-24 season) 11/02/2022   MAMMOGRAM  11/10/2022   Medicare Annual Wellness (AWV)  11/25/2023   DEXA SCAN  Completed   Hepatitis C Screening  Completed   HPV VACCINES  Aged Out   Pneumonia Vaccine 58+ Years old  Discontinued   Zoster Vaccines- Shingrix  Discontinued    Health Maintenance  Health Maintenance Due  Topic Date Due   DTaP/Tdap/Td (1 - Tdap) Never done   Colonoscopy  Never done   INFLUENZA VACCINE  Never done   COVID-19 Vaccine (4 - 2023-24 season) 11/02/2022   MAMMOGRAM  11/10/2022    Colorectal Cancer screening Has an appointment scheduled with GI 12/17/22  Mammogram status: Completed 11/14/22 at [facility]. Repeat every year  Bone Density status: Completed 03/2022. Results reflect: Bone density results: OSTEOPOROSIS. Repeat every 2 years.  Lung Cancer Screening: (Low Dose CT Chest recommended if Age 54-80 years, 20 pack-year currently smoking OR have quit w/in 15years.) does not qualify.     Additional Screening:  Hepatitis C Screening: does qualify; Completed 2021  Vision Screening: Recommended annual ophthalmology exams for early detection of glaucoma and other disorders of the eye. Is the patient up to date with their annual eye exam?  Yes  Who is the provider or what is the name of the office in which the patient attends annual eye exams? [facility]  If pt is not established with a provider, would they like to be referred to a provider to establish care? No .   Dental Screening: Recommended annual dental exams for proper oral hygiene    Community Resource Referral / Chronic Care Management: CRR required this visit?  No   CCM required this visit?  No     Plan:     I have personally reviewed and noted the following in the patient's chart:   Medical and social history Use of alcohol,  tobacco or illicit drugs  Current medications and supplements including opioid prescriptions. Patient is currently taking opioid prescriptions. Information provided to patient regarding non-opioid alternatives. Patient advised to discuss non-opioid treatment plan  with their provider. Functional ability and status Nutritional status Physical activity Advanced directives List of other physicians Hospitalizations, surgeries, and ER visits in previous 12 months Vitals Screenings to include cognitive, depression, and falls Referrals and appointments  In addition, I have reviewed and discussed with patient certain preventive protocols, quality metrics, and best practice recommendations. A written personalized care plan for preventive services as well as general preventive health recommendations were provided to patient.     Sydell Axon, LPN   06/09/8117   After Visit Summary: (MyChart) Due to this being a telephonic visit, the after visit summary with patients personalized plan was offered to patient via MyChart   Nurse Notes: None

## 2022-11-26 ENCOUNTER — Ambulatory Visit: Payer: Medicare Other | Admitting: Gastroenterology

## 2022-12-04 ENCOUNTER — Ambulatory Visit
Payer: Medicare Other | Attending: Student in an Organized Health Care Education/Training Program | Admitting: Student in an Organized Health Care Education/Training Program

## 2022-12-04 ENCOUNTER — Encounter: Payer: Self-pay | Admitting: Student in an Organized Health Care Education/Training Program

## 2022-12-04 VITALS — BP 128/84 | HR 80 | Temp 97.2°F | Resp 16 | Ht 65.0 in | Wt 115.0 lb

## 2022-12-04 DIAGNOSIS — M961 Postlaminectomy syndrome, not elsewhere classified: Secondary | ICD-10-CM | POA: Insufficient documentation

## 2022-12-04 DIAGNOSIS — M48062 Spinal stenosis, lumbar region with neurogenic claudication: Secondary | ICD-10-CM | POA: Diagnosis not present

## 2022-12-04 DIAGNOSIS — G8929 Other chronic pain: Secondary | ICD-10-CM | POA: Insufficient documentation

## 2022-12-04 DIAGNOSIS — M5416 Radiculopathy, lumbar region: Secondary | ICD-10-CM | POA: Diagnosis not present

## 2022-12-04 DIAGNOSIS — G894 Chronic pain syndrome: Secondary | ICD-10-CM | POA: Diagnosis not present

## 2022-12-04 MED ORDER — HYDROCODONE-ACETAMINOPHEN 7.5-325 MG PO TABS: 1.0000 | ORAL_TABLET | Freq: Four times a day (QID) | ORAL | 0 refills | Status: AC | PRN

## 2022-12-04 NOTE — Progress Notes (Signed)
Nursing Pain Medication Assessment:  Safety precautions to be maintained throughout the outpatient stay will include: orient to surroundings, keep bed in low position, maintain call bell within reach at all times, provide assistance with transfer out of bed and ambulation.  Medication Inspection Compliance: Pill count conducted under aseptic conditions, in front of the patient. Neither the pills nor the bottle was removed from the patient's sight at any time. Once count was completed pills were immediately returned to the patient in their original bottle.  Medication: Hydrocodone/APAP Pill/Patch Count:  0 of 90 pills remain Pill/Patch Appearance: Markings consistent with prescribed medication Bottle Appearance: Standard pharmacy container. Clearly labeled. Filled Date: 7 / 1 / 2024 Last Medication intake:  Day before yesterday

## 2022-12-04 NOTE — Progress Notes (Signed)
PROVIDER NOTE: Information contained herein reflects review and annotations entered in association with encounter. Interpretation of such information and data should be left to medically-trained personnel. Information provided to patient can be located elsewhere in the medical record under "Patient Instructions". Document created using STT-dictation technology, any transcriptional errors that may result from process are unintentional.    Patient: Amy Grant  Service Category: E/M  Provider: Edward Jolly, MD  DOB: [birth year]  DOS: 12/04/2022  Referring Provider: Dana Allan, MD  MRN: [id number]  Specialty: Interventional Pain Management  PCP: Dana Allan, MD  Type: Established Patient  Setting: Ambulatory outpatient    Location: Office  Delivery: Face-to-face     HPI  [name], a 72 y.o. year old female, is here today because of her Chronic radicular lumbar pain [M54.16, G89.29]. Ms. Garczynski primary complain today is Back Pain (Lower and mid) and Neck Pain (left) Last encounter: My last encounter with her was on 09/09/22 Pertinent problems: Ms. Rau has Radiculopathy; Spinal stenosis, lumbar region, with neurogenic claudication; Lumbar degenerative disc disease; Chronic pain syndrome; and Pain management contract signed on their pertinent problem list. Pain Assessment: Severity of Chronic pain is reported as a 6 /10. Location: Back (Also left neck) Lower, Mid/low back pain radiates down left leg to ankle and down right leg to knee. Onset: More than a month ago. Quality: Burning, Jabbing. Timing: Constant. Modifying factor(s): meds. Vitals:  height is 5\' 5"  (1.651 m) and weight is 115 lb (52.2 kg). Her temperature is 97.2 F (36.2 C) (abnormal). Her blood pressure is 128/84 and her pulse is 80. Her respiration is 16 and oxygen saturation is 100%.  BMI: Estimated body mass index is 19.14 kg/m as calculated from the following:   Height as of this encounter: 5\' 5"  (1.651 m).   Weight as of  this encounter: 115 lb (52.2 kg).  Reason for encounter: medication management.   Patient states that her left hand pain is better Improvement in low back and bilateral leg pain compared to last visit Complaining of left neck pain overlying her sternocleidomastoid  Patient continues multimodal pain regimen as prescribed.  States that it provides pain relief and improvement in functional status.   Pharmacotherapy Assessment  Analgesic: Norco 7.5 mg twice daily as needed, usually a quantity of 90 last her 3 months however she states that she has been taking it more frequently, up to twice a day on days that she has pain flares  Monitoring: [place] PMP: PDMP reviewed during this encounter.       Pharmacotherapy: No side-effects or adverse reactions reported. Compliance: No problems identified. Effectiveness: Clinically acceptable.  UDS:  Summary  Date Value Ref Range Status  09/09/2022 Note  Final    Comment:    ==================================================================== [facility] 13 (MW) ==================================================================== Test                             Result       Flag       Units  Drug Present and Declared for Prescription Verification   Hydrocodone                    997          EXPECTED   ng/mg creat   Hydromorphone                  324  EXPECTED   ng/mg creat   Norhydrocodone                 934          EXPECTED   ng/mg creat    Sources of hydrocodone include scheduled prescription medications.    Hydromorphone and norhydrocodone are expected metabolites of    hydrocodone. Hydromorphone is also available as a scheduled    prescription medication.  Drug Present not Declared for Prescription Verification   Carboxy-THC                    34           UNEXPECTED ng/mg creat    Carboxy-THC is a metabolite of tetrahydrocannabinol (THC). Source of    THC is most commonly herbal marijuana or marijuana-based products,    but  THC is also present in a scheduled prescription medication.    Trace amounts of THC can be present in hemp and cannabidiol (CBD)    products. This test is not intended to distinguish between delta-9-    tetrahydrocannabinol, the predominant form of THC in most herbal or    marijuana-based products, and delta-8-tetrahydrocannabinol.  ==================================================================== Test                      Result    Flag   Units      Ref Range   Creatinine              62               mg/dL      >=16 ==================================================================== Declared Medications:  The flagging and interpretation on this report are based on the  following declared medications.  Unexpected results may arise from  inaccuracies in the declared medications.   **Note: The testing scope of this panel includes these medications:   Hydrocodone (Norco)   **Note: The testing scope of this panel does not include the  following reported medications:   Acetaminophen (Norco)  Albuterol (Ventolin HFA)  Calcium  Fluoride (Prevident)  Gabapentin (Neurontin)  Magnesium  Nystatin (Mycostatin)  Tizanidine (Zanaflex)  Verapamil (Calan)  Vitamin D ==================================================================== For clinical consultation, please call [phone]. ====================================================================          ROS  Constitutional: Denies any fever or chills Gastrointestinal: No reported hemesis, hematochezia, vomiting, or acute GI distress Musculoskeletal: Pain overlying left sternocleidomastoid Neurological: No reported episodes of acute onset apraxia, aphasia, dysarthria, agnosia, amnesia, paralysis, loss of coordination, or loss of consciousness  Medication Review  Calcium-Magnesium-Vitamin D, HYDROcodone-acetaminophen, albuterol, gabapentin, tiZANidine, and verapamil  History Review  Allergy: Ms. Cuppett is allergic to  latex, sulfa antibiotics, and linaclotide. Drug: Ms. Wolven  reports no history of drug use. Alcohol:  reports current alcohol use of about 7.0 standard drinks of alcohol per week. Tobacco:  reports that she quit smoking about 11 years ago. Her smoking use included cigarettes. She started smoking about 41 years ago. She has never used smokeless tobacco. Social: Ms. Casco  reports that she quit smoking about 11 years ago. Her smoking use included cigarettes. She started smoking about 41 years ago. She has never used smokeless tobacco. She reports current alcohol use of about 7.0 standard drinks of alcohol per week. She reports that she does not use drugs. Medical:  has a past medical history of Anxiety, Arthritis, Asthma, Blood in stool, Cervical radicular pain (11/16/2019), Chronic intermittent post-traumatic headache, Chronic neck pain with history of  cervical spinal surgery (03/28/2017), Chronic radicular lumbar pain (08/07/2016), Colon polyps, Complication of anesthesia (2018), Depression, Failed back surgical syndrome (12/17/2021), Family history of adverse reaction to anesthesia, GERD (gastroesophageal reflux disease), Head injury, Headache, Hepatitis, Hepatitis C, History of blood transfusion, History of hepatitis C (03/27/2020), History of lumbar surgery (11/21/2019), Hypertension, Laryngopharyngeal reflux (LPR), Multiple falls, Oral lichen planus, Osteoarthritis (03/28/2017), Pancreatitis, Seizures (HCC), and Vitamin D deficiency. Surgical: Ms. Beggs  has a past surgical history that includes Hernia repair; Cesarean section; Exploratory laparotomy; Tonsillectomy; Appendectomy; Colon resection; Colonoscopy; Anterior cervical decomp/discectomy fusion (N/A, 08/07/2016); Breast biopsy (Left); Abdominal hysterectomy (1979); acdf; Transforaminal lumbar interbody fusion (tlif) with pedicle screw fixation 1 level (Right, 03/24/2018); and low back surgery. Family: family history includes Arthritis in her maternal  grandmother and sister; Breast cancer (age of onset: 2) in her sister; Cancer in her maternal grandfather, mother, and sister; Diabetes in her paternal grandmother and sister; Heart disease in her mother; Heart failure in her mother; Hyperlipidemia in her maternal grandmother; Hypertension in her mother; Learning disabilities in her maternal grandmother; Miscarriages / [place] in her daughter, mother, sister, and sister; Multiple sclerosis in her father; Other in her sister.  Laboratory Chemistry Profile   Renal Lab Results  Component Value Date   BUN 10 07/14/2022   CREATININE 1.04 07/14/2022   BCR 12 08/02/2021   GFR 54.67 (L) 07/14/2022   GFRAA >60 03/18/2018   GFRNONAA >60 03/18/2018    Hepatic Lab Results  Component Value Date   AST 22 07/14/2022   ALT 16 07/14/2022   ALBUMIN 4.4 07/14/2022   ALKPHOS 65 07/14/2022    Electrolytes Lab Results  Component Value Date   NA 142 07/14/2022   K 3.9 07/14/2022   CL 103 07/14/2022   CALCIUM 9.5 07/14/2022    Bone Lab Results  Component Value Date   VD25OH 119.53 (HH) 07/14/2022    Inflammation (CRP: Acute Phase) (ESR: Chronic Phase) No results found for: "CRP", "ESRSEDRATE", "LATICACIDVEN"       Note: Above Lab results reviewed.  Recent Imaging Review  MR LUMBAR SPINE W WO CONTRAST CLINICAL DATA:  Lumbar radiculopathy. Chronic mid and lower back pain radiating to the hips and legs, right greater than left.  EXAM: MRI LUMBAR SPINE WITHOUT AND WITH CONTRAST  TECHNIQUE: Multiplanar and multiecho pulse sequences of the lumbar spine were obtained without and with intravenous contrast.  CONTRAST:  6 mL Vueway  COMPARISON:  Lumbar spine MRI 11/01/2020  FINDINGS: Segmentation:  Standard.  Alignment:  Unchanged 5 mm anterolisthesis of L4 on L5.  Vertebrae: No fracture, suspicious marrow lesion, or evidence of discitis. Prior posterior and interbody fusion at L4-5 with persistent mild degenerative endplate edema at  this level.  Conus medullaris and cauda equina: Conus extends to the L1-2 level. Conus and cauda equina appear normal.  Paraspinal and other soft tissues: Postoperative changes in the posterior lower lumbar soft tissues. No fluid collection.  Disc levels:  T12-L1: Mild facet hypertrophy without disc herniation or stenosis, unchanged.  L1-2: Mild facet hypertrophy without disc herniation or stenosis, unchanged.  L2-3: Minimal disc bulging and moderate facet and ligamentum flavum hypertrophy without stenosis, unchanged.  L3-4: Disc bulging and severe facet and ligamentum flavum hypertrophy result in borderline spinal and neural foraminal stenosis, unchanged.  L4-5: Prior fusion. Anterolisthesis with bulging uncovered disc and facet and ligamentum flavum hypertrophy result in mild spinal stenosis and moderate right and mild left neural foraminal stenosis, unchanged.  L5-S1: Severe disc space narrowing. Disc bulging,  endplate spurring, a chronic left foraminal disc extrusion, and moderate facet hypertrophy result in mild-to-moderate right and moderate to severe left neural foraminal stenosis without spinal stenosis. The disc extrusion appears smaller than on the prior study with mildly improved left foraminal stenosis, although there is still potential left L5 nerve root impingement.  IMPRESSION: 1. Chronic left foraminal disc extrusion at L5-S1 with slight improvement of moderate to severe foraminal stenosis. 2. Unchanged disc and facet degeneration elsewhere. Mild spinal stenosis and moderate right and mild left neural foraminal stenosis at the fused L4-5 level.  Electronically Signed   By: Sebastian Ache M.D.   On: 04/16/2022 08:10 MR THORACIC SPINE WO CONTRAST CLINICAL DATA:  Osteoarthritis, thoracic. Chronic mid and lower back pain radiating to the hips and legs, right greater than left.  EXAM: MRI THORACIC SPINE WITHOUT CONTRAST  TECHNIQUE: Multiplanar,  multisequence MR imaging of the thoracic spine was performed. No intravenous contrast was administered.  COMPARISON:  Cervical spine MRI 12/02/2019  FINDINGS: Alignment:  Normal.  Vertebrae: No fracture, suspicious marrow lesion, or evidence of discitis. Limited assessment of the cervical spine on large field-of-view sagittal images again demonstrates prior C4-5 ACDF and advanced disc degeneration from C5-6 through C7-T1 as shown on the prior cervical spine MRI.  Cord:  Normal signal and morphology.  Paraspinal and other soft tissues: Unremarkable.  Disc levels:  At C7-T1, disc bulging, uncovertebral spurring, and facet arthrosis result in moderate right and mild left neural foraminal stenosis, similar to the prior cervical spine MRI. No spinal stenosis.  There is minor disc bulging at T1-2 and T2-3 without stenosis. There is multilevel facet arthrosis which is moderate at T3-4 and T11-12. No significant neural foraminal stenosis.  IMPRESSION: 1. Mild thoracic spondylosis and mild-to-moderate facet arthrosis without spinal stenosis. 2. Moderate right and mild left neural foraminal stenosis at C7-T1. 3.  Electronically Signed   By: Sebastian Ache M.D.   On: 04/16/2022 07:54 Note: Reviewed        Physical Exam  General appearance: Well nourished, well developed, and well hydrated. In no apparent acute distress Mental status: Alert, oriented x 3 (person, place, & time)       Respiratory: No evidence of acute respiratory distress Eyes: PERLA Vitals: BP 128/84   Pulse 80   Temp (!) 97.2 F (36.2 C)   Resp 16   Ht 5\' 5"  (1.651 m)   Wt 115 lb (52.2 kg)   SpO2 100%   BMI 19.14 kg/m  BMI: Estimated body mass index is 19.14 kg/m as calculated from the following:   Height as of this encounter: 5\' 5"  (1.651 m).   Weight as of this encounter: 115 lb (52.2 kg). Ideal: Ideal body weight: 57 kg (125 lb 10.6 oz)  Pain overlying left sternocleidomastoid  Lumbar Spine Area  Exam  Skin & Axial Inspection: Well healed scar from previous spine surgery detected Alignment: Symmetrical Functional ROM: Pain restricted ROM affecting both sides Stability: No instability detected Muscle Tone/Strength: Functionally intact. No obvious neuro-muscular anomalies detected. Sensory (Neurological): Dermatomal pain pattern L4/5 (left) Palpation: No palpable anomalies         Lower Extremity Exam      Side: Right lower extremity   Side: Left lower extremity  Stability: No instability observed           Stability: No instability observed          Skin & Extremity Inspection: Skin color, temperature, and hair growth are WNL. No peripheral edema or  cyanosis. No masses, redness, swelling, asymmetry, or associated skin lesions. No contractures.   Skin & Extremity Inspection: Skin color, temperature, and hair growth are WNL. No peripheral edema or cyanosis. No masses, redness, swelling, asymmetry, or associated skin lesions. No contractures.  Functional ROM: Unrestricted ROM                   Functional ROM: Unrestricted ROM                  Muscle Tone/Strength: Functionally intact. No obvious neuro-muscular anomalies detected.   Muscle Tone/Strength: Functionally intact. No obvious neuro-muscular anomalies detected.  Sensory (Neurological): Dermatomal pain pattern         Sensory (Neurological): Dermatomal pain pattern        DTR: Patellar: deferred today Achilles: deferred today Plantar: deferred today   DTR: Patellar: deferred today Achilles: deferred today Plantar: deferred today  Palpation: No palpable anomalies   Palpation: No palpable anomalies     Assessment   Diagnosis Status  1. Chronic radicular lumbar pain   2. Spinal stenosis, lumbar region, with neurogenic claudication   3. Post laminectomy syndrome   4. Failed back surgical syndrome   5. Chronic pain syndrome      Controlled Controlled Controlled     Plan of Care   1. Chronic radicular lumbar pain -  HYDROcodone-acetaminophen (NORCO) 7.5-325 MG tablet; Take 1 tablet by mouth every 6 (six) hours as needed for moderate pain.  Dispense: 120 tablet; Refill: 0  2. Spinal stenosis, lumbar region, with neurogenic claudication - HYDROcodone-acetaminophen (NORCO) 7.5-325 MG tablet; Take 1 tablet by mouth every 6 (six) hours as needed for moderate pain.  Dispense: 120 tablet; Refill: 0  3. Post laminectomy syndrome - HYDROcodone-acetaminophen (NORCO) 7.5-325 MG tablet; Take 1 tablet by mouth every 6 (six) hours as needed for moderate pain.  Dispense: 120 tablet; Refill: 0  4. Failed back surgical syndrome - HYDROcodone-acetaminophen (NORCO) 7.5-325 MG tablet; Take 1 tablet by mouth every 6 (six) hours as needed for moderate pain.  Dispense: 120 tablet; Refill: 0  5. Chronic pain syndrome - HYDROcodone-acetaminophen (NORCO) 7.5-325 MG tablet; Take 1 tablet by mouth every 6 (six) hours as needed for moderate pain.  Dispense: 120 tablet; Refill: 0   Requested Prescriptions   Signed Prescriptions Disp Refills   HYDROcodone-acetaminophen (NORCO) 7.5-325 MG tablet 120 tablet 0    Sig: Take 1 tablet by mouth every 6 (six) hours as needed for moderate pain.   I have discussed spinal injections (L-TF ESI) as well as a SCS trial.  Continue Gabapentin as prescribed Continue Baclofen prn   Follow-up plan:   Return for patient will call to schedule F2F appt prn.     Recent Visits Date Type Provider Dept  09/09/22 Office Visit Edward Jolly, MD [facility]  Showing recent visits within past 90 days and meeting all other requirements Today's Visits Date Type Provider Dept  12/04/22 Office Visit Edward Jolly, MD [facility]  Showing today's visits and meeting all other requirements Future Appointments Date Type Provider Dept  03/03/23 Appointment Edward Jolly, MD [facility]  Showing future appointments within next 90 days and meeting all other requirements  I  discussed the assessment and treatment plan with the patient. The patient was provided an opportunity to ask questions and all were answered. The patient agreed with the plan and demonstrated an understanding of the instructions.  Patient advised to call back or seek an in-person evaluation if  the symptoms or condition worsens.  Duration of encounter: .  Total time on encounter, as per AMA guidelines included both the face-to-face and non-face-to-face time personally spent by the physician and/or other qualified health care professional(s) on the day of the encounter (includes time in activities that require the physician or other qualified health care professional and does not include time in activities normally performed by clinical staff). Physician's time may include the following activities when performed: Preparing to see the patient (e.g., pre-charting review of records, searching for previously ordered imaging, lab work, and nerve conduction tests) Review of prior analgesic pharmacotherapies. Reviewing PMP Interpreting ordered tests (e.g., lab work, imaging, nerve conduction tests) Performing post-procedure evaluations, including interpretation of diagnostic procedures Obtaining and/or reviewing separately obtained history Performing a medically appropriate examination and/or evaluation Counseling and educating the patient/family/caregiver Ordering medications, tests, or procedures Referring and communicating with other health care professionals (when not separately reported) Documenting clinical information in the electronic or other health record Independently interpreting results (not separately reported) and communicating results to the patient/ family/caregiver Care coordination (not separately reported)  Note by: Edward Jolly, MD Date: 12/04/2022; Time: 3:50 PM

## 2022-12-14 IMAGING — DX DG CHEST 2V
2 series · 2 of 2 positions shown · non-contrast
Comparison: 08/01/2016

CLINICAL DATA: Chest pain

EXAM:
CHEST - 2 VIEW

[chest pa]
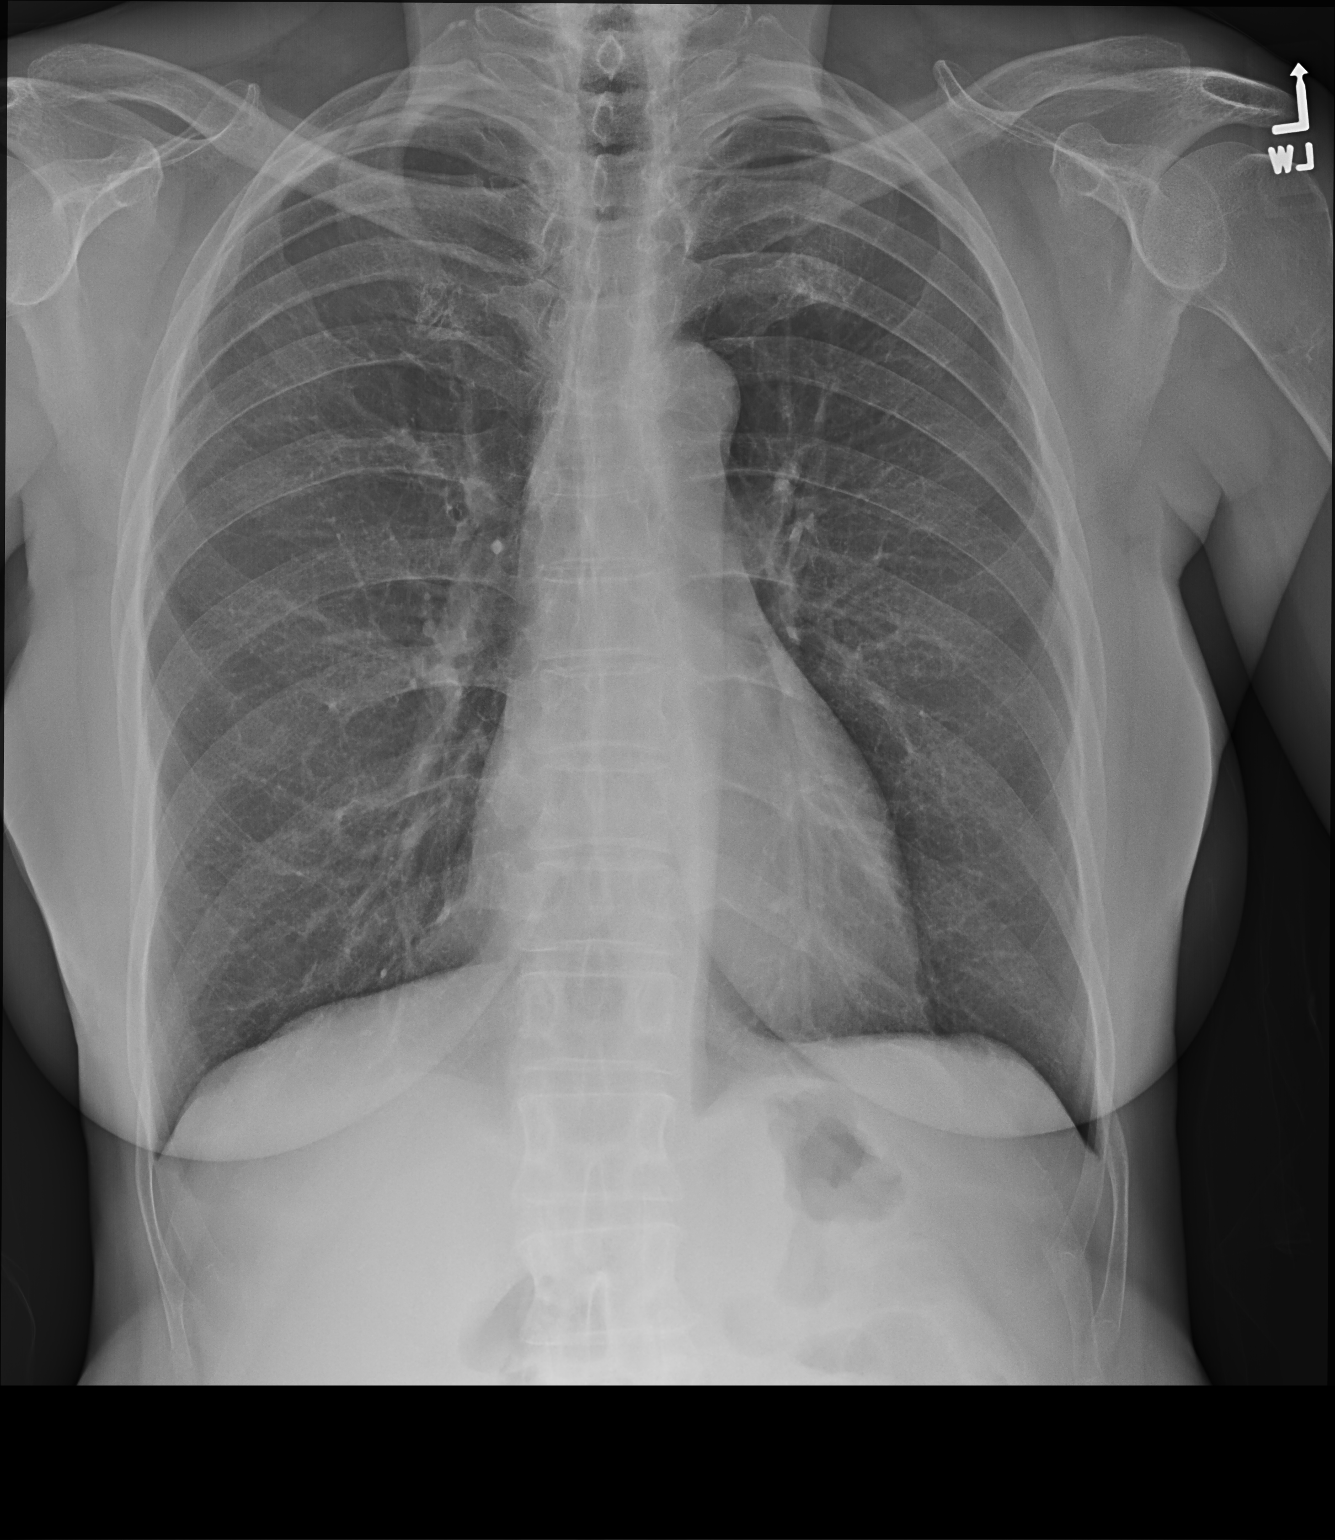

[chest lat]
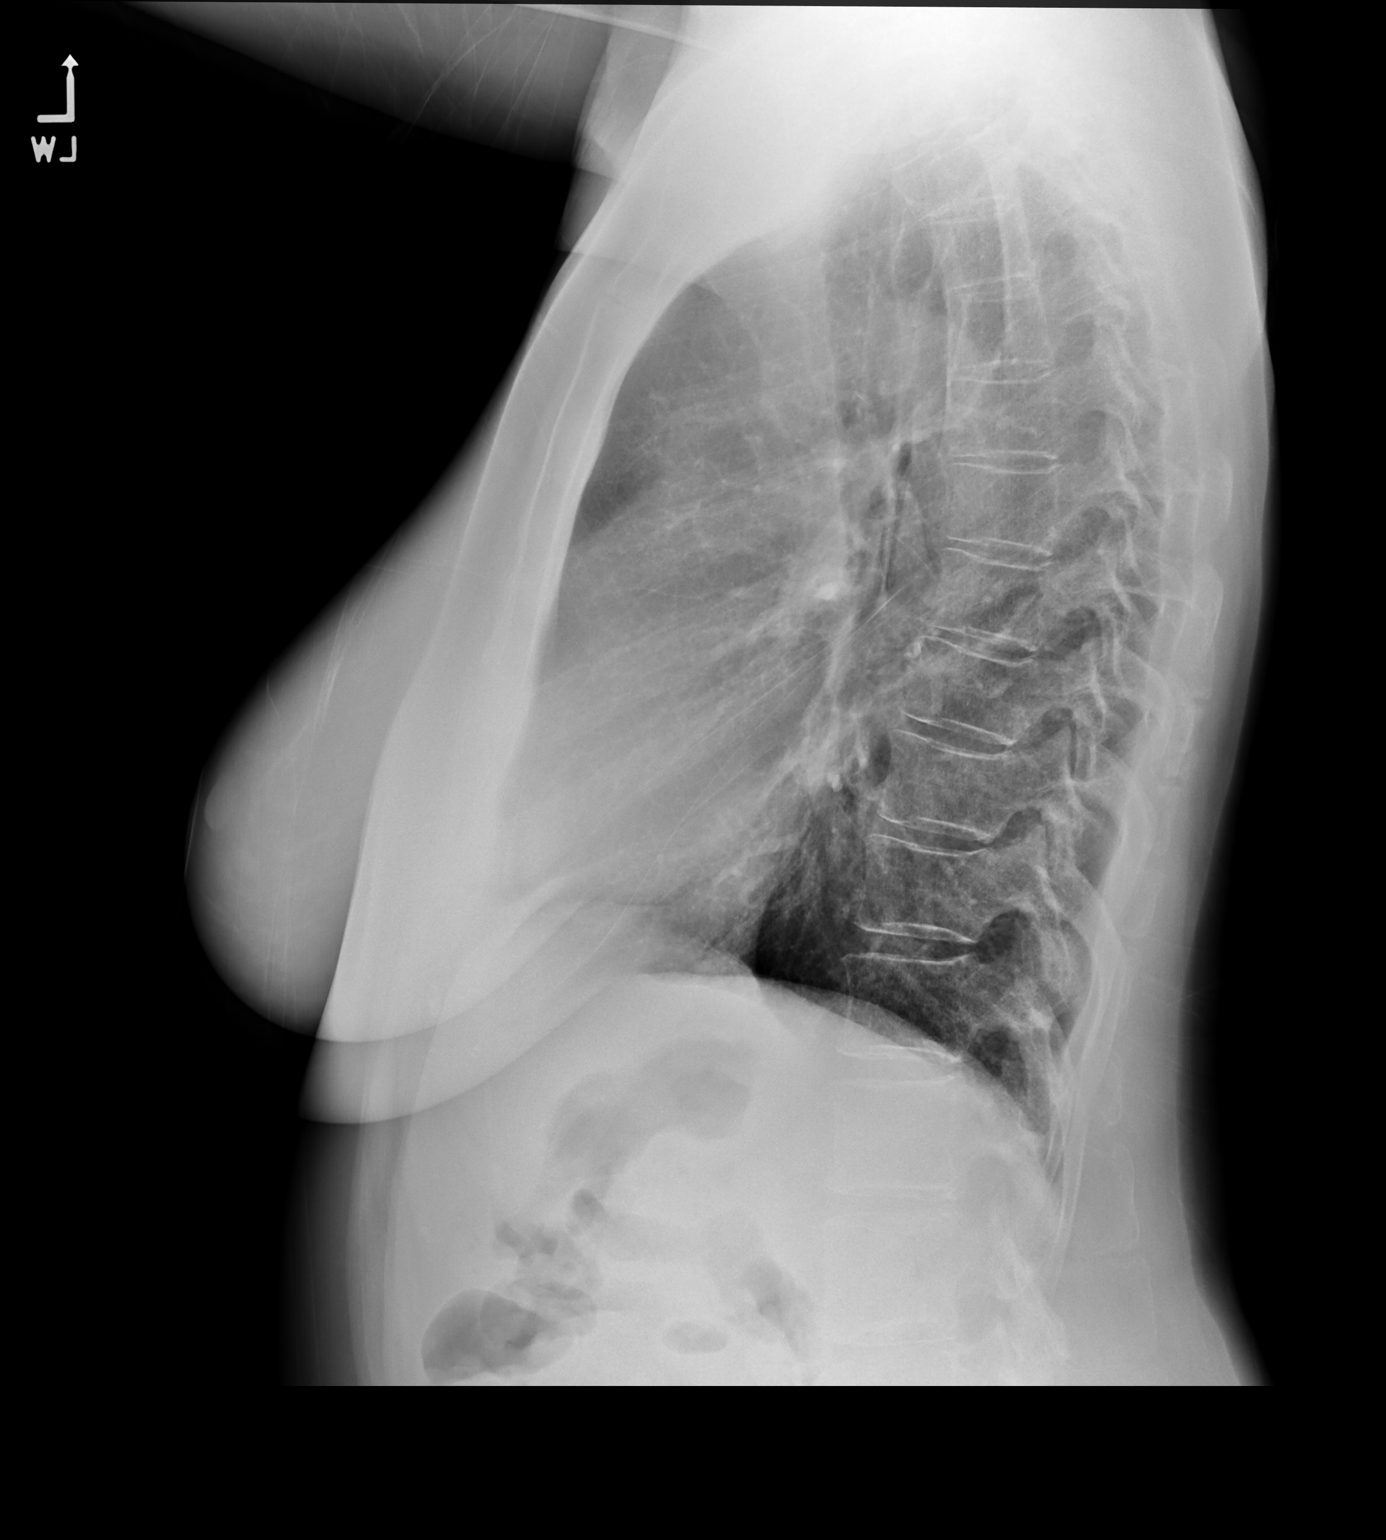

[2 of 2 positions shown; findings below may reference images not displayed]

FINDINGS: The heart size and mediastinal contours are within normal limits.
Both lungs are clear. The visualized skeletal structures are
unremarkable.
IMPRESSION: No active cardiopulmonary disease.

## 2022-12-17 ENCOUNTER — Ambulatory Visit (INDEPENDENT_AMBULATORY_CARE_PROVIDER_SITE_OTHER): Payer: Medicare Other | Admitting: Gastroenterology

## 2022-12-17 ENCOUNTER — Telehealth: Payer: Self-pay | Admitting: Gastroenterology

## 2022-12-17 VITALS — BP 125/69 | HR 74 | Temp 98.1°F | Ht 65.0 in | Wt 116.0 lb

## 2022-12-17 DIAGNOSIS — R634 Abnormal weight loss: Secondary | ICD-10-CM | POA: Diagnosis not present

## 2022-12-17 DIAGNOSIS — R194 Change in bowel habit: Secondary | ICD-10-CM

## 2022-12-17 NOTE — Progress Notes (Signed)
Primary Care Physician: Dana Allan, MD  Primary Gastroenterologist:  Dr. Midge Minium  Chief Complaint  Patient presents with   New Patient (Initial Visit)    reestablish   Weight Loss   Gastroesophageal Reflux    HPI: Amy Grant is a 72 y.o. female here for GERD and weight loss.  This patient has seen me in the past for hepatitis C.  It appears that the patient had a colonoscopy at an outside institution back in 2015.  The patient had hepatitis C genotype Ia which was treated in 2019 with a negative viral load in 2021. She was 72 back in 2021 and how comes in at 89.  The patient reports that she has episodes where she eats and has to run to the bathroom.  She reported that yesterday was 1 of those days when she had an episode.  She states that she had some hard stools and then after that had to run to the bathroom quite frequently.  She also reports that she has had multiple abdominal surgeries.  Her last colonoscopy by Dr. Shelle Iron was incomplete and the patient was sent for a barium enema.  There is no report of any nausea vomiting.  Past Medical History:  Diagnosis Date   Anxiety    Arthritis    right hand, DDD L 4 , L 5   Asthma    Blood in stool    Cervical radicular pain 11/16/2019   Chronic intermittent post-traumatic headache    Chronic neck pain with history of cervical spinal surgery 03/28/2017   S/p surgery Dr. Yevette Edwards   Chronic radicular lumbar pain 08/07/2016   Reviewed MRI lumbar 04/2016 degenerative changes worse L5-S1 disc desiccation with ht loss, b/l facet effusion, synovial cysts, mild facet arthropathy, disc bulging and arthropathy at some areas marked also marked right neural foraminal narrowing with compression of exiting nerve rool L4/5, L5/S1 disc bulge and b/l foraminal extension of marked facet arthropathy>>>severe b/l neural foraminal stenos   Colon polyps    Complication of anesthesia 2018   difficulty waking up    Depression    Failed back  surgical syndrome 12/17/2021   Family history of adverse reaction to anesthesia    Sister woke up during surgery   GERD (gastroesophageal reflux disease)    Head injury    Headache    h/o migraines    Hepatitis    Hepatitis C    treated with mavyret per GI   History of blood transfusion    History of hepatitis C 03/27/2020   History of lumbar surgery 11/21/2019   Hypertension    Laryngopharyngeal reflux (LPR)    Multiple falls    Oral lichen planus    Osteoarthritis 03/28/2017   Pancreatitis    remote hx w/o h/o drinking at that time   Seizures (HCC)     after head injury   Vitamin D deficiency     Current Outpatient Medications  Medication Sig Dispense Refill   albuterol (VENTOLIN HFA) 108 (90 Base) MCG/ACT inhaler Inhale 1-2 puffs into the lungs every 6 (six) hours as needed for wheezing or shortness of breath. 18 g 11   Calcium-Magnesium-Vitamin D (CALCIUM 1200+D3 PO) Take 2 tablets by mouth daily.     gabapentin (NEURONTIN) 300 MG capsule TAKE 2 CAPSULES(600 MG) BY MOUTH TWICE DAILY 240 capsule 3   HYDROcodone-acetaminophen (NORCO) 7.5-325 MG tablet Take 1 tablet by mouth every 6 (six) hours as needed for moderate pain. 120 tablet  0   tiZANidine (ZANAFLEX) 4 MG tablet Take 1 tablet (4 mg total) by mouth at bedtime. 30 tablet 3   verapamil (CALAN-SR) 240 MG CR tablet TAKE 1 TABLET(240 MG) BY MOUTH AT BEDTIME 90 tablet 3   No current facility-administered medications for this visit.    Allergies as of 12/17/2022 - Review Complete 12/17/2022  Allergen Reaction Noted   Latex Hives 07/29/2016   Sulfa antibiotics Hives 08/23/2013   Linaclotide Itching 11/21/2013    ROS:  General: Negative for anorexia, weight loss, fever, chills, fatigue, weakness. ENT: Negative for hoarseness, difficulty swallowing , nasal congestion. CV: Negative for chest pain, angina, palpitations, dyspnea on exertion, peripheral edema.  Respiratory: Negative for dyspnea at rest, dyspnea on  exertion, cough, sputum, wheezing.  GI: See history of present illness. GU:  Negative for dysuria, hematuria, urinary incontinence, urinary frequency, nocturnal urination.  Endo: Negative for unusual weight change.    Physical Examination:   BP 125/69 (BP Location: Left Arm, Patient Position: Sitting, Cuff Size: Normal)   Pulse 74   Temp 98.1 F (36.7 C) (Oral)   Ht 5\' 5"  (1.651 m)   Wt 116 lb (52.6 kg)   BMI 19.30 kg/m   General: Well-nourished, well-developed in no acute distress.  Eyes: No icterus. Conjunctivae pink. Lungs: Clear to auscultation bilaterally. Non-labored. Heart: Regular rate and rhythm, no murmurs rubs or gallops.  Abdomen: Bowel sounds are normal, diffusely tender, nondistended, no hepatosplenomegaly or masses, no abdominal bruits or hernia , no rebound or guarding.   Extremities: No lower extremity edema. No clubbing or deformities. Neuro: Alert and oriented x 3.  Grossly intact. Skin: Warm and dry, no jaundice.   Psych: Alert and cooperative, normal mood and affect.  Labs:    Imaging Studies: No results found.  Assessment and Plan:   Amy Grant is a 72 y.o. y/o female who comes in today with a history of unexplained weight loss and she has not had a colonoscopy in many years.  The patient also has a had a change in bowel habits in addition to weight loss.  She does endorse some dark hard stools at times.  The patient will be set up for an EGD and colonoscopy to look for source of her unexplained weight loss and change in bowel habits.  If the EGD and colonoscopy is unremarkable then she may need a CT scan of the abdomen pelvis to rule out any extraintestinal abnormalities as the cause of her symptoms.  The patient has been explained the plan and agrees with it.     Midge Minium, MD. Clementeen Graham    Note: This dictation was prepared with Dragon dictation along with smaller phrase technology. Any transcriptional errors that result from this process are  unintentional.

## 2022-12-17 NOTE — Telephone Encounter (Signed)
Patient requesting name of throat procedure.

## 2023-01-01 ENCOUNTER — Other Ambulatory Visit: Payer: Self-pay | Admitting: Family Medicine

## 2023-01-01 DIAGNOSIS — M5410 Radiculopathy, site unspecified: Secondary | ICD-10-CM

## 2023-01-06 MED ORDER — NA SULFATE-K SULFATE-MG SULF 17.5-3.13-1.6 GM/177ML PO SOLN: 1.0000 | Freq: Once | ORAL | 0 refills | Status: AC

## 2023-01-06 NOTE — Addendum Note (Signed)
Addended by: Roena Malady on: 01/06/2023 03:19 PM   Modules accepted: Orders

## 2023-01-08 ENCOUNTER — Encounter: Payer: Self-pay | Admitting: Gastroenterology

## 2023-01-15 ENCOUNTER — Ambulatory Visit
Admission: RE | Admit: 2023-01-15 | Discharge: 2023-01-15 | Disposition: A | Discharge status: Home / Self Care | Payer: Medicare Other | Attending: Gastroenterology | Admitting: Gastroenterology

## 2023-01-15 ENCOUNTER — Ambulatory Visit: Payer: Medicare Other | Admitting: Anesthesiology

## 2023-01-15 ENCOUNTER — Encounter
Admission: RE | Disposition: A | Discharge status: Home / Self Care | Payer: Self-pay | Source: Home / Self Care | Attending: Gastroenterology

## 2023-01-15 DIAGNOSIS — K449 Diaphragmatic hernia without obstruction or gangrene: Secondary | ICD-10-CM | POA: Diagnosis not present

## 2023-01-15 DIAGNOSIS — D125 Benign neoplasm of sigmoid colon: Secondary | ICD-10-CM | POA: Diagnosis not present

## 2023-01-15 DIAGNOSIS — R634 Abnormal weight loss: Secondary | ICD-10-CM | POA: Diagnosis not present

## 2023-01-15 DIAGNOSIS — Z87891 Personal history of nicotine dependence: Secondary | ICD-10-CM | POA: Diagnosis not present

## 2023-01-15 DIAGNOSIS — K2960 Other gastritis without bleeding: Secondary | ICD-10-CM | POA: Insufficient documentation

## 2023-01-15 DIAGNOSIS — K2950 Unspecified chronic gastritis without bleeding: Secondary | ICD-10-CM | POA: Diagnosis not present

## 2023-01-15 DIAGNOSIS — M1990 Unspecified osteoarthritis, unspecified site: Secondary | ICD-10-CM | POA: Diagnosis not present

## 2023-01-15 DIAGNOSIS — Z79899 Other long term (current) drug therapy: Secondary | ICD-10-CM | POA: Diagnosis not present

## 2023-01-15 DIAGNOSIS — K5900 Constipation, unspecified: Secondary | ICD-10-CM

## 2023-01-15 DIAGNOSIS — Z681 Body mass index (BMI) 19 or less, adult: Secondary | ICD-10-CM | POA: Diagnosis not present

## 2023-01-15 DIAGNOSIS — Z9889 Other specified postprocedural states: Secondary | ICD-10-CM | POA: Insufficient documentation

## 2023-01-15 DIAGNOSIS — K2970 Gastritis, unspecified, without bleeding: Secondary | ICD-10-CM

## 2023-01-15 DIAGNOSIS — K219 Gastro-esophageal reflux disease without esophagitis: Secondary | ICD-10-CM | POA: Diagnosis not present

## 2023-01-15 DIAGNOSIS — K635 Polyp of colon: Secondary | ICD-10-CM

## 2023-01-15 DIAGNOSIS — J45909 Unspecified asthma, uncomplicated: Secondary | ICD-10-CM | POA: Insufficient documentation

## 2023-01-15 DIAGNOSIS — R194 Change in bowel habit: Secondary | ICD-10-CM | POA: Diagnosis not present

## 2023-01-15 DIAGNOSIS — I10 Essential (primary) hypertension: Secondary | ICD-10-CM | POA: Diagnosis not present

## 2023-01-15 DIAGNOSIS — Z90710 Acquired absence of both cervix and uterus: Secondary | ICD-10-CM | POA: Diagnosis not present

## 2023-01-15 DIAGNOSIS — K640 First degree hemorrhoids: Secondary | ICD-10-CM

## 2023-01-15 HISTORY — PX: COLONOSCOPY WITH PROPOFOL: SHX5780

## 2023-01-15 HISTORY — PX: BIOPSY: SHX5522

## 2023-01-15 HISTORY — PX: ESOPHAGOGASTRODUODENOSCOPY (EGD) WITH PROPOFOL: SHX5813

## 2023-01-15 HISTORY — PX: POLYPECTOMY: SHX5525

## 2023-01-15 SURGERY — COLONOSCOPY WITH PROPOFOL
Anesthesia: General

## 2023-01-15 MED ORDER — DEXMEDETOMIDINE HCL IN NACL 80 MCG/20ML IV SOLN
INTRAVENOUS | Status: AC
  Filled 2023-01-15: qty 20

## 2023-01-15 MED ORDER — PROPOFOL 500 MG/50ML IV EMUL
INTRAVENOUS | Status: DC | PRN
  Administered 2023-01-15: 150 ug/kg/min via INTRAVENOUS

## 2023-01-15 MED ORDER — EPHEDRINE 5 MG/ML INJ
INTRAVENOUS | Status: AC
  Filled 2023-01-15: qty 5

## 2023-01-15 MED ORDER — LIDOCAINE HCL (PF) 2 % IJ SOLN
INTRAMUSCULAR | Status: AC
  Filled 2023-01-15: qty 5

## 2023-01-15 MED ORDER — PROPOFOL 1000 MG/100ML IV EMUL
INTRAVENOUS | Status: AC
  Filled 2023-01-15: qty 100

## 2023-01-15 MED ORDER — DEXMEDETOMIDINE HCL IN NACL 200 MCG/50ML IV SOLN
INTRAVENOUS | Status: DC | PRN
Start: 2023-01-15 — End: 2023-01-15
  Administered 2023-01-15 (×2): 20 ug via INTRAVENOUS

## 2023-01-15 MED ORDER — LIDOCAINE HCL (CARDIAC) PF 100 MG/5ML IV SOSY
PREFILLED_SYRINGE | INTRAVENOUS | Status: DC | PRN
  Administered 2023-01-15: 50 mg via INTRAVENOUS

## 2023-01-15 MED ORDER — SODIUM CHLORIDE 0.9 % IV SOLN: INTRAVENOUS | Status: DC

## 2023-01-15 MED ORDER — DEXMEDETOMIDINE HCL IN NACL 80 MCG/20ML IV SOLN
INTRAVENOUS | Status: AC
Start: 2023-01-15 — End: ?
  Filled 2023-01-15: qty 20

## 2023-01-15 MED ORDER — PROPOFOL 10 MG/ML IV BOLUS
INTRAVENOUS | Status: DC | PRN
  Administered 2023-01-15: 50 mg via INTRAVENOUS

## 2023-01-15 NOTE — H&P (Signed)
Midge Minium, MD [facility] [address] [place], [place] [address] [phone] Fax : [phone]  Primary Care Physician:  Dana Allan, MD Primary Gastroenterologist:  Dr. Servando Snare  Pre-Procedure History & Physical: HPI:  Amy Grant is a 72 y.o. female is here for an endoscopy and colonoscopy.   Past Medical History:  Diagnosis Date   Anxiety    Arthritis    right hand, DDD L 4 , L 5   Asthma    Blood in stool    Cervical radicular pain 11/16/2019   Chronic intermittent post-traumatic headache    Chronic neck pain with history of cervical spinal surgery 03/28/2017   S/p surgery Dr. Yevette Edwards   Chronic radicular lumbar pain 08/07/2016   Reviewed MRI lumbar 04/2016 degenerative changes worse L5-S1 disc desiccation with ht loss, b/l facet effusion, synovial cysts, mild facet arthropathy, disc bulging and arthropathy at some areas marked also marked right neural foraminal narrowing with compression of exiting nerve rool L4/5, L5/S1 disc bulge and b/l foraminal extension of marked facet arthropathy>>>severe b/l neural foraminal stenos   Colon polyps    Complication of anesthesia 2018   difficulty waking up    Depression    Failed back surgical syndrome 12/17/2021   Family history of adverse reaction to anesthesia    Sister woke up during surgery   GERD (gastroesophageal reflux disease)    Head injury    Headache    h/o migraines    Hepatitis    Hepatitis C    treated with mavyret per GI   History of blood transfusion    History of hepatitis C 03/27/2020   History of lumbar surgery 11/21/2019   Hypertension    Laryngopharyngeal reflux (LPR)    Multiple falls    Oral lichen planus    Osteoarthritis 03/28/2017   Pancreatitis    remote hx w/o h/o drinking at that time   Seizures (HCC)     after head injury   Vitamin D deficiency     Past Surgical History:  Procedure Laterality Date   ABDOMINAL HYSTERECTOMY  1979   has an ovary left s/p hysterectomy 1979;  h/o abnormal pap    acdf     08/07/16 Dr. Yevette Edwards    ANTERIOR CERVICAL DECOMP/DISCECTOMY FUSION N/A 08/07/2016   Procedure: ANTERIOR CERVICAL DECOMPRESSION FUSION, CERVICAL FOUR-FIVE  WITH INSTRUMENTATION AND ALLOGRAFT;  Surgeon: Estill Bamberg, MD;  Location: [facility] OR;  Service: Orthopedics;  Laterality: N/A;   APPENDECTOMY     BREAST BIOPSY Left    over 20 years ago negative left breast as of 03/2020    CESAREAN SECTION     COLON RESECTION     paritial ? reason done in [place]    COLONOSCOPY     EXPLORATORY LAPAROTOMY     after C Section  x 2 - 1 for bleeding and 1 for infection   HERNIA REPAIR     Inguinal - as a child   low back surgery     Dr. Yevette Edwards    TONSILLECTOMY     TRANSFORAMINAL LUMBAR INTERBODY FUSION (TLIF) WITH PEDICLE SCREW FIXATION 1 LEVEL Right 03/24/2018   Procedure: RIGHT LUMBAR 4 - LUMBAR 5 TRANSFORAMINAL LUMBAR INTERBODY FUSION WITH INSTRUMENTATION AND ALLOGRAFT;  Surgeon: Estill Bamberg, MD;  Location: [facility] OR;  Service: Orthopedics;  Laterality: Right;    Prior to Admission medications   Medication Sig Start Date End Date Taking? Authorizing Provider  albuterol (VENTOLIN HFA) 108 (90 Base) MCG/ACT inhaler Inhale 1-2  puffs into the lungs every 6 (six) hours as needed for wheezing or shortness of breath. 10/22/21   McLean-Scocuzza, Pasty Spillers, MD  Calcium-Magnesium-Vitamin D (CALCIUM 1200+D3 PO) Take 2 tablets by mouth daily. 03/22/22   [provider]  gabapentin (NEURONTIN) 300 MG capsule TAKE 2 CAPSULES(600 MG) BY MOUTH TWICE DAILY 11/07/22   Dana Allan, MD  tiZANidine (ZANAFLEX) 4 MG tablet TAKE 1 TABLET(4 MG) BY MOUTH AT BEDTIME 01/02/23   Dana Allan, MD  verapamil (CALAN-SR) 240 MG CR tablet TAKE 1 TABLET(240 MG) BY MOUTH AT BEDTIME 10/22/21   McLean-Scocuzza, Pasty Spillers, MD    Allergies as of 01/06/2023 - Review Complete 12/17/2022  Allergen Reaction Noted   Latex Hives 07/29/2016   Sulfa antibiotics Hives 08/23/2013   Linaclotide Itching 11/21/2013     Family History  Problem Relation Age of Onset   Breast cancer Sister 39   Cancer Sister        breast    Diabetes Sister    Miscarriages / [place] Sister    Cancer Mother        breast/precancer changes   Heart disease Mother    Hypertension Mother    Miscarriages / Stillbirths Mother    Heart failure Mother    Multiple sclerosis Father    Miscarriages / [place] Daughter    Arthritis Maternal Grandmother    Hyperlipidemia Maternal Grandmother    Learning disabilities Maternal Grandmother    Cancer Maternal Grandfather    Diabetes Paternal Grandmother    Arthritis Sister    Miscarriages / [place] Sister    Other Sister        precancerous changes breast    Social History   Socioeconomic History   Marital status: Divorced    Spouse name: Not on file   Number of children: Not on file   Years of education: Not on file   Highest education level: Associate degree: occupational, Scientist, product/process development, or vocational program  Occupational History   Not on file  Tobacco Use   Smoking status: Former    Current packs/day: 0.00    Types: Cigarettes    Start date: 61    Quit date: 2013    Years since quitting: 11.8   Smokeless tobacco: Never  Vaping Use   Vaping status: Never Used  Substance and Sexual Activity   Alcohol use: Yes    Alcohol/week: 7.0 standard drinks of alcohol    Types: 7 Glasses of wine per week    Comment: per week   Drug use: No   Sexual activity: Not on file  Other Topics Concern   Not on file  Social History Narrative   ** Merged History Encounter **       Associate degree  Wears seat belt, feels safe in marriage to Ron 1 daughter    Social Determinants of Health   Financial Resource Strain: Low Risk  (11/21/2022)   Overall Financial Resource Strain (CARDIA)    Difficulty of Paying Living Expenses: Not very hard  Food Insecurity: No Food Insecurity (11/21/2022)   Hunger Vital Sign    Worried About Running Out of Food in the Last Year:  Never true    Ran Out of Food in the Last Year: Never true  Transportation Needs: No Transportation Needs (11/21/2022)   PRAPARE - Administrator, Civil Service (Medical): No    Lack of Transportation (Non-Medical): No  Physical Activity: Insufficiently Active (11/21/2022)   Exercise Vital Sign    Days of  Exercise per Week: 2 days    Minutes of Exercise per Session: 20 min  Stress: Stress Concern Present (11/21/2022)   [employer] of Occupational Health - Occupational Stress Questionnaire    Feeling of Stress : To some extent  Social Connections: Moderately Isolated (11/21/2022)   Social Connection and Isolation Panel [NHANES]    Frequency of Communication with Friends and Family: More than three times a week    Frequency of Social Gatherings with Friends and Family: Once a week    Attends Religious Services: More than 4 times per year    Active Member of [employer] or Organizations: No    Attends Banker Meetings: Never    Marital Status: Divorced  Catering manager Violence: Not At Risk (11/25/2022)   Humiliation, Afraid, Rape, and Kick questionnaire    Fear of Current or Ex-Partner: No    Emotionally Abused: No    Physically Abused: No    Sexually Abused: No    Review of Systems: See HPI, otherwise negative ROS  Physical Exam: Ht 5\' 5"  (1.651 m)   Wt 51.7 kg   BMI 18.97 kg/m  General:   Alert,  pleasant and cooperative in NAD Head:  Normocephalic and atraumatic. Neck:  Supple; no masses or thyromegaly. Lungs:  Clear throughout to auscultation.    Heart:  Regular rate and rhythm. Abdomen:  Soft, nontender and nondistended. Normal bowel sounds, without guarding, and without rebound.   Neurologic:  Alert and  oriented x4;  grossly normal neurologically.  Impression/Plan: [name] is here for an endoscopy and colonoscopy to be performed for weight loss and change in bowel habits  Risks, benefits, limitations, and alternatives regarding   endoscopy and colonoscopy have been reviewed with the patient.  Questions have been answered.  All parties agreeable.   Midge Minium, MD  01/15/2023, 8:44 AM

## 2023-01-15 NOTE — Op Note (Signed)
[facility] Gastroenterology Patient Name: Amy Grant Procedure Date: 01/15/2023 8:45 AM MRN: [id number] Account #: [id number] Date of Birth: 17-Apr-1950 Admit Type: Outpatient Age: 72 Room: [facility] ENDO ROOM 4 Gender: Female Note Status: Finalized Instrument Name: Upper Endoscope [id number] Procedure:             Upper GI endoscopy Indications:           Weight loss Providers:             Midge Minium MD, MD Referring MD:          Dana Allan (Referring MD) Medicines:             Propofol per Anesthesia Complications:         No immediate complications. Procedure:             Pre-Anesthesia Assessment:                        - Prior to the procedure, a History and Physical was                         performed, and patient medications and allergies were                         reviewed. The patient's tolerance of previous                         anesthesia was also reviewed. The risks and benefits                         of the procedure and the sedation options and risks                         were discussed with the patient. All questions were                         answered, and informed consent was obtained. Prior                         Anticoagulants: The patient has taken no anticoagulant                         or antiplatelet agents. ASA Grade Assessment: II - A                         patient with mild systemic disease. After reviewing                         the risks and benefits, the patient was deemed in                         satisfactory condition to undergo the procedure.                        After obtaining informed consent, the endoscope was                         passed under direct vision. Throughout the procedure,  the patient's blood pressure, pulse, and oxygen                         saturations were monitored continuously. The Endoscope                         was introduced through the mouth, and advanced to the                          second part of duodenum. The upper GI endoscopy was                         accomplished without difficulty. The patient tolerated                         the procedure well. Findings:      A small hiatal hernia was present.      Localized moderate inflammation characterized by erythema was found in       the gastric antrum. Biopsies were taken with a cold forceps for       histology.      The examined duodenum was normal. Impression:            - Small hiatal hernia.                        - Gastritis. Biopsied.                        - Normal examined duodenum. Recommendation:        - Discharge patient to home.                        - Resume previous diet.                        - Continue present medications.                        - Await pathology results.                        - Perform a colonoscopy today. Procedure Code(s):     --- Professional ---                        [phone], Esophagogastroduodenoscopy, flexible,                         transoral; with biopsy, single or multiple Diagnosis Code(s):     --- Professional ---                        R63.4, Abnormal weight loss                        K29.70, Gastritis, unspecified, without bleeding CPT copyright 2022 American Medical Association. All rights reserved. The codes documented in this report are preliminary and upon coder review may  be revised to meet current compliance requirements. Midge Minium MD, MD 01/15/2023 9:09:41 AM This report has been signed electronically. Number of Addenda: 0 Note Initiated On: 01/15/2023 8:45 AM Estimated Blood Loss:  Estimated blood  loss: none.      [facility]

## 2023-01-15 NOTE — Transfer of Care (Signed)
Immediate Anesthesia Transfer of Care Note  Patient: Amy Grant  Procedure(s) Performed: COLONOSCOPY WITH PROPOFOL ESOPHAGOGASTRODUODENOSCOPY (EGD) WITH PROPOFOL BIOPSY POLYPECTOMY  Patient Location: PACU  Anesthesia Type:General  Level of Consciousness: sedated  Airway & Oxygen Therapy: Patient Spontanous Breathing  Post-op Assessment: Report given to RN and Post -op Vital signs reviewed and stable  Post vital signs: Reviewed and stable  Last Vitals:  Vitals Value Taken Time  BP    Temp    Pulse    Resp    SpO2      Last Pain:  Vitals:   01/15/23 0837  TempSrc: Temporal  PainSc: 0-No pain         Complications: No notable events documented.

## 2023-01-15 NOTE — Op Note (Signed)
[facility] Gastroenterology Patient Name: Amy Grant Procedure Date: 01/15/2023 8:45 AM MRN: [id number] Account #: [id number] Date of Birth: Oct 29, 1950 Admit Type: Outpatient Age: 72 Room: [facility] ENDO ROOM 4 Gender: Female Note Status: Finalized Instrument Name: Prentice Docker [id number] Procedure:             Colonoscopy Indications:           Change in bowel habits Providers:             Midge Minium MD, MD Referring MD:          Dana Allan (Referring MD) Medicines:             Propofol per Anesthesia Complications:         No immediate complications. Procedure:             Pre-Anesthesia Assessment:                        - Prior to the procedure, a History and Physical was                         performed, and patient medications and allergies were                         reviewed. The patient's tolerance of previous                         anesthesia was also reviewed. The risks and benefits                         of the procedure and the sedation options and risks                         were discussed with the patient. All questions were                         answered, and informed consent was obtained. Prior                         Anticoagulants: The patient has taken no anticoagulant                         or antiplatelet agents. ASA Grade Assessment: II - A                         patient with mild systemic disease. After reviewing                         the risks and benefits, the patient was deemed in                         satisfactory condition to undergo the procedure.                        After obtaining informed consent, the colonoscope was                         passed under direct vision. Throughout the procedure,  the patient's blood pressure, pulse, and oxygen                         saturations were monitored continuously. The                         Colonoscope was introduced through the anus and                          advanced to the the cecum, identified by appendiceal                         orifice and ileocecal valve. The colonoscopy was                         performed without difficulty. The patient tolerated                         the procedure well. The quality of the bowel                         preparation was excellent. Findings:      The perianal and digital rectal examinations were normal.      A 5 mm polyp was found in the sigmoid colon. The polyp was sessile. The       polyp was removed with a cold snare. Resection and retrieval were       complete.      A 7 mm polyp was found in the cecum. The polyp was sessile. The polyp       was removed with a cold snare. Resection and retrieval were complete.      Non-bleeding hemorrhoids were found during retroflexion. The hemorrhoids       were Grade I (internal hemorrhoids that do not prolapse). Impression:            - One 5 mm polyp in the sigmoid colon, removed with a                         cold snare. Resected and retrieved.                        - One 7 mm polyp in the cecum, removed with a cold                         snare. Resected and retrieved.                        - Non-bleeding hemorrhoids. Recommendation:        - Discharge patient to home.                        - Resume previous diet.                        - Continue present medications.                        - Await pathology results. Procedure Code(s):     --- Professional ---  [address], Colonoscopy, flexible; with removal of                         tumor(s), polyp(s), or other lesion(s) by snare                         technique Diagnosis Code(s):     --- Professional ---                        R19.4, Change in bowel habit                        D12.5, Benign neoplasm of sigmoid colon CPT copyright 2022 American Medical Association. All rights reserved. The codes documented in this report are preliminary and upon coder review may  be  revised to meet current compliance requirements. Midge Minium MD, MD 01/15/2023 9:26:06 AM This report has been signed electronically. Number of Addenda: 0 Note Initiated On: 01/15/2023 8:45 AM Scope Withdrawal Time: 0 hours 8 minutes 0 seconds  Total Procedure Duration: 0 hours 13 minutes 12 seconds  Estimated Blood Loss:  Estimated blood loss: none.      [facility]

## 2023-01-15 NOTE — Anesthesia Postprocedure Evaluation (Signed)
Anesthesia Post Note  Patient: Amy Grant  Procedure(s) Performed: COLONOSCOPY WITH PROPOFOL ESOPHAGOGASTRODUODENOSCOPY (EGD) WITH PROPOFOL BIOPSY POLYPECTOMY  Patient location during evaluation: PACU Anesthesia Type: General Level of consciousness: awake Pain management: pain level controlled Vital Signs Assessment: post-procedure vital signs reviewed and stable Respiratory status: spontaneous breathing Cardiovascular status: stable Anesthetic complications: no   No notable events documented.   Last Vitals:  Vitals:   01/15/23 0942 01/15/23 1001  BP: 102/61 111/88  Pulse: 79   Resp: 18   Temp: (!) 35.9 C   SpO2: 100% 100%    Last Pain:  Vitals:   01/15/23 0942  TempSrc: Temporal  PainSc: 0-No pain                 VAN STAVEREN,Eain Mullendore

## 2023-01-15 NOTE — Anesthesia Preprocedure Evaluation (Signed)
Anesthesia Evaluation  Patient identified by MRN, date of birth, ID band Patient awake    Reviewed: Allergy & Precautions, NPO status , Patient's Chart, lab work & pertinent test results  Airway Mallampati: II  TM Distance: >3 FB Neck ROM: Full    Dental  (+) Upper Dentures, Partial Lower   Pulmonary neg pulmonary ROS, former smoker   Pulmonary exam normal breath sounds clear to auscultation       Cardiovascular Exercise Tolerance: Good hypertension, Pt. on medications negative cardio ROS Normal cardiovascular exam Rhythm:Regular Rate:Normal     Neuro/Psych Seizures -,   Anxiety     negative neurological ROS  negative psych ROS   GI/Hepatic negative GI ROS, Neg liver ROS,GERD  Medicated,,(+) Hepatitis -, C  Endo/Other  negative endocrine ROS    Renal/GU negative Renal ROS  negative genitourinary   Musculoskeletal   Abdominal Normal abdominal exam  (+)   Peds negative pediatric ROS (+)  Hematology negative hematology ROS (+)   Anesthesia Other Findings Past Medical History: No date: Anxiety No date: Arthritis     Comment:  right hand, DDD L 4 , L 5 No date: Asthma No date: Blood in stool 11/16/2019: Cervical radicular pain No date: Chronic intermittent post-traumatic headache 03/28/2017: Chronic neck pain with history of cervical spinal surgery     Comment:  S/p surgery Dr. Yevette Edwards 08/07/2016: Chronic radicular lumbar pain     Comment:  Reviewed MRI lumbar 04/2016 degenerative changes worse               L5-S1 disc desiccation with ht loss, b/l facet effusion,               synovial cysts, mild facet arthropathy, disc bulging and               arthropathy at some areas marked also marked right neural              foraminal narrowing with compression of exiting nerve               rool L4/5, L5/S1 disc bulge and b/l foraminal extension               of marked facet arthropathy>>>severe b/l neural foraminal               stenos No date: Colon polyps 2018: Complication of anesthesia     Comment:  difficulty waking up  No date: Depression 12/17/2021: Failed back surgical syndrome No date: Family history of adverse reaction to anesthesia     Comment:  Sister woke up during surgery No date: GERD (gastroesophageal reflux disease) No date: Head injury No date: Headache     Comment:  h/o migraines  No date: Hepatitis No date: Hepatitis C     Comment:  treated with mavyret per GI No date: History of blood transfusion 03/27/2020: History of hepatitis C 11/21/2019: History of lumbar surgery No date: Hypertension No date: Laryngopharyngeal reflux (LPR) No date: Multiple falls No date: Oral lichen planus 03/28/2017: Osteoarthritis No date: Pancreatitis     Comment:  remote hx w/o h/o drinking at that time No date: Seizures (HCC)     Comment:   after head injury No date: Vitamin D deficiency  Past Surgical History: 1979: ABDOMINAL HYSTERECTOMY     Comment:  has an ovary left s/p hysterectomy 1979; h/o abnormal               pap  No date: acdf  Comment:  08/07/16 Dr. Yevette Edwards  08/07/2016: ANTERIOR CERVICAL DECOMP/DISCECTOMY FUSION; N/A     Comment:  Procedure: ANTERIOR CERVICAL DECOMPRESSION FUSION,               CERVICAL FOUR-FIVE  WITH INSTRUMENTATION AND ALLOGRAFT;                Surgeon: Estill Bamberg, MD;  Location: [facility] OR;  Service:               Orthopedics;  Laterality: N/A; No date: APPENDECTOMY No date: BREAST BIOPSY; Left     Comment:  over 20 years ago negative left breast as of 03/2020  No date: CESAREAN SECTION No date: COLON RESECTION     Comment:  paritial ? reason done in [place]  No date: COLONOSCOPY No date: EXPLORATORY LAPAROTOMY     Comment:  after C Section  x 2 - 1 for bleeding and 1 for               infection No date: HERNIA REPAIR     Comment:  Inguinal - as a child No date: low back surgery     Comment:  Dr. Yevette Edwards  No date: TONSILLECTOMY 03/24/2018:  TRANSFORAMINAL LUMBAR INTERBODY FUSION (TLIF) WITH PEDICLE  SCREW FIXATION 1 LEVEL; Right     Comment:  Procedure: RIGHT LUMBAR 4 - LUMBAR 5 TRANSFORAMINAL               LUMBAR INTERBODY FUSION WITH INSTRUMENTATION AND               ALLOGRAFT;  Surgeon: Estill Bamberg, MD;  Location: [facility]               OR;  Service: Orthopedics;  Laterality: Right;  BMI    Body Mass Index: 18.97 kg/m      Reproductive/Obstetrics negative OB ROS                              Anesthesia Physical Anesthesia Plan  ASA: 2  Anesthesia Plan: General   Post-op Pain Management:    Induction: Intravenous  PONV Risk Score and Plan: Propofol infusion and TIVA  Airway Management Planned: Natural Airway and Nasal Cannula  Additional Equipment:   Intra-op Plan:   Post-operative Plan:   Informed Consent: I have reviewed the patients History and Physical, chart, labs and discussed the procedure including the risks, benefits and alternatives for the proposed anesthesia with the patient or authorized representative who has indicated his/her understanding and acceptance.     Dental Advisory Given  Plan Discussed with: CRNA and Surgeon  Anesthesia Plan Comments:          Anesthesia Quick Evaluation

## 2023-01-16 ENCOUNTER — Encounter: Payer: Self-pay | Admitting: Gastroenterology

## 2023-01-16 LAB — SURGICAL PATHOLOGY

## 2023-01-17 ENCOUNTER — Encounter: Payer: Self-pay | Admitting: Gastroenterology

## 2023-01-19 ENCOUNTER — Telehealth: Payer: Self-pay

## 2023-01-19 DIAGNOSIS — R634 Abnormal weight loss: Secondary | ICD-10-CM

## 2023-01-19 NOTE — Telephone Encounter (Signed)
Pt was able to see the results of pathology report... Pt would like to know what is next regarding weight loss... I viewed your note and you stated that CT scan may be considered in procedures are unremarkable... Please advise if okay to schedule CT scan

## 2023-01-20 NOTE — Addendum Note (Signed)
Addended by: Roena Malady on: 01/20/2023 10:24 AM   Modules accepted: Orders

## 2023-01-20 NOTE — Telephone Encounter (Signed)
Left message on voicemail CT has been scheduled for 11/27 arrive at 8:45

## 2023-01-21 NOTE — Telephone Encounter (Signed)
Pt is aware of scheduled CT scan and arrival time

## 2023-01-28 ENCOUNTER — Ambulatory Visit
Admission: RE | Admit: 2023-01-28 | Discharge: 2023-01-28 | Disposition: A | Discharge status: Home / Self Care | Payer: Medicare Other | Source: Ambulatory Visit | Attending: Gastroenterology | Admitting: Gastroenterology

## 2023-01-28 DIAGNOSIS — K769 Liver disease, unspecified: Secondary | ICD-10-CM | POA: Diagnosis not present

## 2023-01-28 DIAGNOSIS — R634 Abnormal weight loss: Secondary | ICD-10-CM | POA: Diagnosis not present

## 2023-01-28 MED ORDER — IOHEXOL 300 MG/ML  SOLN
100.0000 mL | Freq: Once | INTRAMUSCULAR | Status: AC | PRN
  Administered 2023-01-28: 100 mL via INTRAVENOUS

## 2023-02-10 ENCOUNTER — Other Ambulatory Visit: Payer: Self-pay | Admitting: Family Medicine

## 2023-02-10 DIAGNOSIS — I10 Essential (primary) hypertension: Secondary | ICD-10-CM

## 2023-02-10 NOTE — Telephone Encounter (Signed)
Last seen by Dr. Clent Ridges in June 2024.   Last ordered: 1 year ago (10/22/2021) by Bevelyn Buckles, MD

## 2023-03-02 ENCOUNTER — Telehealth: Payer: Self-pay | Admitting: Gastroenterology

## 2023-03-02 NOTE — Telephone Encounter (Signed)
The patient called in and left a voicemail stating she has been having nausea since she had her procedure. She wants to know if she needs an earlier appointment. I called the patient back to let her know we got her message, and I schedule the patient with the PA.

## 2023-03-03 ENCOUNTER — Encounter: Payer: Self-pay | Admitting: Student in an Organized Health Care Education/Training Program

## 2023-03-03 ENCOUNTER — Ambulatory Visit
Payer: Medicare Other | Attending: Student in an Organized Health Care Education/Training Program | Admitting: Student in an Organized Health Care Education/Training Program

## 2023-03-03 VITALS — BP 88/63 | HR 74 | Temp 97.4°F | Resp 16 | Ht 65.0 in | Wt 115.0 lb

## 2023-03-03 DIAGNOSIS — M961 Postlaminectomy syndrome, not elsewhere classified: Secondary | ICD-10-CM | POA: Diagnosis not present

## 2023-03-03 DIAGNOSIS — M5416 Radiculopathy, lumbar region: Secondary | ICD-10-CM | POA: Insufficient documentation

## 2023-03-03 DIAGNOSIS — G8929 Other chronic pain: Secondary | ICD-10-CM | POA: Insufficient documentation

## 2023-03-03 DIAGNOSIS — M48062 Spinal stenosis, lumbar region with neurogenic claudication: Secondary | ICD-10-CM | POA: Insufficient documentation

## 2023-03-03 DIAGNOSIS — G894 Chronic pain syndrome: Secondary | ICD-10-CM | POA: Insufficient documentation

## 2023-03-03 MED ORDER — HYDROCODONE-ACETAMINOPHEN 7.5-325 MG PO TABS: 1.0000 | ORAL_TABLET | Freq: Four times a day (QID) | ORAL | 0 refills | Status: DC | PRN

## 2023-03-03 NOTE — Progress Notes (Signed)
 Nursing Pain Medication Assessment:  Safety precautions to be maintained throughout the outpatient stay will include: orient to surroundings, keep bed in low position, maintain call bell within reach at all times, provide assistance with transfer out of bed and ambulation.  Medication Inspection Compliance: Pill count conducted under aseptic conditions, in front of the patient. Neither the pills nor the bottle was removed from the patient's sight at any time. Once count was completed pills were immediately returned to the patient in their original bottle.  Medication: Hydrocodone /APAP Pill/Patch Count:  37 of 120 pills remain Pill/Patch Appearance: Markings consistent with prescribed medication Bottle Appearance: Standard pharmacy container. Clearly labeled. Filled Date: 10 / 03 / 2024 Last Medication intake:  Yesterday

## 2023-03-03 NOTE — Progress Notes (Signed)
 PROVIDER NOTE: Information contained herein reflects review and annotations entered in association with encounter. Interpretation of such information and data should be left to medically-trained personnel. Information provided to patient can be located elsewhere in the medical record under Patient Instructions. Document created using STT-dictation technology, any transcriptional errors that may result from process are unintentional.    Patient: Amy Grant  Service Category: E/M  Provider: Wallie Sherry, MD  DOB: June 17, 1950  DOS: 03/03/2023  Referring Provider: Hope Merle, MD  MRN: [id number]  Specialty: Interventional Pain Management  PCP: Hope Merle, MD  Type: Established Patient  Setting: Ambulatory outpatient    Location: Office  Delivery: Face-to-face     HPI  [name], a 72 y.o. year old female, is here today because of her Chronic radicular lumbar pain [M54.16, G89.29]. [name] primary complain today is Back Pain (Lumbar midline as well as in thoracic area bilaterally ) Last encounter: My last encounter with her was on 12/04/22 Pertinent problems: [name] has Radiculopathy; Spinal stenosis, lumbar region, with neurogenic claudication; Lumbar degenerative disc disease; Chronic pain syndrome; and Pain management contract signed on their pertinent problem list. Pain Assessment: Severity of Chronic pain is reported as a 3 /10. Location: Back Right, Left, Mid, Lower/into both legs. Onset: More than a month ago. Quality: Discomfort, Other (Comment), Spasm (jittery pain in the knee). Timing: Constant. Modifying factor(s): medications, heat. Vitals:  height is 5' 5 (1.651 m) and weight is 115 lb (52.2 kg). Her temporal temperature is 97.4 F (36.3 C) (abnormal). Her blood pressure is 88/63 (abnormal) and her pulse is 74. Her respiration is 16 and oxygen saturation is 99%.  BMI: Estimated body mass index is 19.14 kg/m as calculated from the following:   Height as of this  encounter: 5' 5 (1.651 m).   Weight as of this encounter: 115 lb (52.2 kg).  Reason for encounter: medication management.   Discussed the use of AI scribe software for clinical note transcription with the patient, who gave verbal consent to proceed.  History of Present Illness   The patient, with a history of chronic pain, reports a decrease in the use of hydrocodone  from daily to approximately four to five times per week. They attribute this reduction to the use of a vape, which they report does not alleviate pain but provides relaxation. They express uncertainty about the appropriateness of this approach and its potential impact on their health.  The patient describes their pain as a raw and burning sensation in their back, which is particularly exacerbated by household chores such as vacuuming. They report needing to take additional pain medication on some days due to this discomfort.  In addition to hydrocodone , the patient is also on gabapentin , taken twice a day at a dose of 300mg . They express a desire to reduce this dosage due to concerns about long-term dependence. They also take tizanidine , but not regularly, and reserve it for instances of muscle spasms or tightness.  The patient has been gradually increasing their hydrocodone  dosage over the past year and a half, from an initial 20mg  prescribed by their primary care provider to the current 120mg . They acknowledge the need to limit their intake of this medication.  The patient's last urine screen was positive for THC. They express a preference for their pain medication over vaping, despite the latter's relaxing effect. They agree to discontinue vaping and ensure future screens are negative for THC.       Pharmacotherapy Assessment  Analgesic:  Norco 7.5 mg twice daily as needed, usually a quantity of 120 last her 3 months however she states that she has been taking it more frequently, up to twice a day on days that she has pain  flares  Monitoring: [place] PMP: PDMP reviewed during this encounter.       Pharmacotherapy: No side-effects or adverse reactions reported. Compliance: No problems identified. Effectiveness: Clinically acceptable.  UDS:  Summary  Date Value Ref Range Status  09/09/2022 Note  Final    Comment:    ==================================================================== [facility] 13 (MW) ==================================================================== Test                             Result       Flag       Units  Drug Present and Declared for Prescription Verification   Hydrocodone                     997          EXPECTED   ng/mg creat   Hydromorphone                   324          EXPECTED   ng/mg creat   Norhydrocodone                 934          EXPECTED   ng/mg creat    Sources of hydrocodone  include scheduled prescription medications.    Hydromorphone  and norhydrocodone are expected metabolites of    hydrocodone . Hydromorphone  is also available as a scheduled    prescription medication.  Drug Present not Declared for Prescription Verification   Carboxy-THC                    34           UNEXPECTED ng/mg creat    Carboxy-THC is a metabolite of tetrahydrocannabinol (THC). Source of    THC is most commonly herbal marijuana or marijuana-based products,    but THC is also present in a scheduled prescription medication.    Trace amounts of THC can be present in hemp and cannabidiol (CBD)    products. This test is not intended to distinguish between delta-9-    tetrahydrocannabinol, the predominant form of THC in most herbal or    marijuana-based products, and delta-8-tetrahydrocannabinol.  ==================================================================== Test                      Result    Flag   Units      Ref Range   Creatinine              62               mg/dL      >=79 ==================================================================== Declared Medications:  The  flagging and interpretation on this report are based on the  following declared medications.  Unexpected results may arise from  inaccuracies in the declared medications.   **Note: The testing scope of this panel includes these medications:   Hydrocodone  (Norco)   **Note: The testing scope of this panel does not include the  following reported medications:   Acetaminophen  (Norco)  Albuterol  (Ventolin  HFA)  Calcium   Fluoride  (Prevident )  Gabapentin  (Neurontin )  Magnesium  Nystatin  (Mycostatin )  Tizanidine  (Zanaflex )  Verapamil  (Calan )  Vitamin D  ==================================================================== For clinical consultation, please call [phone]. ====================================================================  ROS  Constitutional: Denies any fever or chills Gastrointestinal: No reported hemesis, hematochezia, vomiting, or acute GI distress Musculoskeletal: low back pain Neurological: No reported episodes of acute onset apraxia, aphasia, dysarthria, agnosia, amnesia, paralysis, loss of coordination, or loss of consciousness  Medication Review  Calcium -Magnesium-Vitamin D , HYDROcodone -acetaminophen , Sod Fluoride -Potassium Nitrate , albuterol , gabapentin , tiZANidine , and verapamil   History Review  Allergy: Ms. Profitt is allergic to latex, sulfa antibiotics, and linaclotide. Drug: Ms. Loera  reports no history of drug use. Alcohol:  reports current alcohol use of about 7.0 standard drinks of alcohol per week. Tobacco:  reports that she quit smoking about 12 years ago. Her smoking use included cigarettes. She started smoking about 42 years ago. She has never used smokeless tobacco. Social: Ms. Postma  reports that she quit smoking about 12 years ago. Her smoking use included cigarettes. She started smoking about 42 years ago. She has never used smokeless tobacco. She reports current alcohol use of about 7.0 standard drinks of alcohol per week. She  reports that she does not use drugs. Medical:  has a past medical history of Anxiety, Arthritis, Asthma, Blood in stool, Cervical radicular pain (11/16/2019), Chronic intermittent post-traumatic headache, Chronic neck pain with history of cervical spinal surgery (03/28/2017), Chronic radicular lumbar pain (08/07/2016), Colon polyps, Complication of anesthesia (2018), Depression, Failed back surgical syndrome (12/17/2021), Family history of adverse reaction to anesthesia, GERD (gastroesophageal reflux disease), Head injury, Headache, Hepatitis, Hepatitis C, History of blood transfusion, History of hepatitis C (03/27/2020), History of lumbar surgery (11/21/2019), Hypertension, Laryngopharyngeal reflux (LPR), Multiple falls, Oral lichen planus, Osteoarthritis (03/28/2017), Pancreatitis, Seizures (HCC), and Vitamin D  deficiency. Surgical: Ms. Majid  has a past surgical history that includes Hernia repair; Cesarean section; Exploratory laparotomy; Tonsillectomy; Appendectomy; Colon resection; Colonoscopy; Anterior cervical decomp/discectomy fusion (N/A, 08/07/2016); Breast biopsy (Left); Abdominal hysterectomy (1979); acdf; Transforaminal lumbar interbody fusion (tlif) with pedicle screw fixation 1 level (Right, 03/24/2018); low back surgery; Colonoscopy with propofol  (N/A, 01/15/2023); Esophagogastroduodenoscopy (egd) with propofol  (N/A, 01/15/2023); biopsy (01/15/2023); and polypectomy (01/15/2023). Family: family history includes Arthritis in her maternal grandmother and sister; Breast cancer (age of onset: 35) in her sister; Cancer in her maternal grandfather, mother, and sister; Diabetes in her paternal grandmother and sister; Heart disease in her mother; Heart failure in her mother; Hyperlipidemia in her maternal grandmother; Hypertension in her mother; Learning disabilities in her maternal grandmother; Miscarriages / Stillbirths in her daughter, mother, sister, and sister; Multiple sclerosis in her father; Other  in her sister.  Laboratory Chemistry Profile   Renal Lab Results  Component Value Date   BUN 10 07/14/2022   CREATININE 1.04 07/14/2022   BCR 12 08/02/2021   GFR 54.67 (L) 07/14/2022   GFRAA >60 03/18/2018   GFRNONAA >60 03/18/2018    Hepatic Lab Results  Component Value Date   AST 22 07/14/2022   ALT 16 07/14/2022   ALBUMIN 4.4 07/14/2022   ALKPHOS 65 07/14/2022    Electrolytes Lab Results  Component Value Date   NA 142 07/14/2022   K 3.9 07/14/2022   CL 103 07/14/2022   CALCIUM  9.5 07/14/2022    Bone Lab Results  Component Value Date   VD25OH 119.53 (HH) 07/14/2022    Inflammation (CRP: Acute Phase) (ESR: Chronic Phase) No results found for: CRP, ESRSEDRATE, LATICACIDVEN       Note: Above Lab results reviewed.  Recent Imaging Review  CT ABDOMEN PELVIS W CONTRAST CLINICAL DATA:  Unintentional weight loss.  EXAM: CT ABDOMEN AND PELVIS WITH CONTRAST  TECHNIQUE: Multidetector  CT imaging of the abdomen and pelvis was performed using the standard protocol following bolus administration of intravenous contrast.  RADIATION DOSE REDUCTION: This exam was performed according to the departmental dose-optimization program which includes automated exposure control, adjustment of the mA and/or kV according to patient size and/or use of iterative reconstruction technique.  CONTRAST:  100mL OMNIPAQUE  IOHEXOL  300 MG/ML  SOLN  COMPARISON:  None Available.  FINDINGS: Lower Chest: No acute findings.  Hepatobiliary: A 6 mm homogeneous hypervascular lesion is seen in segment 8 of the right hepatic lobe on image 17/3. No other hepatic masses are identified. Gallbladder is unremarkable. No evidence of biliary ductal dilatation.  Pancreas:  No mass or inflammatory changes.  Spleen: Within normal limits in size and appearance.  Adrenals/Urinary Tract: No suspicious masses identified. No evidence of ureteral calculi or hydronephrosis.  Stomach/Bowel: No evidence  of obstruction, inflammatory process or abnormal fluid collections. Large stool burden seen throughout the colon.  Vascular/Lymphatic: No pathologically enlarged lymph nodes. No acute vascular findings.  Reproductive: Prior hysterectomy noted. Adnexal regions are unremarkable in appearance.  Other:  None.  Musculoskeletal: No suspicious bone lesions identified. Lumbar spine fusion hardware noted at L4-5.  IMPRESSION: 6 mm hypervascular lesion in right hepatic lobe, with differential diagnosis including flash-filling hemangioma, focal nodular hyperplasia, or hepatic adenoma. Recommend follow-up by imaging in 6 months, preferably with abdomen MRI without and with Eovist  contrast.  Large colonic stool burden noted; recommend clinical correlation for possible constipation.  Electronically Signed   By: Norleen DELENA Kil M.D.   On: 01/31/2023 17:00 Note: Reviewed        Physical Exam  General appearance: Well nourished, well developed, and well hydrated. In no apparent acute distress Mental status: Alert, oriented x 3 (person, place, & time)       Respiratory: No evidence of acute respiratory distress Eyes: PERLA Vitals: BP (!) 88/63 (BP Location: Left Arm, Patient Position: Sitting, Cuff Size: Normal)   Pulse 74   Temp (!) 97.4 F (36.3 C) (Temporal)   Resp 16   Ht 5' 5 (1.651 m)   Wt 115 lb (52.2 kg)   SpO2 99%   BMI 19.14 kg/m  BMI: Estimated body mass index is 19.14 kg/m as calculated from the following:   Height as of this encounter: 5' 5 (1.651 m).   Weight as of this encounter: 115 lb (52.2 kg). Ideal: Ideal body weight: 57 kg (125 lb 10.6 oz)  Pain overlying left sternocleidomastoid  Lumbar Spine Area Exam  Skin & Axial Inspection: Well healed scar from previous spine surgery detected Alignment: Symmetrical Functional ROM: Pain restricted ROM affecting both sides Stability: No instability detected Muscle Tone/Strength: Functionally intact. No obvious  neuro-muscular anomalies detected. Sensory (Neurological): Dermatomal pain pattern L4/5 (left) Palpation: No palpable anomalies         Lower Extremity Exam      Side: Right lower extremity   Side: Left lower extremity  Stability: No instability observed           Stability: No instability observed          Skin & Extremity Inspection: Skin color, temperature, and hair growth are WNL. No peripheral edema or cyanosis. No masses, redness, swelling, asymmetry, or associated skin lesions. No contractures.   Skin & Extremity Inspection: Skin color, temperature, and hair growth are WNL. No peripheral edema or cyanosis. No masses, redness, swelling, asymmetry, or associated skin lesions. No contractures.  Functional ROM: Unrestricted ROM  Functional ROM: Unrestricted ROM                  Muscle Tone/Strength: Functionally intact. No obvious neuro-muscular anomalies detected.   Muscle Tone/Strength: Functionally intact. No obvious neuro-muscular anomalies detected.  Sensory (Neurological): Dermatomal pain pattern         Sensory (Neurological): Dermatomal pain pattern        DTR: Patellar: deferred today Achilles: deferred today Plantar: deferred today   DTR: Patellar: deferred today Achilles: deferred today Plantar: deferred today  Palpation: No palpable anomalies   Palpation: No palpable anomalies     Assessment   Diagnosis Status  1. Chronic radicular lumbar pain   2. Spinal stenosis, lumbar region, with neurogenic claudication   3. Post laminectomy syndrome   4. Failed back surgical syndrome   5. Chronic pain syndrome    Controlled Controlled Controlled     Plan of Care   Assessment and Plan    Medication Management They express concerns about long-term dependence on gabapentin , currently prescribed at 300 mg twice daily. Gabapentin , while not an opioid, can still lead to dependence. We discussed the potential for dose reduction if clinically appropriate. The  plan is to continue gabapentin  at the current dose and consider dose reduction if it becomes clinically appropriate.  Follow-up We will schedule their next refill for March 05, 2023, ensuring the prescription covers three months.        1. Chronic radicular lumbar pain (Primary) - HYDROcodone -acetaminophen  (NORCO) 7.5-325 MG tablet; Take 1 tablet by mouth every 6 (six) hours as needed for severe pain (pain score 7-10). Must last 30 days.  Dispense: 120 tablet; Refill: 0  2. Spinal stenosis, lumbar region, with neurogenic claudication  3. Post laminectomy syndrome  4. Failed back surgical syndrome  5. Chronic pain syndrome - HYDROcodone -acetaminophen  (NORCO) 7.5-325 MG tablet; Take 1 tablet by mouth every 6 (six) hours as needed for severe pain (pain score 7-10). Must last 30 days.  Dispense: 120 tablet; Refill: 0    Requested Prescriptions   Signed Prescriptions Disp Refills   HYDROcodone -acetaminophen  (NORCO) 7.5-325 MG tablet 120 tablet 0    Sig: Take 1 tablet by mouth every 6 (six) hours as needed for severe pain (pain score 7-10). Must last 30 days.   I have discussed spinal injections (L-TF ESI) as well as a SCS trial.  Continue Gabapentin  as prescribed Continue Baclofen  prn   Follow-up plan:   Return in about 3 months (around 06/01/2023) for MM, F2F.     Recent Visits Date Type Provider Dept  12/04/22 Office Visit Marcelino Nurse, MD [facility]  Showing recent visits within past 90 days and meeting all other requirements Today's Visits Date Type Provider Dept  03/03/23 Office Visit Marcelino Nurse, MD [facility]  Showing today's visits and meeting all other requirements Future Appointments Date Type Provider Dept  05/28/23 Appointment Marcelino Nurse, MD [facility]  Showing future appointments within next 90 days and meeting all other requirements  I discussed the assessment and treatment plan with the patient. The patient was  provided an opportunity to ask questions and all were answered. The patient agreed with the plan and demonstrated an understanding of the instructions.  Patient advised to call back or seek an in-person evaluation if the symptoms or condition worsens.  Duration of encounter: .  Total time on encounter, as per AMA guidelines included both the face-to-face and non-face-to-face time personally spent by the physician and/or other qualified health  care professional(s) on the day of the encounter (includes time in activities that require the physician or other qualified health care professional and does not include time in activities normally performed by clinical staff). Physician's time may include the following activities when performed: Preparing to see the patient (e.g., pre-charting review of records, searching for previously ordered imaging, lab work, and nerve conduction tests) Review of prior analgesic pharmacotherapies. Reviewing PMP Interpreting ordered tests (e.g., lab work, imaging, nerve conduction tests) Performing post-procedure evaluations, including interpretation of diagnostic procedures Obtaining and/or reviewing separately obtained history Performing a medically appropriate examination and/or evaluation Counseling and educating the patient/family/caregiver Ordering medications, tests, or procedures Referring and communicating with other health care professionals (when not separately reported) Documenting clinical information in the electronic or other health record Independently interpreting results (not separately reported) and communicating results to the patient/ family/caregiver Care coordination (not separately reported)  Note by: Wallie Sherry, MD Date: 03/03/2023; Time: 9:21 AM

## 2023-03-16 ENCOUNTER — Telehealth: Payer: Self-pay | Admitting: Physician Assistant

## 2023-03-16 ENCOUNTER — Telehealth: Payer: Self-pay

## 2023-03-16 NOTE — Telephone Encounter (Signed)
 Pt requesting call back having severe nausea since procedure

## 2023-03-16 NOTE — Telephone Encounter (Signed)
 Please advise. Seen Dr.Wohl last month unexplained weight loss. Spoke with patient- having nausea since Christmas- its all the time- she is also vomiting and has diarrhea. Denies fever. She is having chills..  She is not keeping food down. She has soiled her pants several times.

## 2023-03-18 NOTE — Progress Notes (Signed)
Grant Amy, PA-C [address]  [address]  [place], [place] [address]  Main: [phone]  Fax: [phone]   Primary Care Physician: Dana Allan, MD  Primary Gastroenterologist:  Grant Amy, PA-C / Dr. Midge Minium    CC: Nausea, Vomiting, Abdominal Pain, Weight Loss  HPI: CHLOI BLEILE is a 73 y.o. female presents to evaluate nausea, vomiting, abdominal pain, and weight loss.  Typical weight is between 135 lb and 140 lb.  Her weight is down 20 pounds in the past year, unintentional.  She weighs 111 lb today.  Not trying to lose weight.  She has intermittent waves of nausea and vomiting.  Abdominal pain has been located all over her abdomen upper lower and bilateral.  She has remote history of pancreatitis many years ago.  Is also worried about her gallbladder.  Has constipation and acid reflux.  No current treatment.  She drinks 1 to 2 glasses of wine per night.  Current flareup of GI symptoms started at the end of December, for the past 2 weeks.  She is having daily nausea, vomiting, and generalized abdominal pain.  Denies melena or hematochezia.  Patient saw Dr. Servando Snare to evaluate GERD and weight loss 12/17/2022.  Intermittent episodes of diarrhea with occasional dark stool.  Has had multiple abdominal surgeries.  Scheduled for EGD and colonoscopy for further evaluation.  01/15/2023 EGD by Dr. Servando Snare: Small hiatal hernia, moderate gastritis, normal duodenum.  Biopsies negative for H. pylori.  Biopsy showed healing erosive gastritis.  01/15/2023 Colonoscopy: Excellent prep.  1 small sessile serrated and 1 small hyperplastic polyp removed.  Grade 1 nonbleeding internal hemorrhoids.  5-year repeat.  Previously treated for hepatitis C genotype Ia in 2019 with negative viral load in 2021.  01/28/2023 CT abdomen pelvis with contrast: 6 mm hypervascular lesion in right hepatic lobe, with differential diagnosis including flash-filling hemangioma, focal nodular hyperplasia, or hepatic  adenoma. Recommend follow-up by imaging in 6 months, preferably with abdomen MRI without and with Eovist contrast.  Large colonic stool burden noted; recommend clinical correlation for possible constipation.  Current Outpatient Medications  Medication Sig Dispense Refill   albuterol (VENTOLIN HFA) 108 (90 Base) MCG/ACT inhaler Inhale 1-2 puffs into the lungs every 6 (six) hours as needed for wheezing or shortness of breath. 18 g 11   Calcium-Magnesium-Vitamin D (CALCIUM 1200+D3 PO) Take 2 tablets by mouth daily.     gabapentin (NEURONTIN) 300 MG capsule TAKE 2 CAPSULES(600 MG) BY MOUTH TWICE DAILY 240 capsule 3   HYDROcodone-acetaminophen (NORCO) 7.5-325 MG tablet Take 1 tablet by mouth every 6 (six) hours as needed for severe pain (pain score 7-10). Must last 30 days. 120 tablet 0   Lactulose 20 GM/30ML SOLN Take 30 mLs (20 g total) by mouth daily. 900 mL 5   omeprazole (PRILOSEC) 40 MG capsule Take 1 capsule (40 mg total) by mouth daily. 30 capsule 5   PREVIDENT 5000 SENSITIVE 1.1-5 % GEL Take 1 Application by mouth See admin instructions.     tiZANidine (ZANAFLEX) 4 MG tablet TAKE 1 TABLET(4 MG) BY MOUTH AT BEDTIME 30 tablet 3   verapamil (CALAN-SR) 240 MG CR tablet TAKE 1 TABLET(240 MG) BY MOUTH AT BEDTIME 90 tablet 3   No current facility-administered medications for this visit.    Allergies as of 03/19/2023 - Review Complete 03/19/2023  Allergen Reaction Noted   Latex Hives 07/29/2016   Sulfa antibiotics Hives 08/23/2013   Linaclotide Itching 11/21/2013    Past Medical History:  Diagnosis Date   Anxiety    Arthritis    right hand, DDD L 4 , L 5   Asthma    Blood in stool    Cervical radicular pain 11/16/2019   Chronic intermittent post-traumatic headache    Chronic neck pain with history of cervical spinal surgery 03/28/2017   S/p surgery Dr. Yevette Edwards   Chronic radicular lumbar pain 08/07/2016   Reviewed MRI lumbar 04/2016 degenerative changes worse L5-S1 disc desiccation  with ht loss, b/l facet effusion, synovial cysts, mild facet arthropathy, disc bulging and arthropathy at some areas marked also marked right neural foraminal narrowing with compression of exiting nerve rool L4/5, L5/S1 disc bulge and b/l foraminal extension of marked facet arthropathy>>>severe b/l neural foraminal stenos   Colon polyps    Complication of anesthesia 2018   difficulty waking up    Depression    Failed back surgical syndrome 12/17/2021   Family history of adverse reaction to anesthesia    Sister woke up during surgery   GERD (gastroesophageal reflux disease)    Head injury    Headache    h/o migraines    Hepatitis    Hepatitis C    treated with mavyret per GI   History of blood transfusion    History of hepatitis C 03/27/2020   History of lumbar surgery 11/21/2019   Hypertension    Laryngopharyngeal reflux (LPR)    Multiple falls    Oral lichen planus    Osteoarthritis 03/28/2017   Pancreatitis    remote hx w/o h/o drinking at that time   Seizures (HCC)     after head injury   Vitamin D deficiency     Past Surgical History:  Procedure Laterality Date   ABDOMINAL HYSTERECTOMY  1979   has an ovary left s/p hysterectomy 1979; h/o abnormal pap    acdf     08/07/16 Dr. Yevette Edwards    ANTERIOR CERVICAL DECOMP/DISCECTOMY FUSION N/A 08/07/2016   Procedure: ANTERIOR CERVICAL DECOMPRESSION FUSION, CERVICAL FOUR-FIVE  WITH INSTRUMENTATION AND ALLOGRAFT;  Surgeon: Estill Bamberg, MD;  Location: [facility] OR;  Service: Orthopedics;  Laterality: N/A;   APPENDECTOMY     BIOPSY  01/15/2023   Procedure: BIOPSY;  Surgeon: Midge Minium, MD;  Location: [facility] ENDOSCOPY;  Service: Endoscopy;;   BREAST BIOPSY Left    over 20 years ago negative left breast as of 03/2020    CESAREAN SECTION     COLON RESECTION     paritial ? reason done in [place]    COLONOSCOPY     COLONOSCOPY WITH PROPOFOL N/A 01/15/2023   Procedure: COLONOSCOPY WITH PROPOFOL;  Surgeon: Midge Minium, MD;  Location: [facility]  ENDOSCOPY;  Service: Endoscopy;  Laterality: N/A;   ESOPHAGOGASTRODUODENOSCOPY (EGD) WITH PROPOFOL N/A 01/15/2023   Procedure: ESOPHAGOGASTRODUODENOSCOPY (EGD) WITH PROPOFOL;  Surgeon: Midge Minium, MD;  Location: [facility] ENDOSCOPY;  Service: Endoscopy;  Laterality: N/A;   EXPLORATORY LAPAROTOMY     after C Section  x 2 - 1 for bleeding and 1 for infection   HERNIA REPAIR     Inguinal - as a child   low back surgery     Dr. Yevette Edwards    POLYPECTOMY  01/15/2023   Procedure: POLYPECTOMY;  Surgeon: Midge Minium, MD;  Location: [facility] ENDOSCOPY;  Service: Endoscopy;;   TONSILLECTOMY     TRANSFORAMINAL LUMBAR INTERBODY FUSION (TLIF) WITH PEDICLE SCREW FIXATION 1 LEVEL Right 03/24/2018   Procedure: RIGHT LUMBAR 4 - LUMBAR 5 TRANSFORAMINAL LUMBAR INTERBODY FUSION WITH INSTRUMENTATION AND ALLOGRAFT;  Surgeon: Estill Bamberg, MD;  Location: [facility] OR;  Service: Orthopedics;  Laterality: Right;    Review of Systems:    All systems reviewed and negative except where noted in HPI.   Physical Examination:   BP (!) 146/85   Pulse 100   Temp 98.1 F (36.7 C)   Ht 5\' 5"  (1.651 m)   Wt 111 lb 9.6 oz (50.6 kg)   BMI 18.57 kg/m   General: Well-nourished, well-developed in no acute distress.  Lungs: Clear to auscultation bilaterally. Non-labored. Heart: Regular rate and rhythm, no murmurs rubs or gallops.  Abdomen: Bowel sounds are normal; Abdomen is Soft; No hepatosplenomegaly, masses or hernias;  No Abdominal Tenderness; No guarding or rebound tenderness. Neuro: Alert and oriented x 3.  Grossly intact.  Psych: Alert and cooperative, normal mood and affect.   Imaging Studies: No results found.  Assessment and Plan:   KEONDRIA LAUR is a 73 y.o. y/o female returns for follow-up of weight loss.  No explanation for weight loss was seen on EGD, colonoscopy, abdominal pelvic CT.  1.  Nausea and Vomiting: Suspect due to Gastritis, GERD, Constipation.  Start PPI for GERD  Treat constipation with Lactulose.    2.  Gastritis (H. pylori negative)  Start Rx Omeprazole 40mg  1 capsule once daily  3.  Generalized Abdominal Pain (Upper and Lower diffusely)  Lab: CBC, CMP, Lipase  Abd / Pelvic MRI  4.  Constipation  Start Lactulose 30ml daily  (Allergic to Linzess).  5.  Weight loss  Lab: TSH  6.  Liver Lesion  Abdominal / Pelvic MRI with and without Eovist contrast  Grant Amy, PA-C  Follow up 4 weeks with TG.

## 2023-03-19 ENCOUNTER — Ambulatory Visit (INDEPENDENT_AMBULATORY_CARE_PROVIDER_SITE_OTHER): Payer: Medicare Other | Admitting: Physician Assistant

## 2023-03-19 ENCOUNTER — Encounter: Payer: Self-pay | Admitting: Physician Assistant

## 2023-03-19 VITALS — BP 146/85 | HR 100 | Temp 98.1°F | Ht 65.0 in | Wt 111.6 lb

## 2023-03-19 DIAGNOSIS — K2930 Chronic superficial gastritis without bleeding: Secondary | ICD-10-CM

## 2023-03-19 DIAGNOSIS — R1084 Generalized abdominal pain: Secondary | ICD-10-CM

## 2023-03-19 DIAGNOSIS — K2970 Gastritis, unspecified, without bleeding: Secondary | ICD-10-CM | POA: Diagnosis not present

## 2023-03-19 DIAGNOSIS — K5900 Constipation, unspecified: Secondary | ICD-10-CM | POA: Diagnosis not present

## 2023-03-19 DIAGNOSIS — R112 Nausea with vomiting, unspecified: Secondary | ICD-10-CM

## 2023-03-19 DIAGNOSIS — K769 Liver disease, unspecified: Secondary | ICD-10-CM

## 2023-03-19 DIAGNOSIS — R634 Abnormal weight loss: Secondary | ICD-10-CM | POA: Diagnosis not present

## 2023-03-19 MED ORDER — LACTULOSE 20 GM/30ML PO SOLN: 30.0000 mL | Freq: Every day | ORAL | 5 refills | Status: DC

## 2023-03-19 MED ORDER — OMEPRAZOLE 40 MG PO CPDR: 40.0000 mg | DELAYED_RELEASE_CAPSULE | Freq: Every day | ORAL | 5 refills | Status: DC

## 2023-03-20 LAB — LIPASE: Lipase: 57 U/L (ref 14–72)

## 2023-03-20 LAB — CBC WITH DIFFERENTIAL/PLATELET
Basophils Absolute: 0.1 10*3/uL (ref 0.0–0.2)
Basos: 1 %
EOS (ABSOLUTE): 0.1 10*3/uL (ref 0.0–0.4)
Eos: 1 %
Hematocrit: 41.1 % (ref 34.0–46.6)
Hemoglobin: 13.6 g/dL (ref 11.1–15.9)
Immature Grans (Abs): 0 10*3/uL (ref 0.0–0.1)
Immature Granulocytes: 0 %
Lymphocytes Absolute: 1.5 10*3/uL (ref 0.7–3.1)
Lymphs: 20 %
MCH: 30.4 pg (ref 26.6–33.0)
MCHC: 33.1 g/dL (ref 31.5–35.7)
MCV: 92 fL (ref 79–97)
Monocytes Absolute: 0.5 10*3/uL (ref 0.1–0.9)
Monocytes: 7 %
Neutrophils Absolute: 5.4 10*3/uL (ref 1.4–7.0)
Neutrophils: 71 %
Platelets: 198 10*3/uL (ref 150–450)
RBC: 4.48 x10E6/uL (ref 3.77–5.28)
RDW: 11.8 % (ref 11.7–15.4)
WBC: 7.6 10*3/uL (ref 3.4–10.8)

## 2023-03-20 LAB — COMPREHENSIVE METABOLIC PANEL
ALT: 16 [IU]/L (ref 0–32)
AST: 27 [IU]/L (ref 0–40)
Albumin: 5 g/dL — ABNORMAL HIGH (ref 3.9–4.9)
Alkaline Phosphatase: 70 [IU]/L (ref 44–121)
BUN/Creatinine Ratio: 10 — ABNORMAL LOW (ref 12–28)
BUN: 10 mg/dL (ref 8–27)
Bilirubin Total: 1.1 mg/dL (ref 0.0–1.2)
CO2: 23 mmol/L (ref 20–29)
Calcium: 9.6 mg/dL (ref 8.7–10.3)
Chloride: 102 mmol/L (ref 96–106)
Creatinine, Ser: 1.02 mg/dL — ABNORMAL HIGH (ref 0.57–1.00)
Globulin, Total: 2.2 g/dL (ref 1.5–4.5)
Glucose: 90 mg/dL (ref 70–99)
Potassium: 4.2 mmol/L (ref 3.5–5.2)
Sodium: 142 mmol/L (ref 134–144)
Total Protein: 7.2 g/dL (ref 6.0–8.5)
eGFR: 59 mL/min/{1.73_m2} — ABNORMAL LOW (ref >59)

## 2023-03-20 LAB — TSH+FREE T4
Free T4: 1.45 ng/dL (ref 0.82–1.77)
TSH: 0.654 u[IU]/mL (ref 0.450–4.500)

## 2023-03-25 ENCOUNTER — Ambulatory Visit
Admission: RE | Admit: 2023-03-25 | Discharge: 2023-03-25 | Disposition: A | Discharge status: Home / Self Care | Payer: Medicare Other | Source: Ambulatory Visit | Attending: Physician Assistant | Admitting: Physician Assistant

## 2023-03-25 DIAGNOSIS — K769 Liver disease, unspecified: Secondary | ICD-10-CM | POA: Insufficient documentation

## 2023-03-25 DIAGNOSIS — R634 Abnormal weight loss: Secondary | ICD-10-CM | POA: Insufficient documentation

## 2023-03-25 DIAGNOSIS — R932 Abnormal findings on diagnostic imaging of liver and biliary tract: Secondary | ICD-10-CM | POA: Diagnosis not present

## 2023-03-25 DIAGNOSIS — I700 Atherosclerosis of aorta: Secondary | ICD-10-CM | POA: Diagnosis not present

## 2023-03-25 MED ORDER — GADOXETATE DISODIUM 0.25 MMOL/ML IV SOLN
5.0000 mL | Freq: Once | INTRAVENOUS | Status: AC | PRN
  Administered 2023-03-25: 5 mL via INTRAVENOUS

## 2023-03-31 ENCOUNTER — Telehealth: Payer: Self-pay | Admitting: Gastroenterology

## 2023-03-31 ENCOUNTER — Encounter: Payer: Self-pay | Admitting: Student in an Organized Health Care Education/Training Program

## 2023-03-31 ENCOUNTER — Ambulatory Visit
Payer: Medicare Other | Attending: Student in an Organized Health Care Education/Training Program | Admitting: Student in an Organized Health Care Education/Training Program

## 2023-03-31 ENCOUNTER — Encounter: Payer: Self-pay | Admitting: Physician Assistant

## 2023-03-31 VITALS — BP 82/64 | HR 86 | Temp 97.2°F | Resp 16 | Ht 65.0 in | Wt 107.0 lb

## 2023-03-31 DIAGNOSIS — G8929 Other chronic pain: Secondary | ICD-10-CM | POA: Insufficient documentation

## 2023-03-31 DIAGNOSIS — M48062 Spinal stenosis, lumbar region with neurogenic claudication: Secondary | ICD-10-CM | POA: Diagnosis not present

## 2023-03-31 DIAGNOSIS — M5416 Radiculopathy, lumbar region: Secondary | ICD-10-CM | POA: Diagnosis not present

## 2023-03-31 DIAGNOSIS — M961 Postlaminectomy syndrome, not elsewhere classified: Secondary | ICD-10-CM | POA: Insufficient documentation

## 2023-03-31 DIAGNOSIS — G894 Chronic pain syndrome: Secondary | ICD-10-CM | POA: Insufficient documentation

## 2023-03-31 MED ORDER — HYDROCODONE-ACETAMINOPHEN 7.5-325 MG PO TABS: 1.0000 | ORAL_TABLET | Freq: Three times a day (TID) | ORAL | 0 refills | Status: AC | PRN

## 2023-03-31 NOTE — Telephone Encounter (Signed)
I let pt know that the MRI has not been resulted yet.... I called [place] [facility] and asked to have the MRI read as soon as possible... The pt and Inetta Fermo are aware of the request

## 2023-03-31 NOTE — Telephone Encounter (Signed)
The patient called in to her results.

## 2023-03-31 NOTE — Patient Instructions (Signed)

## 2023-03-31 NOTE — Progress Notes (Signed)
PROVIDER NOTE: Information contained herein reflects review and annotations entered in association with encounter. Interpretation of such information and data should be left to medically-trained personnel. Information provided to patient can be located elsewhere in the medical record under "Patient Instructions". Document created using STT-dictation technology, any transcriptional errors that may result from process are unintentional.    Patient: Amy Grant  Service Category: E/M  Provider: Edward Jolly, MD  DOB: 1950/11/17  DOS: 03/31/2023  Referring Provider: Dana Allan, MD  MRN: [id number]  Specialty: Interventional Pain Management  PCP: Dana Allan, MD  Type: Established Patient  Setting: Ambulatory outpatient    Location: Office  Delivery: Face-to-face     HPI  [name], a 73 y.o. year old female, is here today because of her Chronic radicular lumbar pain [M54.16, G89.29]. Ms. Poynter primary complain today is Leg Pain (Left leg burning in calf and behind the knee ), Back Pain (Lumbar bilateral, left is worse ), and Abdominal Pain (Midline upper quadrant, this pain is being investigated. )  Pertinent problems: Ms. Haver has Radiculopathy; Spinal stenosis, lumbar region, with neurogenic claudication; Lumbar degenerative disc disease; Chronic pain syndrome; and Pain management contract signed on their pertinent problem list. Pain Assessment: Severity of Chronic pain is reported as a 5 /10. Location: Back Lower, Right, Left/down left leg to the calf. Onset: More than a month ago. Quality: Burning, Discomfort, Other (Comment) (feeling that something is going to break, or brittle). Timing: Constant. Modifying factor(s): heat, medications, patient is unsure of how she is taking them currently as she forgets when she took the last pill. Vitals:  height is 5\' 5"  (1.651 m) and weight is 107 lb (48.5 kg). Her temporal temperature is 97.2 F (36.2 C) (abnormal). Her blood pressure is 82/64  (abnormal) and her pulse is 86. Her respiration is 16 and oxygen saturation is 100%.  BMI: Estimated body mass index is 17.81 kg/m as calculated from the following:   Height as of this encounter: 5\' 5"  (1.651 m).   Weight as of this encounter: 107 lb (48.5 kg). Last encounter: 03/03/2023. Last procedure: Visit date not found.  Reason for encounter:  Discussed the use of AI scribe software for clinical note transcription with the patient, who gave verbal consent to proceed.  History of Present Illness   The patient presents with increased back and left leg pain.  She experiences chronic back and left leg pain, which has recently increased. The pain is exacerbated by certain positions and is associated with broken veins and varicose veins in the affected areas. Red marks and broken veins appear after experiencing tension in these areas.  She is concerned about her medication use, specifically hydrocodone, and has difficulty remembering when she last took her dose. She is currently taking about three hydrocodone tablets a day and uses a pill box to help manage her medication schedule but still struggles with memory regarding her doses.  She is experiencing abdominal pain in the upper quadrant, which is currently being evaluated with an MRI. This pain is accompanied by frequent bouts of nausea, vomiting, and diarrhea, which are affected by her positioning and also impact her low back pain.  She reports unexplained weight loss and states she is currently 106 pounds without dieting. She mentions difficulty eating due to nausea, which has impacted her ability to maintain her weight.       Pharmacotherapy Assessment  Analgesic: Hydrocodone 7.5 mg QID-->TID PRN  Monitoring: [place] PMP: PDMP reviewed during this  encounter.       Pharmacotherapy: No side-effects or adverse reactions reported. Compliance: No problems identified. Effectiveness: Clinically acceptable.  Vernie Ammons, RN   03/31/2023  9:40 AM  Sign when Signing Visit Nursing Pain Medication Assessment:  Safety precautions to be maintained throughout the outpatient stay will include: orient to surroundings, keep bed in low position, maintain call bell within reach at all times, provide assistance with transfer out of bed and ambulation.  Medication Inspection Compliance: Pill count conducted under aseptic conditions, in front of the patient. Neither the pills nor the bottle was removed from the patient's sight at any time. Once count was completed pills were immediately returned to the patient in their original bottle.  Medication: Hydrocodone/APAP Pill/Patch Count:  102 of 120 pills remain Pill/Patch Appearance: Markings consistent with prescribed medication Bottle Appearance: Standard pharmacy container. Clearly labeled. Filled Date: 68 / 31 / 2024 Last Medication intake:  Yesterday  No results found for: "CBDTHCR" No results found for: "D8THCCBX" No results found for: "D9THCCBX"  UDS:  Summary  Date Value Ref Range Status  09/09/2022 Note  Final    Comment:    ==================================================================== [employer] Select 13 (MW) ==================================================================== Test                             Result       Flag       Units  Drug Present and Declared for Prescription Verification   Hydrocodone                    997          EXPECTED   ng/mg creat   Hydromorphone                  324          EXPECTED   ng/mg creat   Norhydrocodone                 934          EXPECTED   ng/mg creat    Sources of hydrocodone include scheduled prescription medications.    Hydromorphone and norhydrocodone are expected metabolites of    hydrocodone. Hydromorphone is also available as a scheduled    prescription medication.  Drug Present not Declared for Prescription Verification   Carboxy-THC                    34           UNEXPECTED ng/mg creat    Carboxy-THC  is a metabolite of tetrahydrocannabinol (THC). Source of    THC is most commonly herbal marijuana or marijuana-based products,    but THC is also present in a scheduled prescription medication.    Trace amounts of THC can be present in hemp and cannabidiol (CBD)    products. This test is not intended to distinguish between delta-9-    tetrahydrocannabinol, the predominant form of THC in most herbal or    marijuana-based products, and delta-8-tetrahydrocannabinol.  ==================================================================== Test                      Result    Flag   Units      Ref Range   Creatinine              62               mg/dL      >=  20 ==================================================================== Declared Medications:  The flagging and interpretation on this report are based on the  following declared medications.  Unexpected results may arise from  inaccuracies in the declared medications.   **Note: The testing scope of this panel includes these medications:   Hydrocodone (Norco)   **Note: The testing scope of this panel does not include the  following reported medications:   Acetaminophen (Norco)  Albuterol (Ventolin HFA)  Calcium  Fluoride (Prevident)  Gabapentin (Neurontin)  Magnesium  Nystatin (Mycostatin)  Tizanidine (Zanaflex)  Verapamil (Calan)  Vitamin D ==================================================================== For clinical consultation, please call [phone]. ====================================================================       ROS  Constitutional: Denies any fever or chills Gastrointestinal: No reported hemesis, hematochezia, vomiting, or acute GI distress Musculoskeletal:  as above Neurological: No reported episodes of acute onset apraxia, aphasia, dysarthria, agnosia, amnesia, paralysis, loss of coordination, or loss of consciousness  Medication Review  Calcium-Magnesium-Vitamin D, HYDROcodone-acetaminophen,  Lactulose, Sod Fluoride-Potassium Nitrate, albuterol, gabapentin, omeprazole, tiZANidine, and verapamil  History Review  Allergy: Ms. Eskin is allergic to latex, sulfa antibiotics, and linaclotide. Drug: Ms. Marcon  reports no history of drug use. Alcohol:  reports current alcohol use of about 7.0 standard drinks of alcohol per week. Tobacco:  reports that she quit smoking about 12 years ago. Her smoking use included cigarettes. She started smoking about 42 years ago. She has never used smokeless tobacco. Social: Ms. Closson  reports that she quit smoking about 12 years ago. Her smoking use included cigarettes. She started smoking about 42 years ago. She has never used smokeless tobacco. She reports current alcohol use of about 7.0 standard drinks of alcohol per week. She reports that she does not use drugs. Medical:  has a past medical history of Anxiety, Arthritis, Asthma, Blood in stool, Cervical radicular pain (11/16/2019), Chronic intermittent post-traumatic headache, Chronic neck pain with history of cervical spinal surgery (03/28/2017), Chronic radicular lumbar pain (08/07/2016), Colon polyps, Complication of anesthesia (2018), Depression, Failed back surgical syndrome (12/17/2021), Family history of adverse reaction to anesthesia, GERD (gastroesophageal reflux disease), Head injury, Headache, Hepatitis, Hepatitis C, History of blood transfusion, History of hepatitis C (03/27/2020), History of lumbar surgery (11/21/2019), Hypertension, Laryngopharyngeal reflux (LPR), Multiple falls, Oral lichen planus, Osteoarthritis (03/28/2017), Pancreatitis, Seizures (HCC), and Vitamin D deficiency. Surgical: Ms. Crumpler  has a past surgical history that includes Hernia repair; Cesarean section; Exploratory laparotomy; Tonsillectomy; Appendectomy; Colon resection; Colonoscopy; Anterior cervical decomp/discectomy fusion (N/A, 08/07/2016); Breast biopsy (Left); Abdominal hysterectomy (1979); acdf; Transforaminal lumbar  interbody fusion (tlif) with pedicle screw fixation 1 level (Right, 03/24/2018); low back surgery; Colonoscopy with propofol (N/A, 01/15/2023); Esophagogastroduodenoscopy (egd) with propofol (N/A, 01/15/2023); biopsy (01/15/2023); and polypectomy (01/15/2023). Family: family history includes Arthritis in her maternal grandmother and sister; Breast cancer (age of onset: 62) in her sister; Cancer in her maternal grandfather, mother, and sister; Diabetes in her paternal grandmother and sister; Heart disease in her mother; Heart failure in her mother; Hyperlipidemia in her maternal grandmother; Hypertension in her mother; Learning disabilities in her maternal grandmother; Miscarriages / [place] in her daughter, mother, sister, and sister; Multiple sclerosis in her father; Other in her sister.  Laboratory Chemistry Profile   Renal Lab Results  Component Value Date   BUN 10 03/19/2023   CREATININE 1.02 (H) 03/19/2023   BCR 10 (L) 03/19/2023   GFR 54.67 (L) 07/14/2022   GFRAA >60 03/18/2018   GFRNONAA >60 03/18/2018    Hepatic Lab Results  Component Value Date   AST 27 03/19/2023  ALT 16 03/19/2023   ALBUMIN 5.0 (H) 03/19/2023   ALKPHOS 70 03/19/2023   LIPASE 57 03/19/2023    Electrolytes Lab Results  Component Value Date   NA 142 03/19/2023   K 4.2 03/19/2023   CL 102 03/19/2023   CALCIUM 9.6 03/19/2023    Bone Lab Results  Component Value Date   VD25OH 119.53 (HH) 07/14/2022    Inflammation (CRP: Acute Phase) (ESR: Chronic Phase) No results found for: "CRP", "ESRSEDRATE", "LATICACIDVEN"       Note: Above Lab results reviewed.  Recent Imaging Review  CT ABDOMEN PELVIS W CONTRAST CLINICAL DATA:  Unintentional weight loss.  EXAM: CT ABDOMEN AND PELVIS WITH CONTRAST  TECHNIQUE: Multidetector CT imaging of the abdomen and pelvis was performed using the standard protocol following bolus administration of intravenous contrast.  RADIATION DOSE REDUCTION: This exam was  performed according to the departmental dose-optimization program which includes automated exposure control, adjustment of the mA and/or kV according to patient size and/or use of iterative reconstruction technique.  CONTRAST:  OMNIPAQUE IOHEXOL 300 MG/ML  SOLN  COMPARISON:  None Available.  FINDINGS: Lower Chest: No acute findings.  Hepatobiliary: A 6 mm homogeneous hypervascular lesion is seen in segment 8 of the right hepatic lobe on image 17/3. No other hepatic masses are identified. Gallbladder is unremarkable. No evidence of biliary ductal dilatation.  Pancreas:  No mass or inflammatory changes.  Spleen: Within normal limits in size and appearance.  Adrenals/Urinary Tract: No suspicious masses identified. No evidence of ureteral calculi or hydronephrosis.  Stomach/Bowel: No evidence of obstruction, inflammatory process or abnormal fluid collections. Large stool burden seen throughout the colon.  Vascular/Lymphatic: No pathologically enlarged lymph nodes. No acute vascular findings.  Reproductive: Prior hysterectomy noted. Adnexal regions are unremarkable in appearance.  Other:  None.  Musculoskeletal: No suspicious bone lesions identified. Lumbar spine fusion hardware noted at L4-5.  IMPRESSION: 6 mm hypervascular lesion in right hepatic lobe, with differential diagnosis including flash-filling hemangioma, focal nodular hyperplasia, or hepatic adenoma. Recommend follow-up by imaging in 6 months, preferably with abdomen MRI without and with Eovist contrast.  Large colonic stool burden noted; recommend clinical correlation for possible constipation.  Electronically Signed   By: Danae Orleans M.D.   On: 01/31/2023 17:00 Note: Reviewed        Physical Exam  General appearance: Well nourished, well developed, and well hydrated. In no apparent acute distress Mental status: Alert, oriented x 3 (person, place, & time)       Respiratory: No evidence of acute  respiratory distress Eyes: PERLA Vitals: BP (!) 82/64 (BP Location: Left Arm, Patient Position: Sitting, Cuff Size: Normal)   Pulse 86   Temp (!) 97.2 F (36.2 C) (Temporal)   Resp 16   Ht 5\' 5"  (1.651 m)   Wt 107 lb (48.5 kg)   SpO2 100%   BMI 17.81 kg/m  BMI: Estimated body mass index is 17.81 kg/m as calculated from the following:   Height as of this encounter: 5\' 5"  (1.651 m).   Weight as of this encounter: 107 lb (48.5 kg). Ideal: Ideal body weight: 57 kg (125 lb 10.6 oz)  Assessment   Diagnosis  1. Chronic radicular lumbar pain   2. Spinal stenosis, lumbar region, with neurogenic claudication   3. Post laminectomy syndrome   4. Failed back surgical syndrome   5. Chronic pain syndrome      Updated Problems: No problems updated.  Plan of Care  Problem-specific:  Assessment and Plan  Chronic Low Back Pain with Radiculopathy Chronic low back pain with radiculopathy to the left leg is reported with increased pain and difficulty managing hydrocodone. Currently taking about three tablets per day, there is difficulty remembering doses. The importance of the lowest effective dose and potential opioid side effects, including cognitive issues and abdominal pain, was discussed. Agreed to decrease the prescription to 90 tablets for the next three months to average three per day. Instructed to use a medication box and write down dose times to ensure proper intake. Follow up in three months.  Abdominal Pain New onset upper quadrant abdominal pain is being worked up by Dr. Daleen Squibb with an MRI. Frequent nausea, vomiting, and diarrhea are reported, exacerbated by positioning and impacting low back pain. Unexplained weight loss is noted. Discussed the potential impact of opioid medications on abdominal pain, including relaxation of smooth muscle contractions in the gut. Continue GI workup with Dr. Daleen Squibb and discuss the potential impact of opioid medications on abdominal pain with the GI  doctor.  Varicose Veins Reports of broken veins and red marks in areas of tension, possibly related to blood pressure changes and pancaking of veins, were noted. Explained that these symptoms could be due to micro-level hematomas in the veins. Educated on the potential causes and management of varicose veins.  Follow-up Follow up in three months. Next hydrocodone refill on March 3rd.       [name] has a current medication list which includes the following long-term medication(s): albuterol, calcium-magnesium-vitamin d, gabapentin, omeprazole, and verapamil.  Pharmacotherapy (Medications Ordered): Meds ordered this encounter  Medications   HYDROcodone-acetaminophen (NORCO) 7.5-325 MG tablet    Sig: Take 1 tablet by mouth every 8 (eight) hours as needed for severe pain (pain score 7-10). Must last 30 days.    Dispense:  90 tablet    Refill:  0    Chronic Pain: STOP Act (Not applicable) Fill 1 day early if closed on refill date. Avoid benzodiazepines within 8 hours of opioids   HYDROcodone-acetaminophen (NORCO) 7.5-325 MG tablet    Sig: Take 1 tablet by mouth every 8 (eight) hours as needed for severe pain (pain score 7-10). Must last 30 days.    Dispense:  90 tablet    Refill:  0    Chronic Pain: STOP Act (Not applicable) Fill 1 day early if closed on refill date. Avoid benzodiazepines within 8 hours of opioids   HYDROcodone-acetaminophen (NORCO) 7.5-325 MG tablet    Sig: Take 1 tablet by mouth every 8 (eight) hours as needed for severe pain (pain score 7-10). Must last 30 days.    Dispense:  90 tablet    Refill:  0    Chronic Pain: STOP Act (Not applicable) Fill 1 day early if closed on refill date. Avoid benzodiazepines within 8 hours of opioids   Orders:  No orders of the defined types were placed in this encounter.  Follow-up plan:   Return in about 4 months (around 07/29/2023) for MM, F2F.     Recent Visits Date Type Provider Dept  03/03/23 Office Visit Edward Jolly, MD [facility]  Showing recent visits within past 90 days and meeting all other requirements Today's Visits Date Type Provider Dept  03/31/23 Office Visit Edward Jolly, MD [facility]  Showing today's visits and meeting all other requirements Future Appointments No visits were found meeting these conditions. Showing future appointments within next 90 days and meeting all other requirements  I discussed the assessment  and treatment plan with the patient. The patient was provided an opportunity to ask questions and all were answered. The patient agreed with the plan and demonstrated an understanding of the instructions.  Patient advised to call back or seek an in-person evaluation if the symptoms or condition worsens.  Duration of encounter: .  Total time on encounter, as per AMA guidelines included both the face-to-face and non-face-to-face time personally spent by the physician and/or other qualified health care professional(s) on the day of the encounter (includes time in activities that require the physician or other qualified health care professional and does not include time in activities normally performed by clinical staff). Physician's time may include the following activities when performed: Preparing to see the patient (e.g., pre-charting review of records, searching for previously ordered imaging, lab work, and nerve conduction tests) Review of prior analgesic pharmacotherapies. Reviewing PMP Interpreting ordered tests (e.g., lab work, imaging, nerve conduction tests) Performing post-procedure evaluations, including interpretation of diagnostic procedures Obtaining and/or reviewing separately obtained history Performing a medically appropriate examination and/or evaluation Counseling and educating the patient/family/caregiver Ordering medications, tests, or procedures Referring and communicating with other health care professionals (when not  separately reported) Documenting clinical information in the electronic or other health record Independently interpreting results (not separately reported) and communicating results to the patient/ family/caregiver Care coordination (not separately reported)  Note by: Edward Jolly, MD Date: 03/31/2023; Time: 10:21 AM

## 2023-03-31 NOTE — Progress Notes (Signed)
Nursing Pain Medication Assessment:  Safety precautions to be maintained throughout the outpatient stay will include: orient to surroundings, keep bed in low position, maintain call bell within reach at all times, provide assistance with transfer out of bed and ambulation.  Medication Inspection Compliance: Pill count conducted under aseptic conditions, in front of the patient. Neither the pills nor the bottle was removed from the patient's sight at any time. Once count was completed pills were immediately returned to the patient in their original bottle.  Medication: Hydrocodone/APAP Pill/Patch Count:  102 of 120 pills remain Pill/Patch Appearance: Markings consistent with prescribed medication Bottle Appearance: Standard pharmacy container. Clearly labeled. Filled Date: 19 / 31 / 2024 Last Medication intake:  Yesterday

## 2023-04-07 NOTE — Progress Notes (Deleted)
 Amy Console, PA-C [address]  [address]  [place], [place] [address]  Main: [phone]  Fax: [phone]   Primary Care Physician: Hope Merle, MD  Primary Gastroenterologist:  Amy Console, PA-C / Dr. Rogelia Copping    CC: F/U N/V, Gastritis, Abd Pain, Constipation, Wt. Loss  HPI: GLADIE GRAVETTE is a 73 y.o. female, estab. Pt. Of Dr. Copping, returns for follow-up of nausea, vomiting, gastritis, abdominal pain, constipation, weight loss.  She was started on omeprazole  40 Mg once daily, and lactulose  30 mL once daily 2 weeks ago.  03/19/2023 labs: CBC, CMP, lipase, TSH, free T4: All normal.  03/31/2023 abdominal MRI (follow-up liver lesion): Small 6 mm benign focal nodular hyperplasia.  No further follow-up recommended.  01/2023 EGD by Dr. Copping: Small hiatal hernia, moderate gastritis, normal duodenum.  Biopsies negative for H. pylori.  Biopsy showed healing erosive gastritis.   01/2023 Colonoscopy: Excellent prep.  1 small sessile serrated and 1 small hyperplastic polyp removed.  Grade 1 nonbleeding internal hemorrhoids.  5-year repeat.   Previously treated for hepatitis C genotype Ia in 2019 with negative viral load in 2021.  01/2023 CT abdomen pelvis  Current Outpatient Medications  Medication Sig Dispense Refill   albuterol  (VENTOLIN  HFA) 108 (90 Base) MCG/ACT inhaler Inhale 1-2 puffs into the lungs every 6 (six) hours as needed for wheezing or shortness of breath. 18 g 11   Calcium -Magnesium-Vitamin D  (CALCIUM  1200+D3 PO) Take 2 tablets by mouth daily.     gabapentin  (NEURONTIN ) 300 MG capsule TAKE 2 CAPSULES(600 MG) BY MOUTH TWICE DAILY 240 capsule 3   [START ON 05/04/2023] HYDROcodone -acetaminophen  (NORCO) 7.5-325 MG tablet Take 1 tablet by mouth every 8 (eight) hours as needed for severe pain (pain score 7-10). Must last 30 days. 90 tablet 0   [START ON 06/03/2023] HYDROcodone -acetaminophen  (NORCO) 7.5-325 MG tablet Take 1 tablet by mouth every 8 (eight) hours as needed  for severe pain (pain score 7-10). Must last 30 days. 90 tablet 0   [START ON 07/03/2023] HYDROcodone -acetaminophen  (NORCO) 7.5-325 MG tablet Take 1 tablet by mouth every 8 (eight) hours as needed for severe pain (pain score 7-10). Must last 30 days. 90 tablet 0   Lactulose  20 GM/30ML SOLN Take 30 mLs (20 g total) by mouth daily. 900 mL 5   omeprazole  (PRILOSEC) 40 MG capsule Take 1 capsule (40 mg total) by mouth daily. 30 capsule 5   PREVIDENT  5000 SENSITIVE 1.1-5 % GEL Take 1 Application by mouth See admin instructions.     tiZANidine  (ZANAFLEX ) 4 MG tablet TAKE 1 TABLET(4 MG) BY MOUTH AT BEDTIME 30 tablet 3   verapamil  (CALAN -SR) 240 MG CR tablet TAKE 1 TABLET(240 MG) BY MOUTH AT BEDTIME 90 tablet 3   No current facility-administered medications for this visit.    Allergies as of 04/08/2023 - Review Complete 03/31/2023  Allergen Reaction Noted   Latex Hives 07/29/2016   Sulfa antibiotics Hives 08/23/2013   Linaclotide Itching 11/21/2013    Past Medical History:  Diagnosis Date   Anxiety    Arthritis    right hand, DDD L 4 , L 5   Asthma    Blood in stool    Cervical radicular pain 11/16/2019   Chronic intermittent post-traumatic headache    Chronic neck pain with history of cervical spinal surgery 03/28/2017   S/p surgery Dr. 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   Family history of adverse reaction to anesthesia    Sister woke up during  surgery   GERD (gastroesophageal reflux disease)    Head injury    Headache    h/o migraines    Hepatitis    Hepatitis C    treated with mavyret  per GI   History of blood transfusion    History of hepatitis C 03/27/2020   History of lumbar surgery 11/21/2019   Hypertension    Laryngopharyngeal reflux (LPR)    Multiple falls    Oral lichen planus    Osteoarthritis 03/28/2017   Pancreatitis    remote hx w/o h/o drinking at that time   Seizures [facility])     after head injury   Vitamin D  deficiency     Past Surgical History:  Procedure Laterality Date   ABDOMINAL HYSTERECTOMY  1979   has an ovary left s/p hysterectomy 1979; h/o abnormal pap    acdf     08/07/16 Dr. Beuford    ANTERIOR CERVICAL DECOMP/DISCECTOMY FUSION N/A 08/07/2016   Procedure: ANTERIOR CERVICAL DECOMPRESSION FUSION, CERVICAL FOUR-FIVE  WITH INSTRUMENTATION AND ALLOGRAFT;  Surgeon: Beuford Anes, MD;  Location: [facility] OR;  Service: Orthopedics;  Laterality: N/A;   APPENDECTOMY     BIOPSY  01/15/2023   Procedure: BIOPSY;  Surgeon: Jinny Carmine, MD;  Location: [facility] ENDOSCOPY;  Service: Endoscopy;;   BREAST BIOPSY Left    over 20 years ago negative left breast as of 03/2020    CESAREAN SECTION     COLON RESECTION     paritial ? reason done in [place]     COLONOSCOPY     COLONOSCOPY WITH PROPOFOL  N/A 01/15/2023   Procedure: COLONOSCOPY WITH PROPOFOL ;  Surgeon: Jinny Carmine, MD;  Location: [facility] ENDOSCOPY;  Service: Endoscopy;  Laterality: N/A;   ESOPHAGOGASTRODUODENOSCOPY (EGD) WITH PROPOFOL  N/A 01/15/2023   Procedure: ESOPHAGOGASTRODUODENOSCOPY (EGD) WITH PROPOFOL ;  Surgeon: Jinny Carmine, MD;  Location: [facility] ENDOSCOPY;  Service: Endoscopy;  Laterality: N/A;   EXPLORATORY LAPAROTOMY     after C Section  x 2 - 1 for bleeding and 1 for infection   HERNIA REPAIR     Inguinal - as a child   low back surgery     Dr. Beuford    POLYPECTOMY  01/15/2023   Procedure: POLYPECTOMY;  Surgeon: Jinny Carmine, MD;  Location: [facility]  ENDOSCOPY;  Service: Endoscopy;;   TONSILLECTOMY     TRANSFORAMINAL LUMBAR INTERBODY FUSION (TLIF) WITH PEDICLE SCREW FIXATION 1 LEVEL Right 03/24/2018   Procedure: RIGHT LUMBAR 4 - LUMBAR 5 TRANSFORAMINAL LUMBAR INTERBODY FUSION WITH INSTRUMENTATION AND ALLOGRAFT;  Surgeon: Beuford Anes, MD;  Location: [facility] OR;  Service: Orthopedics;  Laterality: Right;    Review of Systems:    All systems reviewed and negative except where noted in HPI.   Physical Examination:   There were no vitals taken for this visit.  General: Well-nourished, well-developed in no acute distress.  Lungs: Clear to auscultation bilaterally. Non-labored. Heart: Regular rate and rhythm, no murmurs rubs or gallops.  Abdomen: Bowel sounds are normal; Abdomen is Soft; No hepatosplenomegaly,  masses or hernias;  No Abdominal Tenderness; No guarding or rebound tenderness. Neuro: Alert and oriented x 3.  Grossly intact.  Psych: Alert and cooperative, normal mood and affect.   Imaging Studies: MR LIVER W WO CONTRAST Result Date: 03/31/2023 CLINICAL DATA:  Characterize liver lesion EXAM: MRI ABDOMEN WITHOUT AND WITH CONTRAST TECHNIQUE: Multiplanar multisequence MR imaging of the abdomen was performed both before and after the administration of intravenous contrast. CONTRAST:  5 mL Eovist  gadolinium contrast IV COMPARISON:  CT abdomen pelvis, 01/28/2023 FINDINGS: Lower chest: No acute abnormality. Hepatobiliary: Small arterially hyperenhancing lesion of the anterior right lobe of the liver hepatic segment VIII, measuring 0.6 x 0.6 cm (series 6, image 19). No appreciable underlying signal or diffusion abnormality, and this lesion is isoenhancing on all remaining contrast phases, including 20 minute delayed hepatobiliary phase. No gallstones, gallbladder wall thickening, or biliary dilatation. Pancreas: Unremarkable. No pancreatic ductal dilatation or surrounding inflammatory changes. Spleen: Normal in size without significant  abnormality. Adrenals/Urinary Tract: Adrenal glands are unremarkable. Kidneys are normal, without renal calculi, solid lesion, or hydronephrosis. Stomach/Bowel: Stomach is within normal limits. No evidence of bowel wall thickening, distention, or inflammatory changes. Vascular/Lymphatic: Aortic atherosclerosis. No enlarged abdominal lymph nodes. Other: No abdominal wall hernia or abnormality. No ascites. Musculoskeletal: No acute or significant osseous findings. IMPRESSION: 1. Small arterially hyperenhancing lesion of the anterior right lobe of the liver hepatic segment VIII, measuring 0.6 x 0.6 cm. No appreciable underlying signal or diffusion abnormality, and this lesion is isoenhancing on all remaining contrast phases, including 20 minute delayed hepatobiliary phase. This is most consistent with a small benign focal nodular hyperplasia. In the absence of known malignancy or chronic high risk liver disease (cirrhosis, chronic viral hepatitis), no further follow-up or characterization is required. 2.  No acute findings in the abdomen. Aortic Atherosclerosis (ICD10-I70.0). Electronically Signed   By: Marolyn JONETTA Jaksch M.D.   On: 03/31/2023 16:35    Assessment and Plan:   AMARILYS LYLES is a 73 y.o. y/o female returns for follow-up of nausea, vomiting, gastritis, abdominal pain, constipation, weight loss.  She was started on omeprazole  40 Mg once daily, and lactulose  30 mL once daily 2 weeks ago.  Recent liver MRI showed small 6 mm benign focal nodular hyperplasia.  Nothing worrisome.  EGD, colonoscopy, and abdominal pelvic CT done 01/2023 showed no etiology for weight loss.  Recent CBC, CMP, lipase, TSH labs normal.  1.  Gastritis  2.  GERD  Continue omeprazole  40 Mg daily  3.  Chronic constipation  Continue lactulose   4.  Benign focal nodular hyperplasia, 6 mm liver lesion.  Reassurance.  5.  Weight loss -stable.  Continue to monitor.  Cancel appt. 04/27/23.  Amy Console, PA-C  Follow up  ***    BP check ***

## 2023-04-08 ENCOUNTER — Ambulatory Visit: Payer: Medicare Other | Admitting: Physician Assistant

## 2023-04-27 ENCOUNTER — Ambulatory Visit (INDEPENDENT_AMBULATORY_CARE_PROVIDER_SITE_OTHER): Payer: Medicare Other | Admitting: Physician Assistant

## 2023-04-27 ENCOUNTER — Encounter: Payer: Self-pay | Admitting: Physician Assistant

## 2023-04-27 VITALS — BP 127/68 | HR 68 | Temp 98.0°F | Wt 112.0 lb

## 2023-04-27 DIAGNOSIS — K2930 Chronic superficial gastritis without bleeding: Secondary | ICD-10-CM

## 2023-04-27 DIAGNOSIS — K5909 Other constipation: Secondary | ICD-10-CM

## 2023-04-27 DIAGNOSIS — K2950 Unspecified chronic gastritis without bleeding: Secondary | ICD-10-CM

## 2023-04-27 DIAGNOSIS — K5900 Constipation, unspecified: Secondary | ICD-10-CM

## 2023-04-27 DIAGNOSIS — K769 Liver disease, unspecified: Secondary | ICD-10-CM

## 2023-04-27 NOTE — Progress Notes (Signed)
 Grant Amy, PA-C [address]  [address]  [place], [place] [address]  Main: [phone]  Fax: [phone]   Primary Care Physician: Grant Allan, MD  Primary Gastroenterologist:  Grant Amy, PA-C / Dr. Midge Grant    CC: Follow-up chronic constipation and gastritis  HPI: Grant Grant is a 73 y.o. female returns for 4-week follow-up of nausea, vomiting, abdominal pain, and weight loss.  Her weight is up several pounds in the past month.  She is feeling better.  She has no more diarrhea, nausea, or vomiting.  She is avoiding trigger foods such as hot sauce or spicy foods.  She is eating better.  Currently having 1 normal formed bowel movement daily.  She was unable to tolerate lactulose which caused diarrhea.  She is taking omeprazole 40 Mg every day which has helped.  She has no GI concerns today.    03/19/2023 labs: CBC, CMP, TSH, free T4, lipase - all normal except mild CKD with creatinine 1.02, GFR 59.  01/15/2023 EGD by Dr. Servando Grant: Small hiatal hernia, moderate gastritis, normal duodenum.  Biopsies negative for H. pylori.  Biopsy showed healing erosive gastritis.   01/15/2023 Colonoscopy: Excellent prep.  1 small sessile serrated and 1 small hyperplastic polyp removed.  Grade 1 nonbleeding internal hemorrhoids.  5-year repeat.   Previously treated for hepatitis C genotype Ia in 2019 with negative viral load in 2021.   01/28/2023 CT abdomen pelvis with contrast: 6 mm hypervascular lesion in right hepatic lobe, with differential diagnosis including flash-filling hemangioma, focal nodular hyperplasia, or hepatic adenoma. Recommend follow-up by imaging in 6 months, preferably with abdomen MRI without and with Eovist contrast.  Large colonic stool burden noted; recommend clinical correlation for possible constipation.  03/25/2023 MRI abdomen without and with contrast: 0.6 cm benign liver lesion consistent with focal nodular hyperplasia.  No further imaging recommended.  No  acute abnormality.  Current Outpatient Medications  Medication Sig Dispense Refill   albuterol (VENTOLIN HFA) 108 (90 Base) MCG/ACT inhaler Inhale 1-2 puffs into the lungs every 6 (six) hours as needed for wheezing or shortness of breath. 18 g 11   Calcium-Magnesium-Vitamin D (CALCIUM 1200+D3 PO) Take 2 tablets by mouth daily.     gabapentin (NEURONTIN) 300 MG capsule TAKE 2 CAPSULES(600 MG) BY MOUTH TWICE DAILY 240 capsule 3   [START ON 05/04/2023] HYDROcodone-acetaminophen (NORCO) 7.5-325 MG tablet Take 1 tablet by mouth every 8 (eight) hours as needed for severe pain (pain score 7-10). Must last 30 days. 90 tablet 0   [START ON 06/03/2023] HYDROcodone-acetaminophen (NORCO) 7.5-325 MG tablet Take 1 tablet by mouth every 8 (eight) hours as needed for severe pain (pain score 7-10). Must last 30 days. 90 tablet 0   [START ON 07/03/2023] HYDROcodone-acetaminophen (NORCO) 7.5-325 MG tablet Take 1 tablet by mouth every 8 (eight) hours as needed for severe pain (pain score 7-10). Must last 30 days. 90 tablet 0   Lactulose 20 GM/30ML SOLN Take 30 mLs (20 g total) by mouth daily. 900 mL 5   omeprazole (PRILOSEC) 40 MG capsule Take 1 capsule (40 mg total) by mouth daily. 30 capsule 5   PREVIDENT 5000 SENSITIVE 1.1-5 % GEL Take 1 Application by mouth See admin instructions.     tiZANidine (ZANAFLEX) 4 MG tablet TAKE 1 TABLET(4 MG) BY MOUTH AT BEDTIME 30 tablet 3   verapamil (CALAN-SR) 240 MG CR tablet TAKE 1 TABLET(240 MG) BY MOUTH AT BEDTIME 90 tablet 3   No current facility-administered medications  for this visit.    Allergies as of 04/27/2023 - Review Complete 04/27/2023  Allergen Reaction Noted   Latex Hives 07/29/2016   Sulfa antibiotics Hives 08/23/2013   Linaclotide Itching 11/21/2013    Past Medical History:  Diagnosis Date   Anxiety    Arthritis    right hand, DDD L 4 , L 5   Asthma    Blood in stool    Cervical radicular pain 11/16/2019   Chronic intermittent post-traumatic headache     Chronic neck pain with history of cervical spinal surgery 03/28/2017   S/p surgery Dr. 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   Family history of adverse reaction to anesthesia    Sister woke up during surgery   GERD (gastroesophageal reflux disease)    Head injury    Headache    h/o migraines    Hepatitis    Hepatitis C    treated with mavyret per GI   History of blood transfusion    History of hepatitis C 03/27/2020   History of lumbar surgery 11/21/2019   Hypertension    Laryngopharyngeal reflux (LPR)    Multiple falls    Oral lichen planus    Osteoarthritis 03/28/2017   Pancreatitis    remote hx w/o h/o drinking at that time   Seizures ([facility])     after head injury   Vitamin D deficiency     Past Surgical History:  Procedure Laterality Date   ABDOMINAL HYSTERECTOMY  1979   has an ovary left s/p hysterectomy 1979; h/o abnormal pap    acdf     08/07/16 Dr. Yevette Grant    ANTERIOR CERVICAL DECOMP/DISCECTOMY FUSION N/A 08/07/2016   Procedure: ANTERIOR CERVICAL DECOMPRESSION FUSION, CERVICAL FOUR-FIVE  WITH INSTRUMENTATION AND ALLOGRAFT;  Surgeon: Estill Bamberg, MD;  Location: [facility];  Service: Orthopedics;  Laterality: N/A;   APPENDECTOMY     BIOPSY  01/15/2023   Procedure: BIOPSY;  Surgeon: Grant Minium, MD;  Location: [facility] ENDOSCOPY;  Service: Endoscopy;;   BREAST BIOPSY Left    over 20 years ago negative left breast as of 03/2020    CESAREAN  SECTION     COLON RESECTION     paritial ? reason done in [place]    COLONOSCOPY     COLONOSCOPY WITH PROPOFOL N/A 01/15/2023   Procedure: COLONOSCOPY WITH PROPOFOL;  Surgeon: Grant Minium, MD;  Location: [facility] ENDOSCOPY;  Service: Endoscopy;  Laterality: N/A;   ESOPHAGOGASTRODUODENOSCOPY (EGD) WITH PROPOFOL N/A 01/15/2023   Procedure: ESOPHAGOGASTRODUODENOSCOPY (EGD) WITH PROPOFOL;  Surgeon: Grant Minium, MD;  Location: [facility] ENDOSCOPY;  Service: Endoscopy;  Laterality: N/A;   EXPLORATORY LAPAROTOMY     after C Section  x 2 - 1 for bleeding and 1 for infection   HERNIA REPAIR     Inguinal - as a child   low back surgery     Dr. Yevette Grant    POLYPECTOMY  01/15/2023   Procedure: POLYPECTOMY;  Surgeon: Grant Minium, MD;  Location: [facility] ENDOSCOPY;  Service: Endoscopy;;   TONSILLECTOMY     TRANSFORAMINAL LUMBAR INTERBODY FUSION (TLIF) WITH PEDICLE SCREW FIXATION 1 LEVEL Right 03/24/2018   Procedure: RIGHT LUMBAR 4 - LUMBAR 5 TRANSFORAMINAL LUMBAR INTERBODY FUSION WITH INSTRUMENTATION AND ALLOGRAFT;  Surgeon: Estill Bamberg, MD;  Location: [facility] OR;  Service: Orthopedics;  Laterality: Right;    Review of Systems:    All systems reviewed and negative except where noted in HPI.   Physical Examination:   BP 127/68 (BP Location: Left Arm, Patient Position: Sitting, Cuff Size: Normal)   Pulse 68   Temp 98 F (36.7 C) (Oral)   Wt 112 lb (50.8 kg)   BMI 18.64 kg/m   General: Well-nourished, well-developed in no acute distress.  Neuro: Alert and oriented x 3.  Grossly intact.  Psych: Alert and cooperative, normal mood and affect.   Imaging Studies: No results found.  Assessment and Plan:   Grant Grant is a 73 y.o. y/o female returns for follow-up of chronic abdominal pain, nausea, vomiting, and constipation.  Symptoms most consistent with chronic idiopathic constipation, IBS-C, GERD and gastritis.  Extensive GI evaluation unrevealing including EGD, colonoscopy, CT, MRI, and labs.  1.   Chronic constipation  Continue lactulose 30 mL daily  She is allergic to Linzess.  Try Trulance?  2.  Chronic gastritis (H. Pylori Negative)  Continue omeprazole 40 Mg daily  Avoid NSAIDs.  3.  Benign small 6 mm liver lesion; consistent with focal nodular hyperplasia on MRI  Reassurance  Grant Amy, PA-C  Follow up as needed if recurrent GI symptoms.

## 2023-05-19 ENCOUNTER — Encounter: Payer: Medicare Other | Admitting: Student in an Organized Health Care Education/Training Program

## 2023-05-28 ENCOUNTER — Encounter: Payer: Medicare Other | Admitting: Student in an Organized Health Care Education/Training Program

## 2023-06-20 ENCOUNTER — Other Ambulatory Visit: Payer: Self-pay | Admitting: Family Medicine

## 2023-06-20 DIAGNOSIS — M5410 Radiculopathy, site unspecified: Secondary | ICD-10-CM

## 2023-06-30 ENCOUNTER — Other Ambulatory Visit: Payer: Self-pay | Admitting: Family Medicine

## 2023-06-30 DIAGNOSIS — M5416 Radiculopathy, lumbar region: Secondary | ICD-10-CM

## 2023-07-13 ENCOUNTER — Telehealth: Payer: Self-pay

## 2023-07-13 ENCOUNTER — Telehealth: Payer: Self-pay | Admitting: Physician Assistant

## 2023-07-13 MED ORDER — OMEPRAZOLE 40 MG PO CPDR
40.0000 mg | DELAYED_RELEASE_CAPSULE | Freq: Every day | ORAL | 3 refills | Status: AC
Start: 2023-07-13 — End: ?

## 2023-07-13 NOTE — Telephone Encounter (Signed)
 Patient requesting Omeprazole  40 mg daily to be changed to 90 day supply-Omeprazole  40 mg daily sent RX to pharmacy.

## 2023-07-13 NOTE — Telephone Encounter (Signed)
 The patient called in wanting to speak to the nurse.

## 2023-07-16 ENCOUNTER — Encounter: Admitting: Nurse Practitioner

## 2023-07-20 ENCOUNTER — Telehealth: Payer: Self-pay

## 2023-07-20 NOTE — Telephone Encounter (Signed)
 Pt scheduled an appointment for 07/21/2023 with Dr. Sueanne Emerald.

## 2023-07-20 NOTE — Telephone Encounter (Signed)
 Copied from CRM [id number]. Topic: Referral - Request for Referral >> Jul 20, 2023  2:30 PM Adonis Hoot wrote: Did the patient discuss referral with their provider in the last year? No (If No - schedule appointment) (If Yes - send message)  Appointment offered? Yes/issues with right eye  Type of order/referral and detailed reason for visit: opthalmology-possible cataracts   Preference of office, provider, location: near [place]  If referral order, have you been seen by this specialty before? No (If Yes, this issue or another issue? When? Where?  Can we respond through MyChart? Yes

## 2023-07-21 ENCOUNTER — Ambulatory Visit (INDEPENDENT_AMBULATORY_CARE_PROVIDER_SITE_OTHER): Admitting: Family Medicine

## 2023-07-21 VITALS — BP 116/74 | HR 78 | Temp 98.2°F | Resp 20 | Ht 64.0 in | Wt 117.1 lb

## 2023-07-21 DIAGNOSIS — H269 Unspecified cataract: Secondary | ICD-10-CM

## 2023-07-21 NOTE — Patient Instructions (Addendum)
 It was a pleasure meeting you today. Thank you for allowing me to take part in your health care.  Our goals for today as we discussed include:  Call [facility] to schedule appointment for evaluation [phone] [address] [place], [place] [address]  Can send referral if needed    This is a list of the screening recommended for you and due dates:  Health Maintenance  Topic Date Due   DTaP/Tdap/Td vaccine (1 - Tdap) Never done   COVID-19 Vaccine (4 - 2024-25 season) 11/02/2022   Flu Shot  10/02/2023   Mammogram  11/14/2023   Medicare Annual Wellness Visit  11/25/2023   Colon Cancer Screening  01/15/2028   DEXA scan (bone density measurement)  Completed   Hepatitis C Screening  Completed   HPV Vaccine  Aged Out   Meningitis B Vaccine  Aged Out   Pneumonia Vaccine  Discontinued   Zoster (Shingles) Vaccine  Discontinued      If you have any questions or concerns, please do not hesitate to call the office at [phone].  I look forward to our next visit and until then take care and stay safe.  Regards,   Valli Gaw, MD   [facility]

## 2023-07-23 ENCOUNTER — Encounter: Payer: Medicare Other | Admitting: Student in an Organized Health Care Education/Training Program

## 2023-07-27 ENCOUNTER — Encounter: Payer: Self-pay | Admitting: Family Medicine

## 2023-07-27 DIAGNOSIS — H269 Unspecified cataract: Secondary | ICD-10-CM | POA: Insufficient documentation

## 2023-07-27 NOTE — Assessment & Plan Note (Addendum)
 Chronic bilateral cataracts, greater in the right eye with significant visual impairment. Patient anxious about potential surgery due to past negative experiences. Reports symptoms are similar to previous symptoms and is now ready to have intervention.  Recently had eye exam by Optometrist and new glasses.  Wants referral to ophthalmology.   - Refer to ophthalmologist for cataract evaluation and management.   Addendum Patient has scheduled appointment at [facility] for 05/30 Symptoms have not improved but not worsening

## 2023-07-27 NOTE — Progress Notes (Signed)
 SUBJECTIVE:   Chief Complaint  Patient presents with   Eye Problem    Something in her line of vision in the right eye. A lot of black dots. Small circle in the eye that is blurry.    HPI Presents for acute visit  Discussed the use of AI scribe software for clinical note transcription with the patient, who gave verbal consent to proceed.  History of Present Illness Amy Grant is a 73 year old female with cataracts who presents with visual disturbances in the right eye. Symptoms have been ongoing for several years but have recently progressed.  She experiences visual disturbances in her right eye, describing a 'little thing' in her line of vision that moves with her eye movements. There is also a circle below this area where everything appears blurred, which also moves with her vision. These symptoms have been gradually worsening, leading her to occasionally react to things that are not there, such as slapping at the air or preparing to step on something nonexistent.  She sees numerous tiny black dots when looking upwards, but she can still see the wall and ceiling. She has attempted to use eye drops to alleviate the symptoms, but they have not been effective. No complete loss of vision is reported, but there is blurry vision in the affected eye.  She has a history of cataracts in both eyes, diagnosed approximately eight years ago at [facility]. Occasional sharp pain in the right eye is noted, but it is not constant. No complete blackout of vision or a curtain-like effect over her eye is reported, except for an incident 20 years ago related to a sinus infection, which resolved after sinus surgery.  She experiences headaches that have started in the last few months, which she attributes to stress related to family issues and financial concerns. She has not had any recent eye examinations for glasses, with the last one being over a year ago, where she was told by a computerized  system that she did not have cataracts.     PERTINENT PMH / PSH: As above  OBJECTIVE:  BP 116/74   Pulse 78   Temp 98.2 F (36.8 C)   Resp 20   Ht 5\' 4"  (1.626 m)   Wt 117 lb 2 oz (53.1 kg)   SpO2 99%   BMI 20.10 kg/m    Physical Exam Vitals reviewed.  Constitutional:      General: She is not in acute distress.    Appearance: Normal appearance. She is normal weight. She is not ill-appearing, toxic-appearing or diaphoretic.  Eyes:     General: Lids are normal. Gaze aligned appropriately. No visual field deficit or scleral icterus.       Right eye: No discharge.        Left eye: No discharge.     Extraocular Movements: Extraocular movements intact.     Right eye: No nystagmus.     Left eye: No nystagmus.     Conjunctiva/sclera: Conjunctivae normal.     Right eye: Right conjunctiva is not injected. No hemorrhage.    Left eye: Left conjunctiva is not injected. No hemorrhage.    Comments: Fundoscopy exam normal  Cardiovascular:     Rate and Rhythm: Normal rate and regular rhythm.     Heart sounds: Normal heart sounds.  Pulmonary:     Effort: Pulmonary effort is normal.     Breath sounds: Normal breath sounds.  Abdominal:     General: Bowel  sounds are normal.  Musculoskeletal:        General: Normal range of motion.  Skin:    General: Skin is warm and dry.  Neurological:     General: No focal deficit present.     Mental Status: She is alert and oriented to person, place, and time. Mental status is at baseline.  Psychiatric:        Mood and Affect: Mood normal.        Behavior: Behavior normal.        Thought Content: Thought content normal.        Judgment: Judgment normal.           07/21/2023    2:43 PM 12/04/2022    2:56 PM 11/25/2022    3:53 PM 07/30/2022    3:50 PM 04/21/2022    2:41 PM  Depression screen PHQ 2/9  Decreased Interest 0 0 0 1 0  Down, Depressed, Hopeless 1 0 0 1 0  PHQ - 2 Score 1 0 0 2 0  Altered sleeping 1  3 3    Tired, decreased  energy 1  0 2   Change in appetite 2  3 1    Feeling bad or failure about yourself  0  0 2   Trouble concentrating 0  0 0   Moving slowly or fidgety/restless 0  0 0   Suicidal thoughts 0  0 0   PHQ-9 Score 5  6 10    Difficult doing work/chores Not difficult at all  Not difficult at all Not difficult at all       07/21/2023    2:43 PM 07/30/2022    3:50 PM 12/09/2019    1:36 PM 09/29/2019    2:04 PM  GAD 7 : Generalized Anxiety Score  Nervous, Anxious, on Edge 1 0 0 0  Control/stop worrying 1 2 0 0  Worry too much - different things 2 2 0 0  Trouble relaxing 2 1 0 0  Restless 0 0 0 0  Easily annoyed or irritable 1 1 0 0  Afraid - awful might happen 1 1 0 0  Total GAD 7 Score 8 7 0 0  Anxiety Difficulty  Not difficult at all  Not difficult at all    ASSESSMENT/PLAN:  Cataract of both eyes, unspecified cataract type Assessment & Plan: Chronic bilateral cataracts, greater in the right eye with significant visual impairment. Patient anxious about potential surgery due to past negative experiences. Reports symptoms are similar to previous symptoms and is now ready to have intervention.  Recently had eye exam by Optometrist and new glasses.  Wants referral to ophthalmology.   - Refer to ophthalmologist for cataract evaluation and management.   Addendum Patient has scheduled appointment at [facility] for 05/30 Symptoms have not improved but not worsening     PDMP reviewed  Return if symptoms worsen or fail to improve, for PCP.  Valli Gaw, MD

## 2023-07-31 DIAGNOSIS — H25013 Cortical age-related cataract, bilateral: Secondary | ICD-10-CM | POA: Diagnosis not present

## 2023-07-31 DIAGNOSIS — H538 Other visual disturbances: Secondary | ICD-10-CM | POA: Diagnosis not present

## 2023-07-31 DIAGNOSIS — H04123 Dry eye syndrome of bilateral lacrimal glands: Secondary | ICD-10-CM | POA: Diagnosis not present

## 2023-07-31 DIAGNOSIS — H2511 Age-related nuclear cataract, right eye: Secondary | ICD-10-CM | POA: Diagnosis not present

## 2023-07-31 DIAGNOSIS — H43811 Vitreous degeneration, right eye: Secondary | ICD-10-CM | POA: Diagnosis not present

## 2023-07-31 DIAGNOSIS — H2513 Age-related nuclear cataract, bilateral: Secondary | ICD-10-CM | POA: Diagnosis not present

## 2023-08-10 DIAGNOSIS — H04123 Dry eye syndrome of bilateral lacrimal glands: Secondary | ICD-10-CM | POA: Diagnosis not present

## 2023-08-10 DIAGNOSIS — H2513 Age-related nuclear cataract, bilateral: Secondary | ICD-10-CM | POA: Diagnosis not present

## 2023-08-10 DIAGNOSIS — H43811 Vitreous degeneration, right eye: Secondary | ICD-10-CM | POA: Diagnosis not present

## 2023-08-10 DIAGNOSIS — H2511 Age-related nuclear cataract, right eye: Secondary | ICD-10-CM | POA: Diagnosis not present

## 2023-08-10 DIAGNOSIS — H25013 Cortical age-related cataract, bilateral: Secondary | ICD-10-CM | POA: Diagnosis not present

## 2023-08-11 ENCOUNTER — Encounter: Payer: Self-pay | Admitting: Ophthalmology

## 2023-08-17 NOTE — Discharge Instructions (Signed)

## 2023-08-19 ENCOUNTER — Encounter
Admission: RE | Disposition: A | Discharge status: Home / Self Care | Payer: Self-pay | Source: Home / Self Care | Attending: Ophthalmology

## 2023-08-19 ENCOUNTER — Other Ambulatory Visit: Payer: Self-pay

## 2023-08-19 ENCOUNTER — Ambulatory Visit: Payer: Self-pay | Admitting: Anesthesiology

## 2023-08-19 ENCOUNTER — Ambulatory Visit
Admission: RE | Admit: 2023-08-19 | Discharge: 2023-08-19 | Disposition: A | Discharge status: Home / Self Care | Attending: Ophthalmology | Admitting: Ophthalmology

## 2023-08-19 ENCOUNTER — Encounter: Payer: Self-pay | Admitting: Ophthalmology

## 2023-08-19 DIAGNOSIS — Z5986 Financial insecurity: Secondary | ICD-10-CM | POA: Insufficient documentation

## 2023-08-19 DIAGNOSIS — Z79899 Other long term (current) drug therapy: Secondary | ICD-10-CM | POA: Insufficient documentation

## 2023-08-19 DIAGNOSIS — I10 Essential (primary) hypertension: Secondary | ICD-10-CM | POA: Diagnosis not present

## 2023-08-19 DIAGNOSIS — Z87891 Personal history of nicotine dependence: Secondary | ICD-10-CM | POA: Insufficient documentation

## 2023-08-19 DIAGNOSIS — H25011 Cortical age-related cataract, right eye: Secondary | ICD-10-CM | POA: Diagnosis not present

## 2023-08-19 DIAGNOSIS — H2511 Age-related nuclear cataract, right eye: Secondary | ICD-10-CM | POA: Diagnosis not present

## 2023-08-19 DIAGNOSIS — Z8619 Personal history of other infectious and parasitic diseases: Secondary | ICD-10-CM | POA: Diagnosis not present

## 2023-08-19 DIAGNOSIS — K219 Gastro-esophageal reflux disease without esophagitis: Secondary | ICD-10-CM | POA: Diagnosis not present

## 2023-08-19 DIAGNOSIS — J45909 Unspecified asthma, uncomplicated: Secondary | ICD-10-CM | POA: Insufficient documentation

## 2023-08-19 HISTORY — PX: CATARACT EXTRACTION W/PHACO: SHX586

## 2023-08-19 SURGERY — PHACOEMULSIFICATION, CATARACT, WITH IOL INSERTION
Anesthesia: Monitor Anesthesia Care | Site: Eye | Laterality: Right

## 2023-08-19 MED ORDER — DEXMEDETOMIDINE HCL IN NACL 80 MCG/20ML IV SOLN
INTRAVENOUS | Status: DC | PRN
  Administered 2023-08-19: 4 ug via INTRAVENOUS

## 2023-08-19 MED ORDER — MIDAZOLAM HCL 2 MG/2ML IJ SOLN
INTRAMUSCULAR | Status: DC | PRN
Start: 2023-08-19 — End: 2023-08-19
  Administered 2023-08-19: 2 mg via INTRAVENOUS

## 2023-08-19 MED ORDER — SIGHTPATH DOSE#1 BSS IO SOLN
INTRAOCULAR | Status: DC | PRN
  Administered 2023-08-19: 15 mL via INTRAOCULAR

## 2023-08-19 MED ORDER — SIGHTPATH DOSE#1 NA HYALUR & NA CHOND-NA HYALUR IO KIT
PACK | INTRAOCULAR | Status: DC | PRN
  Administered 2023-08-19: 1 via OPHTHALMIC

## 2023-08-19 MED ORDER — LIDOCAINE HCL (PF) 2 % IJ SOLN
INTRAOCULAR | Status: DC | PRN
  Administered 2023-08-19: 2 mL

## 2023-08-19 MED ORDER — TETRACAINE HCL 0.5 % OP SOLN
1.0000 [drp] | OPHTHALMIC | Status: DC | PRN
  Administered 2023-08-19 (×3): 1 [drp] via OPHTHALMIC

## 2023-08-19 MED ORDER — BRIMONIDINE TARTRATE-TIMOLOL 0.2-0.5 % OP SOLN
OPHTHALMIC | Status: DC | PRN
  Administered 2023-08-19: 1 [drp] via OPHTHALMIC

## 2023-08-19 MED ORDER — TETRACAINE HCL 0.5 % OP SOLN
OPHTHALMIC | Status: AC
  Filled 2023-08-19: qty 4

## 2023-08-19 MED ORDER — CEFUROXIME OPHTHALMIC INJECTION 1 MG/0.1 ML
INJECTION | OPHTHALMIC | Status: DC | PRN
  Administered 2023-08-19: .1 mL via INTRACAMERAL

## 2023-08-19 MED ORDER — FENTANYL CITRATE (PF) 100 MCG/2ML IJ SOLN
INTRAMUSCULAR | Status: DC | PRN
  Administered 2023-08-19: 50 ug via INTRAVENOUS

## 2023-08-19 MED ORDER — FENTANYL CITRATE (PF) 100 MCG/2ML IJ SOLN
INTRAMUSCULAR | Status: AC
Start: 2023-08-19 — End: 2023-08-19
  Filled 2023-08-19: qty 2

## 2023-08-19 MED ORDER — SIGHTPATH DOSE#1 BSS IO SOLN
INTRAOCULAR | Status: DC | PRN
  Administered 2023-08-19: 63 mL via OPHTHALMIC

## 2023-08-19 MED ORDER — ARMC OPHTHALMIC DILATING DROPS
OPHTHALMIC | Status: AC
Start: 2023-08-19 — End: 2023-08-19
  Filled 2023-08-19: qty 0.5

## 2023-08-19 MED ORDER — ARMC OPHTHALMIC DILATING DROPS
1.0000 | OPHTHALMIC | Status: DC | PRN
  Administered 2023-08-19 (×3): 1 via OPHTHALMIC

## 2023-08-19 MED ORDER — MIDAZOLAM HCL 2 MG/2ML IJ SOLN
INTRAMUSCULAR | Status: AC
Start: 2023-08-19 — End: 2023-08-19
  Filled 2023-08-19: qty 2

## 2023-08-19 SURGICAL SUPPLY — 10 items
CATARACT SUITE SIGHTPATH (MISCELLANEOUS) ×1 IMPLANT
FEE CATARACT SUITE SIGHTPATH (MISCELLANEOUS) ×1 IMPLANT
GLOVE BIOGEL PI IND STRL 8 (GLOVE) ×1 IMPLANT
GLOVE PI ULTRA LF STRL 7.5 (GLOVE) IMPLANT
GLOVE SURG PROTEXIS BL SZ6.5 (GLOVE) ×1 IMPLANT
GLOVE SURG SYN 6.5 PF PI BL (GLOVE) ×1 IMPLANT
LENS IOL TECNIS EYHANCE 19.5 (Intraocular Lens) IMPLANT
NDL FILTER BLUNT 18X1 1/2 (NEEDLE) ×1 IMPLANT
NEEDLE FILTER BLUNT 18X1 1/2 (NEEDLE) ×1 IMPLANT
SYR 3ML LL SCALE MARK (SYRINGE) ×1 IMPLANT

## 2023-08-19 NOTE — Transfer of Care (Signed)
 Immediate Anesthesia Transfer of Care Note  Patient: Amy Grant  Procedure(s) Performed: PHACOEMULSIFICATION, CATARACT, WITH IOL INSERTION 6.04 00:48.3 (Right: Eye)  Patient Location: PACU  Anesthesia Type: MAC  Level of Consciousness: awake, alert  and patient cooperative  Airway and Oxygen Therapy: Patient Spontanous Breathing and Patient connected to supplemental oxygen  Post-op Assessment: Post-op Vital signs reviewed, Patient's Cardiovascular Status Stable, Respiratory Function Stable, Patent Airway and No signs of Nausea or vomiting  Post-op Vital Signs: Reviewed and stable  Complications: No notable events documented.

## 2023-08-19 NOTE — Anesthesia Preprocedure Evaluation (Addendum)
 Anesthesia Evaluation  Patient identified by MRN, date of birth, ID band Patient awake    Reviewed: Allergy & Precautions, NPO status , Patient's Chart, lab work & pertinent test results  Airway Mallampati: II  TM Distance: >3 FB Neck ROM: Full    Dental  (+) Upper Dentures, Partial Lower   Pulmonary asthma , former smoker   Pulmonary exam normal breath sounds clear to auscultation       Cardiovascular Exercise Tolerance: Good hypertension, Pt. on medications Normal cardiovascular exam Rhythm:Regular Rate:Normal     Neuro/Psych Seizures -,   Anxiety        GI/Hepatic ,GERD  Medicated,,(+) Hepatitis -, C  Endo/Other  negative endocrine ROS    Renal/GU negative Renal ROS  negative genitourinary   Musculoskeletal   Abdominal Normal abdominal exam  (+)   Peds negative pediatric ROS (+)  Hematology negative hematology ROS (+)   Anesthesia Other Findings Past Medical History: No date: Anxiety No date: Arthritis     Comment:  right hand, DDD L 4 , L 5 No date: Asthma No date: Blood in stool 11/16/2019: Cervical radicular pain No date: Chronic intermittent post-traumatic headache 03/28/2017: Chronic neck pain with history of cervical spinal surgery     Comment:  S/p surgery Dr. Jackee Marus 08/07/2016: Chronic radicular lumbar pain     Comment:  Reviewed MRI lumbar 04/2016 degenerative changes worse               L5-S1 disc desiccation with ht loss, b/l facet effusion,               synovial cysts, mild facet arthropathy, disc bulging and               arthropathy at some areas marked also marked right neural              foraminal narrowing with compression of exiting nerve               rool L4/5, L5/S1 disc bulge and b/l foraminal extension               of marked facet arthropathy>>>severe b/l neural foraminal              stenos No date: Colon polyps 2018: Complication of anesthesia     Comment:  difficulty  waking up  No date: Depression 12/17/2021: Failed back surgical syndrome No date: Family history of adverse reaction to anesthesia     Comment:  Sister woke up during surgery No date: GERD (gastroesophageal reflux disease) No date: Head injury No date: Headache     Comment:  h/o migraines  No date: Hepatitis No date: Hepatitis C     Comment:  treated with mavyret  per GI No date: History of blood transfusion 03/27/2020: History of hepatitis C 11/21/2019: History of lumbar surgery No date: Hypertension No date: Laryngopharyngeal reflux (LPR) No date: Multiple falls No date: Oral lichen planus 03/28/2017: Osteoarthritis No date: Pancreatitis     Comment:  remote hx w/o h/o drinking at that time No date: Seizures (HCC)     Comment:   after head injury No date: Vitamin D  deficiency  Past Surgical History: 1979: ABDOMINAL HYSTERECTOMY     Comment:  has an ovary left s/p hysterectomy 1979; h/o abnormal               pap  No date: acdf     Comment:  08/07/16 Dr. Jackee Marus  08/07/2016: ANTERIOR CERVICAL DECOMP/DISCECTOMY FUSION; N/A  Comment:  Procedure: ANTERIOR CERVICAL DECOMPRESSION FUSION,               CERVICAL FOUR-FIVE  WITH INSTRUMENTATION AND ALLOGRAFT;                Surgeon: Virl Grimes, MD;  Location: [facility] OR;  Service:               Orthopedics;  Laterality: N/A; No date: APPENDECTOMY No date: BREAST BIOPSY; Left     Comment:  over 20 years ago negative left breast as of 03/2020  No date: CESAREAN SECTION No date: COLON RESECTION     Comment:  paritial ? reason done in [place]   No date: COLONOSCOPY No date: EXPLORATORY LAPAROTOMY     Comment:  after C Section  x 2 - 1 for bleeding and 1 for               infection No date: HERNIA REPAIR     Comment:  Inguinal - as a child No date: low back surgery     Comment:  Dr. Jackee Marus  No date: TONSILLECTOMY 03/24/2018: TRANSFORAMINAL LUMBAR INTERBODY FUSION (TLIF) WITH PEDICLE  SCREW FIXATION 1 LEVEL; Right     Comment:   Procedure: RIGHT LUMBAR 4 - LUMBAR 5 TRANSFORAMINAL               LUMBAR INTERBODY FUSION WITH INSTRUMENTATION AND               ALLOGRAFT;  Surgeon: Virl Grimes, MD;  Location: [facility]               OR;  Service: Orthopedics;  Laterality: Right;  BMI    Body Mass Index: 18.97 kg/m      Reproductive/Obstetrics negative OB ROS                             Anesthesia Physical Anesthesia Plan  ASA: 2  Anesthesia Plan: MAC   Post-op Pain Management:    Induction: Intravenous  PONV Risk Score and Plan:   Airway Management Planned: Natural Airway  Additional Equipment:   Intra-op Plan:   Post-operative Plan:   Informed Consent: I have reviewed the patients History and Physical, chart, labs and discussed the procedure including the risks, benefits and alternatives for the proposed anesthesia with the patient or authorized representative who has indicated his/her understanding and acceptance.       Plan Discussed with: CRNA and Surgeon  Anesthesia Plan Comments:         Anesthesia Quick Evaluation

## 2023-08-19 NOTE — H&P (Signed)
 [facility]   Primary Care Physician:  Valli Gaw, MD Ophthalmologist: Dr. Annell Kidney  Pre-Procedure History & Physical: HPI:  Amy Grant is a 73 y.o. female here for ophthalmic surgery.   Past Medical History:  Diagnosis Date   Anxiety    Arthritis    right hand, DDD L 4 , L 5   Asthma    Blood in stool    Cervical radicular pain 11/16/2019   Chronic intermittent post-traumatic headache    Chronic neck pain with history of cervical spinal surgery 03/28/2017   S/p surgery Dr. Jackee Marus   Chronic radicular lumbar pain 08/07/2016   Reviewed MRI lumbar 04/2016 degenerative changes worse L5-S1 disc desiccation with ht loss, b/l facet effusion, synovial cysts, mild facet arthropathy, disc bulging and arthropathy at some areas marked also marked right neural foraminal narrowing with compression of exiting nerve rool L4/5, L5/S1 disc bulge and b/l foraminal extension of marked facet arthropathy>>>severe b/l neural foraminal stenos   Colon polyps    Complication of anesthesia 2018   difficulty waking up    Depression    Failed back surgical syndrome 12/17/2021   Family history of adverse reaction to anesthesia    Sister woke up during surgery   GERD (gastroesophageal reflux disease)    Head injury    Headache    h/o migraines    Hepatitis    Hepatitis C    treated with mavyret  per GI   History of blood transfusion    History of hepatitis C 03/27/2020   History of lumbar surgery 11/21/2019   Hypertension    Laryngopharyngeal reflux (LPR)    Multiple falls    Oral lichen planus    Osteoarthritis 03/28/2017   Pancreatitis    remote hx w/o h/o drinking at that time   Seizures (HCC)     after head injury   Vitamin D  deficiency     Past Surgical History:  Procedure Laterality Date   ABDOMINAL HYSTERECTOMY  1979   has an ovary left s/p hysterectomy 1979; h/o abnormal pap    acdf     08/07/16 Dr. Jackee Marus    ANTERIOR CERVICAL DECOMP/DISCECTOMY FUSION N/A  08/07/2016   Procedure: ANTERIOR CERVICAL DECOMPRESSION FUSION, CERVICAL FOUR-FIVE  WITH INSTRUMENTATION AND ALLOGRAFT;  Surgeon: Virl Grimes, MD;  Location: [facility] OR;  Service: Orthopedics;  Laterality: N/A;   APPENDECTOMY     BIOPSY  01/15/2023   Procedure: BIOPSY;  Surgeon: Marnee Sink, MD;  Location: [facility] ENDOSCOPY;  Service: Endoscopy;;   BREAST BIOPSY Left    over 20 years ago negative left breast as of 03/2020    CESAREAN SECTION     COLON RESECTION     paritial ? reason done in [place]     COLONOSCOPY     COLONOSCOPY WITH PROPOFOL  N/A 01/15/2023   Procedure: COLONOSCOPY WITH PROPOFOL ;  Surgeon: Marnee Sink, MD;  Location: [facility] ENDOSCOPY;  Service: Endoscopy;  Laterality: N/A;   ESOPHAGOGASTRODUODENOSCOPY (EGD) WITH PROPOFOL  N/A 01/15/2023   Procedure: ESOPHAGOGASTRODUODENOSCOPY (EGD) WITH PROPOFOL ;  Surgeon: Marnee Sink, MD;  Location: [facility] ENDOSCOPY;  Service: Endoscopy;  Laterality: N/A;   EXPLORATORY LAPAROTOMY     after C Section  x 2 - 1 for bleeding and 1 for infection   HERNIA REPAIR     Inguinal - as a child   low back surgery     Dr. Jackee Marus    POLYPECTOMY  01/15/2023   Procedure: POLYPECTOMY;  Surgeon: Marnee Sink, MD;  Location: [facility] ENDOSCOPY;  Service: Endoscopy;;   TONSILLECTOMY     TRANSFORAMINAL LUMBAR INTERBODY FUSION (TLIF) WITH PEDICLE SCREW FIXATION 1 LEVEL Right 03/24/2018   Procedure: RIGHT LUMBAR 4 - LUMBAR 5 TRANSFORAMINAL LUMBAR INTERBODY FUSION WITH INSTRUMENTATION AND ALLOGRAFT;  Surgeon: Virl Grimes, MD;  Location: [facility] OR;  Service: Orthopedics;  Laterality: Right;    Prior to Admission medications   Medication Sig Start Date End Date Taking? Authorizing Provider  albuterol  (VENTOLIN  HFA) 108 (90 Base) MCG/ACT inhaler Inhale 1-2 puffs into the lungs every 6 (six) hours as needed for wheezing or shortness of breath. 10/22/21  Yes McLean-Scocuzza, Karon Packer, MD  Calcium -Magnesium-Vitamin D  (CALCIUM  1200+D3 PO) Take 2 tablets by mouth daily. 03/22/22   Yes [provider]  gabapentin  (NEURONTIN ) 300 MG capsule TAKE 2 CAPSULES(600 MG) BY MOUTH TWICE DAILY 06/30/23  Yes Walsh, Tanya, MD  Multiple Vitamins-Minerals (MULTIVITAMIN WITH MINERALS) tablet Take 1 tablet by mouth daily.   Yes [provider]  omeprazole  (PRILOSEC) 40 MG capsule Take 1 capsule (40 mg total) by mouth daily. 07/13/23  Yes Luke Salaam, MD  PREVIDENT  5000 SENSITIVE 1.1-5 % GEL Take 1 Application by mouth See admin instructions. 02/06/23  Yes [provider]  tiZANidine  (ZANAFLEX ) 4 MG tablet TAKE 1 TABLET(4 MG) BY MOUTH AT BEDTIME 06/22/23  Yes Valli Gaw, MD  verapamil  (CALAN -SR) 240 MG CR tablet TAKE 1 TABLET(240 MG) BY MOUTH AT BEDTIME 02/10/23  Yes Valli Gaw, MD    Allergies as of 08/04/2023 - Review Complete 07/27/2023  Allergen Reaction Noted   Latex Hives 07/29/2016   Sulfa antibiotics Hives 08/23/2013   Linaclotide Itching 11/21/2013    Family History  Problem Relation Age of Onset   Breast cancer Sister 28   Cancer Sister        breast    Diabetes Sister    Miscarriages / [place] Sister    Cancer Mother        breast/precancer changes   Heart disease Mother    Hypertension Mother    Miscarriages / Stillbirths Mother    Heart failure Mother    Multiple sclerosis Father    Miscarriages / [place] Daughter    Arthritis Maternal Grandmother    Hyperlipidemia Maternal Grandmother    Learning disabilities Maternal Grandmother    Cancer Maternal Grandfather    Diabetes Paternal Grandmother    Arthritis Sister    Miscarriages / [place] Sister    Other Sister        precancerous changes breast    Social History   Socioeconomic History   Marital status: Divorced    Spouse name: Not on file   Number of children: Not on file   Years of education: Not on file   Highest education level: Associate degree: academic program  Occupational History   Not on file  Tobacco Use   Smoking status: Former    Current  packs/day: 0.00    Types: Cigarettes    Start date: 1983    Quit date: 2013    Years since quitting: 12.4   Smokeless tobacco: Never  Vaping Use   Vaping status: Never Used  Substance and Sexual Activity   Alcohol use: Yes    Alcohol/week: 7.0 standard drinks of alcohol    Types: 7 Glasses of wine per week    Comment: per week   Drug use: No   Sexual activity: Not on file  Other Topics Concern   Not on file  Social History Narrative   ** Merged History  Encounter **       Associate degree  Wears seat belt, feels safe in marriage to Ron 1 daughter    Social Drivers of Health   Financial Resource Strain: Medium Risk (07/21/2023)   Overall Financial Resource Strain (CARDIA)    Difficulty of Paying Living Expenses: Somewhat hard  Food Insecurity: No Food Insecurity (07/21/2023)   Hunger Vital Sign    Worried About Running Out of Food in the Last Year: Never true    Ran Out of Food in the Last Year: Never true  Transportation Needs: No Transportation Needs (07/21/2023)   PRAPARE - Administrator, Civil Service (Medical): No    Lack of Transportation (Non-Medical): No  Physical Activity: Insufficiently Active (07/21/2023)   Exercise Vital Sign    Days of Exercise per Week: 3 days    Minutes of Exercise per Session: 30 min  Stress: Stress Concern Present (07/21/2023)   [employer] of Occupational Health - Occupational Stress Questionnaire    Feeling of Stress : Rather much  Social Connections: Moderately Integrated (07/21/2023)   Social Connection and Isolation Panel    Frequency of Communication with Friends and Family: More than three times a week    Frequency of Social Gatherings with Friends and Family: Once a week    Attends Religious Services: More than 4 times per year    Active Member of [employer] or Organizations: No    Attends Banker Meetings: Never    Marital Status: Living with partner  Intimate Partner Violence: Not At Risk (11/25/2022)    Humiliation, Afraid, Rape, and Kick questionnaire    Fear of Current or Ex-Partner: No    Emotionally Abused: No    Physically Abused: No    Sexually Abused: No    Review of Systems: See HPI, otherwise negative ROS  Physical Exam: BP (!) 155/81   Pulse 82   Temp (!) 97.3 F (36.3 C) (Temporal)   Resp 20   Ht 5' 4 (1.626 m)   Wt 52.2 kg   SpO2 100%   BMI 19.74 kg/m  General:   Alert,  pleasant and cooperative in NAD Head:  Normocephalic and atraumatic. Lungs:  Clear to auscultation.    Heart:  Regular rate and rhythm.   Impression/Plan: Amy Grant is here for ophthalmic surgery.  Risks, benefits, limitations, and alternatives regarding ophthalmic surgery have been reviewed with the patient.  Questions have been answered.  All parties agreeable.   Annell Kidney, MD  08/19/2023, 7:34 AM

## 2023-08-19 NOTE — Op Note (Signed)
 LOCATION:  [facility]   PREOPERATIVE DIAGNOSIS:    Nuclear sclerotic cataract right eye. H25.11   POSTOPERATIVE DIAGNOSIS:  Nuclear sclerotic cataract right eye.     PROCEDURE:  Phacoemusification with posterior chamber intraocular lens placement of the right eye   ULTRASOUND TIME: Procedure(s): PHACOEMULSIFICATION, CATARACT, WITH IOL INSERTION 6.04 00:48.3 (Right)  LENS:   Implant Name Type Inv. Item Serial No. Manufacturer Lot No. LRB No. Used Action  LENS IOL TECNIS EYHANCE 19.5 - [id number] Intraocular Lens LENS IOL TECNIS EYHANCE 19.5 [id number] [employer]  Right 1 Implanted         SURGEON:  Berline Brenner, MD   ANESTHESIA:  Topical with tetracaine drops and 2% Xylocaine  jelly, augmented with 1% preservative-free intracameral lidocaine .    COMPLICATIONS:  Posterior capsule tear without vitreous presentation   DESCRIPTION OF PROCEDURE:  The patient was identified in the holding room and transported to the operating room and placed in the supine position under the operating microscope.  The right eye was identified as the operative eye and it was prepped and draped in the usual sterile ophthalmic fashion.   A 1 millimeter clear-corneal paracentesis was made at the 12:00 position.  0.5 ml of preservative-free 1% lidocaine  was injected into the anterior chamber. The anterior chamber was filled with Viscoat viscoelastic.  A 2.4 millimeter keratome was used to make a near-clear corneal incision at the 9:00 position.  A curvilinear capsulorrhexis was made with a cystotome and capsulorrhexis forceps.  Balanced salt solution was used to hydrodissect and hydrodelineate the nucleus.   Phacoemulsification was then used in stop and chop fashion to remove the lens nucleus and epinucleus.  The remaining cortex was then removed using the irrigation and aspiration handpiece. Provisc was then placed into the capsular bag to distend it for lens placement.  A lens was then injected  into the capsular bag.  After insertion, a small peripheral posterior capsule tear was noted at the 12:00 to 2:00 position.  No vitreous was presenting. The remaining viscoelastic was aspirated. No vitreous was noted in the anterior chamber or around the edge of the intraocular lens.   Wounds were hydrated with balanced salt solution.  The anterior chamber was inflated to a physiologic pressure with balanced salt solution.  No wound leaks were noted. Cefuroxime 0.1 ml of a 10mg /ml solution was injected into the anterior chamber for a dose of 1 mg of intracameral antibiotic at the completion of the case.   Timolol and Brimonidine drops were applied to the eye.  The patient was taken to the recovery room in stable condition without complications of anesthesia or surgery.   Li Fragoso 08/19/2023, 8:21 AM

## 2023-08-19 NOTE — Anesthesia Postprocedure Evaluation (Signed)
 Anesthesia Post Note  Patient: Amy Grant  Procedure(s) Performed: PHACOEMULSIFICATION, CATARACT, WITH IOL INSERTION 6.04 00:48.3 (Right: Eye)  Patient location during evaluation: PACU Anesthesia Type: MAC Level of consciousness: awake and alert Pain management: pain level controlled Vital Signs Assessment: post-procedure vital signs reviewed and stable Respiratory status: spontaneous breathing, nonlabored ventilation and respiratory function stable Cardiovascular status: stable and blood pressure returned to baseline Postop Assessment: no apparent nausea or vomiting Anesthetic complications: no   No notable events documented.   Last Vitals:  Vitals:   08/19/23 0831 08/19/23 0832  BP:    Pulse: 62 61  Resp: 11 13  Temp:    SpO2: 100% 99%    Last Pain:  Vitals:   08/19/23 0830  TempSrc:   PainSc: 0-No pain                 Baltazar Bonier

## 2023-08-20 ENCOUNTER — Encounter: Payer: Self-pay | Admitting: Ophthalmology

## 2023-08-20 DIAGNOSIS — H2512 Age-related nuclear cataract, left eye: Secondary | ICD-10-CM | POA: Diagnosis not present

## 2023-08-26 ENCOUNTER — Encounter: Payer: Self-pay | Admitting: Nurse Practitioner

## 2023-08-26 ENCOUNTER — Ambulatory Visit: Attending: Nurse Practitioner | Admitting: Nurse Practitioner

## 2023-08-26 VITALS — BP 116/75 | HR 86 | Temp 96.2°F | Ht 65.0 in | Wt 115.0 lb

## 2023-08-26 DIAGNOSIS — M961 Postlaminectomy syndrome, not elsewhere classified: Secondary | ICD-10-CM | POA: Diagnosis not present

## 2023-08-26 DIAGNOSIS — G8929 Other chronic pain: Secondary | ICD-10-CM | POA: Insufficient documentation

## 2023-08-26 DIAGNOSIS — Z0289 Encounter for other administrative examinations: Secondary | ICD-10-CM | POA: Diagnosis not present

## 2023-08-26 DIAGNOSIS — G894 Chronic pain syndrome: Secondary | ICD-10-CM | POA: Diagnosis not present

## 2023-08-26 DIAGNOSIS — M5412 Radiculopathy, cervical region: Secondary | ICD-10-CM | POA: Insufficient documentation

## 2023-08-26 DIAGNOSIS — M545 Low back pain, unspecified: Secondary | ICD-10-CM | POA: Insufficient documentation

## 2023-08-26 DIAGNOSIS — M5416 Radiculopathy, lumbar region: Secondary | ICD-10-CM | POA: Insufficient documentation

## 2023-08-26 DIAGNOSIS — M48062 Spinal stenosis, lumbar region with neurogenic claudication: Secondary | ICD-10-CM | POA: Insufficient documentation

## 2023-08-26 MED ORDER — HYDROCODONE-ACETAMINOPHEN 7.5-325 MG PO TABS: 1.0000 | ORAL_TABLET | Freq: Four times a day (QID) | ORAL | 0 refills | Status: DC | PRN

## 2023-08-26 MED ORDER — HYDROCODONE-ACETAMINOPHEN 7.5-325 MG PO TABS: 1.0000 | ORAL_TABLET | Freq: Four times a day (QID) | ORAL | 0 refills | Status: AC | PRN

## 2023-08-26 NOTE — Progress Notes (Signed)
 Safety precautions to be maintained throughout the outpatient stay will include: orient to surroundings, keep bed in low position, maintain call bell within reach at all times, provide assistance with transfer out of bed and ambulation.   Nursing Pain Medication Assessment:  Safety precautions to be maintained throughout the outpatient stay will include: orient to surroundings, keep bed in low position, maintain call bell within reach at all times, provide assistance with transfer out of bed and ambulation.  Medication Inspection Compliance: Pill count conducted under aseptic conditions, in front of the patient. Neither the pills nor the bottle was removed from the patient's sight at any time. Once count was completed pills were immediately returned to the patient in their original bottle.  Medication: Hydrocodone /APAP Pill/Patch Count: 76 of 90 pills/patches remain Pill/Patch Appearance: Markings consistent with prescribed medication Bottle Appearance: Standard pharmacy container. Clearly labeled. Filled Date: 5 / 2 / 2025 Last Medication intake:  Yesterday

## 2023-08-26 NOTE — Progress Notes (Signed)
 PROVIDER NOTE: Interpretation of information contained herein should be left to medically-trained personnel. Specific patient instructions are provided elsewhere under Patient Instructions section of medical record. This document was created in part using AI and STT-dictation technology, any transcriptional errors that may result from this process are unintentional.  Patient: Amy Grant  Service: E/M   PCP: Hope Merle, MD  DOB: 1950-10-09  DOS: 08/26/2023  Provider: Emmy MARLA Blanch, NP  MRN: [id number]  Delivery: Face-to-face  Specialty: Interventional Pain Management  Type: Established Patient  Setting: Ambulatory outpatient facility  Specialty designation: 09  Referring Prov.: Hope Merle, MD  Location: Outpatient office facility       History of present illness (HPI) Amy Grant, a 73 y.o. year old female, is here today because of her Chronic pain syndrome [G89.4]. Amy Grant primary complain today is Back Pain (Upper back between shoulders and lower back)   Pain Assessment: Severity of Chronic pain is reported as a 2 /10. Location: Back Upper, Lower/radiates to right hip and mid back and shoulder area. Onset: More than a month ago. Quality: Dull, Aching. Timing: Constant (kind of constant). Modifying factor(s): heat, meds. Vitals:  height is 5' 5 (1.651 m) and weight is 115 lb (52.2 kg). Her temperature is 96.2 F (35.7 C) (abnormal). Her blood pressure is 116/75 and her pulse is 86. Her oxygen saturation is 100%.  BMI: Estimated body mass index is 19.14 kg/m as calculated from the following:   Height as of this encounter: 5' 5 (1.651 m).   Weight as of this encounter: 115 lb (52.2 kg).  Last encounter: 03/31/2023 Last procedure: Visit date not found.  Reason for encounter: medication management. No change in medical history since last visit.  Patient's pain is at baseline.  Patient continues multimodal pain regimen as prescribed.  States that it provides pain relief and  improvement in functional status.   The patient experience chronic upper back pain between the shoulders and in the lower back area, which is exacerbated by certain movements.  She manages her pain with her current medication regimen, which helps relieve the pain and improve mobility.  She also complained about right-sided neck pain today which started about a month ago.  I advised her to monitor her symptoms, and if the pain persistent, we can proceed with further evaluation through a cervical MRI or x-ray.  Pharmacotherapy Assessment   Analgesic: Hydrocodone -acetaminophen  (Norco) 7.5-325 mg tablet every 6 hours as needed for pain. MME=22.50 Monitoring: [place] PMP: PDMP reviewed during this encounter.       Pharmacotherapy: No side-effects or adverse reactions reported. Compliance: No problems identified. Effectiveness: Clinically acceptable.  [name], RN  08/26/2023  2:10 PM  Sign when Signing Visit Safety precautions to be maintained throughout the outpatient stay will include: orient to surroundings, keep bed in low position, maintain call bell within reach at all times, provide assistance with transfer out of bed and ambulation.   Nursing Pain Medication Assessment:  Safety precautions to be maintained throughout the outpatient stay will include: orient to surroundings, keep bed in low position, maintain call bell within reach at all times, provide assistance with transfer out of bed and ambulation.  Medication Inspection Compliance: Pill count conducted under aseptic conditions, in front of the patient. Neither the pills nor the bottle was removed from the patient's sight at any time. Once count was completed pills were immediately returned to the patient in their original bottle.  Medication: Hydrocodone /APAP Pill/Patch Count: 76 of  90 pills/patches remain Pill/Patch Appearance: Markings consistent with prescribed medication Bottle Appearance: Standard pharmacy container. Clearly  labeled. Filled Date: 5 / 2 / 2025 Last Medication intake:  Yesterday  UDS:  Summary  Date Value Ref Range Status  09/09/2022 Note  Final    Comment:    ==================================================================== [employer] Select 13 (MW) ==================================================================== Test                             Result       Flag       Units  Drug Present and Declared for Prescription Verification   Hydrocodone                     997          EXPECTED   ng/mg creat   Hydromorphone                   324          EXPECTED   ng/mg creat   Norhydrocodone                 934          EXPECTED   ng/mg creat    Sources of hydrocodone  include scheduled prescription medications.    Hydromorphone  and norhydrocodone are expected metabolites of    hydrocodone . Hydromorphone  is also available as a scheduled    prescription medication.  Drug Present not Declared for Prescription Verification   Carboxy-THC                    34           UNEXPECTED ng/mg creat    Carboxy-THC is a metabolite of tetrahydrocannabinol (THC). Source of    THC is most commonly herbal marijuana or marijuana-based products,    but THC is also present in a scheduled prescription medication.    Trace amounts of THC can be present in hemp and cannabidiol (CBD)    products. This test is not intended to distinguish between delta-9-    tetrahydrocannabinol, the predominant form of THC in most herbal or    marijuana-based products, and delta-8-tetrahydrocannabinol.  ==================================================================== Test                      Result    Flag   Units      Ref Range   Creatinine              62               mg/dL      >=79 ==================================================================== Declared Medications:  The flagging and interpretation on this report are based on the  following declared medications.  Unexpected results may arise from  inaccuracies  in the declared medications.   **Note: The testing scope of this panel includes these medications:   Hydrocodone  (Norco)   **Note: The testing scope of this panel does not include the  following reported medications:   Acetaminophen  (Norco)  Albuterol  (Ventolin  HFA)  Calcium   Fluoride  (Prevident )  Gabapentin  (Neurontin )  Magnesium  Nystatin  (Mycostatin )  Tizanidine  (Zanaflex )  Verapamil  (Calan )  Vitamin D  ==================================================================== For clinical consultation, please call [phone]. ====================================================================     No results found for: CBDTHCR No results found for: D8THCCBX No results found for: D9THCCBX  ROS  Constitutional: Denies any fever or chills Gastrointestinal: No reported hemesis, hematochezia, vomiting, or acute GI distress  Musculoskeletal: Upper back pain between shoulder and lower back area, right neck pain Neurological: No reported episodes of acute onset apraxia, aphasia, dysarthria, agnosia, amnesia, paralysis, loss of coordination, or loss of consciousness  Medication Review  Calcium -Magnesium-Vitamin D , HYDROcodone -acetaminophen , Sod Fluoride -Potassium Nitrate , albuterol , gabapentin , multivitamin with minerals, omeprazole , tiZANidine , and verapamil   History Review  Allergy: Amy Grant is allergic to latex, sulfa antibiotics, and linaclotide. Drug: Amy Grant  reports no history of drug use. Alcohol:  reports current alcohol use of about 7.0 standard drinks of alcohol per week. Tobacco:  reports that she quit smoking about 12 years ago. Her smoking use included cigarettes. She started smoking about 42 years ago. She has never used smokeless tobacco. Social: Amy Grant  reports that she quit smoking about 12 years ago. Her smoking use included cigarettes. She started smoking about 42 years ago. She has never used smokeless tobacco. She reports current alcohol use of  about 7.0 standard drinks of alcohol per week. She reports that she does not use drugs. Medical:  has a past medical history of Anxiety, Arthritis, Asthma, Blood in stool, Cervical radicular pain (11/16/2019), Chronic intermittent post-traumatic headache, Chronic neck pain with history of cervical spinal surgery (03/28/2017), Chronic radicular lumbar pain (08/07/2016), Colon polyps, Complication of anesthesia (2018), Depression, Failed back surgical syndrome (12/17/2021), Family history of adverse reaction to anesthesia, GERD (gastroesophageal reflux disease), Head injury, Headache, Hepatitis, Hepatitis C, History of blood transfusion, History of hepatitis C (03/27/2020), History of lumbar surgery (11/21/2019), Hypertension, Laryngopharyngeal reflux (LPR), Multiple falls, Oral lichen planus, Osteoarthritis (03/28/2017), Pancreatitis, Seizures (HCC), and Vitamin D  deficiency. Surgical: Amy Grant  has a past surgical history that includes Hernia repair; Cesarean section; Exploratory laparotomy; Tonsillectomy; Appendectomy; Colon resection; Colonoscopy; Anterior cervical decomp/discectomy fusion (N/A, 08/07/2016); Breast biopsy (Left); Abdominal hysterectomy (1979); acdf; Transforaminal lumbar interbody fusion (tlif) with pedicle screw fixation 1 level (Right, 03/24/2018); low back surgery; Colonoscopy with propofol  (N/A, 01/15/2023); Esophagogastroduodenoscopy (egd) with propofol  (N/A, 01/15/2023); biopsy (01/15/2023); polypectomy (01/15/2023); and Cataract extraction w/PHACO (Right, 08/19/2023). Family: family history includes Arthritis in her maternal grandmother and sister; Breast cancer (age of onset: 69) in her sister; Cancer in her maternal grandfather, mother, and sister; Diabetes in her paternal grandmother and sister; Heart disease in her mother; Heart failure in her mother; Hyperlipidemia in her maternal grandmother; Hypertension in her mother; Learning disabilities in her maternal grandmother; Miscarriages /  [place] in her daughter, mother, sister, and sister; Multiple sclerosis in her father; Other in her sister.  Laboratory Chemistry Profile   Renal Lab Results  Component Value Date   BUN 10 03/19/2023   CREATININE 1.02 (H) 03/19/2023   BCR 10 (L) 03/19/2023   GFR 54.67 (L) 07/14/2022   GFRAA >60 03/18/2018   GFRNONAA >60 03/18/2018    Hepatic Lab Results  Component Value Date   AST 27 03/19/2023   ALT 16 03/19/2023   ALBUMIN 5.0 (H) 03/19/2023   ALKPHOS 70 03/19/2023   LIPASE 57 03/19/2023    Electrolytes Lab Results  Component Value Date   NA 142 03/19/2023   K 4.2 03/19/2023   CL 102 03/19/2023   CALCIUM  9.6 03/19/2023    Bone Lab Results  Component Value Date   VD25OH 119.53 (HH) 07/14/2022    Inflammation (CRP: Acute Phase) (ESR: Chronic Phase) No results found for: CRP, ESRSEDRATE, LATICACIDVEN       Note: Above Lab results reviewed.  Recent Imaging Review  MR LIVER W WO CONTRAST CLINICAL DATA:  Characterize liver lesion  EXAM: MRI  ABDOMEN WITHOUT AND WITH CONTRAST  TECHNIQUE: Multiplanar multisequence MR imaging of the abdomen was performed both before and after the administration of intravenous contrast.  CONTRAST:  5 mL Eovist  gadolinium contrast IV  COMPARISON:  CT abdomen pelvis, 01/28/2023  FINDINGS: Lower chest: No acute abnormality.  Hepatobiliary: Small arterially hyperenhancing lesion of the anterior right lobe of the liver hepatic segment VIII, measuring 0.6 x 0.6 cm (series 6, image 19). No appreciable underlying signal or diffusion abnormality, and this lesion is isoenhancing on all remaining contrast phases, including 20 minute delayed hepatobiliary phase. No gallstones, gallbladder wall thickening, or biliary dilatation.  Pancreas: Unremarkable. No pancreatic ductal dilatation or surrounding inflammatory changes.  Spleen: Normal in size without significant abnormality.  Adrenals/Urinary Tract: Adrenal glands are  unremarkable. Kidneys are normal, without renal calculi, solid lesion, or hydronephrosis.  Stomach/Bowel: Stomach is within normal limits. No evidence of bowel wall thickening, distention, or inflammatory changes.  Vascular/Lymphatic: Aortic atherosclerosis. No enlarged abdominal lymph nodes.  Other: No abdominal wall hernia or abnormality. No ascites.  Musculoskeletal: No acute or significant osseous findings.  IMPRESSION: 1. Small arterially hyperenhancing lesion of the anterior right lobe of the liver hepatic segment VIII, measuring 0.6 x 0.6 cm. No appreciable underlying signal or diffusion abnormality, and this lesion is isoenhancing on all remaining contrast phases, including 20 minute delayed hepatobiliary phase. This is most consistent with a small benign focal nodular hyperplasia. In the absence of known malignancy or chronic high risk liver disease (cirrhosis, chronic viral hepatitis), no further follow-up or characterization is required.  2.  No acute findings in the abdomen.  Aortic Atherosclerosis (ICD10-I70.0).  Electronically Signed   By: Marolyn JONETTA Jaksch M.D.   On: 03/31/2023 16:35 Note: Reviewed        Physical Exam  General appearance: Well nourished, well developed, and well hydrated. In no apparent acute distress Mental status: Alert, oriented x 3 (person, place, & time)       Respiratory: No evidence of acute respiratory distress Eyes: PERLA Vitals: BP 116/75   Pulse 86   Temp (!) 96.2 F (35.7 C)   Ht 5' 5 (1.651 m)   Wt 115 lb (52.2 kg)   SpO2 100%   BMI 19.14 kg/m  BMI: Estimated body mass index is 19.14 kg/m as calculated from the following:   Height as of this encounter: 5' 5 (1.651 m).   Weight as of this encounter: 115 lb (52.2 kg). Ideal: Ideal body weight: 57 kg (125 lb 10.6 oz)  Assessment   Diagnosis Status  1. Chronic pain syndrome   2. Chronic radicular lumbar pain   3. Spinal stenosis, lumbar region, with neurogenic  claudication   4. Post laminectomy syndrome   5. Failed back surgical syndrome   6. Pain management contract signed   7. Chronic low back pain, unspecified back pain laterality, unspecified whether sciatica present   8. Cervical radicular pain   9. Lumbar radiculopathy    Controlled Controlled Controlled   Updated Problems: No problems updated.  Plan of Care  Problem-specific:  Assessment and Plan We will continue on current medication regimen.  Prescribing drug monitoring (PDMP) reviewed; findings consistent with the use of prescribed medication and no evidence of narcotic misuse or abuse. Routine UDS ordered today.  Schedule follow-up in 90 days for medication management.  Amy Grant has a current medication list which includes the following long-term medication(s): albuterol , calcium -magnesium-vitamin d , gabapentin , omeprazole , and verapamil .  Pharmacotherapy (Medications Ordered): Meds ordered this encounter  Medications   HYDROcodone -acetaminophen  (NORCO) 7.5-325 MG tablet    Sig: Take 1 tablet by mouth every 6 (six) hours as needed for moderate pain (pain score 4-6). Must last 30 days    Dispense:  120 tablet    Refill:  0   HYDROcodone -acetaminophen  (NORCO) 7.5-325 MG tablet    Sig: Take 1 tablet by mouth every 6 (six) hours as needed for moderate pain (pain score 4-6). Must last 30 days    Dispense:  120 tablet    Refill:  0   HYDROcodone -acetaminophen  (NORCO) 7.5-325 MG tablet    Sig: Take 1 tablet by mouth every 6 (six) hours as needed for moderate pain (pain score 4-6). Must last 30 days    Dispense:  120 tablet    Refill:  0   Orders:  Orders Placed This Encounter  Procedures   Tox[employer] Select 13 (MW), Urine    Volume: 30 ml(s). Minimum 3 ml of urine is needed. Document temperature of fresh sample. Indications: Long term (current) use of opiate analgesic (S20.108)    Release to patient:   Immediate        Return in about 3 months (around  11/26/2023) for (F2F), (MM), Emmy Blanch NP.    Recent Visits No visits were found meeting these conditions. Showing recent visits within past 90 days and meeting all other requirements Today's Visits Date Type Provider Dept  08/26/23 Office Visit Sharin Altidor K, NP [facility]  Showing today's visits and meeting all other requirements Future Appointments Date Type Provider Dept  11/19/23 Appointment Shealee Yordy K, NP [facility]  Showing future appointments within next 90 days and meeting all other requirements  I discussed the assessment and treatment plan with the patient. The patient was provided an opportunity to ask questions and all were answered. The patient agreed with the plan and demonstrated an understanding of the instructions.  Patient advised to call back or seek an in-person evaluation if the symptoms or condition worsens.  Duration of encounter: 30 minutes.  Total time on encounter, as per AMA guidelines included both the face-to-face and non-face-to-face time personally spent by the physician and/or other qualified health care professional(s) on the day of the encounter (includes time in activities that require the physician or other qualified health care professional and does not include time in activities normally performed by clinical staff). Physician's time may include the following activities when performed: Preparing to see the patient (e.g., pre-charting review of records, searching for previously ordered imaging, lab work, and nerve conduction tests) Review of prior analgesic pharmacotherapies. Reviewing PMP Interpreting ordered tests (e.g., lab work, imaging, nerve conduction tests) Performing post-procedure evaluations, including interpretation of diagnostic procedures Obtaining and/or reviewing separately obtained history Performing a medically appropriate examination and/or evaluation Counseling and educating the  patient/family/caregiver Ordering medications, tests, or procedures Referring and communicating with other health care professionals (when not separately reported) Documenting clinical information in the electronic or other health record Independently interpreting results (not separately reported) and communicating results to the patient/ family/caregiver Care coordination (not separately reported)  Note by: Rahma Meller K Vuong Musa, NP (TTS and AI technology used. I apologize for any typographical errors that were not detected and corrected.) Date: 08/26/2023; Time: 2:59 PM

## 2023-08-29 LAB — TOXASSURE SELECT 13 (MW), URINE

## 2023-08-31 NOTE — Discharge Instructions (Signed)

## 2023-09-02 ENCOUNTER — Encounter: Payer: Self-pay | Admitting: Ophthalmology

## 2023-09-02 ENCOUNTER — Ambulatory Visit: Payer: Self-pay | Admitting: Anesthesiology

## 2023-09-02 ENCOUNTER — Ambulatory Visit
Admission: RE | Admit: 2023-09-02 | Discharge: 2023-09-02 | Disposition: A | Discharge status: Home / Self Care | Attending: Ophthalmology | Admitting: Ophthalmology

## 2023-09-02 ENCOUNTER — Encounter
Admission: RE | Disposition: A | Discharge status: Home / Self Care | Payer: Self-pay | Source: Home / Self Care | Attending: Ophthalmology

## 2023-09-02 ENCOUNTER — Other Ambulatory Visit: Payer: Self-pay

## 2023-09-02 DIAGNOSIS — E1136 Type 2 diabetes mellitus with diabetic cataract: Secondary | ICD-10-CM | POA: Diagnosis not present

## 2023-09-02 DIAGNOSIS — Z87891 Personal history of nicotine dependence: Secondary | ICD-10-CM | POA: Diagnosis not present

## 2023-09-02 DIAGNOSIS — K219 Gastro-esophageal reflux disease without esophagitis: Secondary | ICD-10-CM | POA: Insufficient documentation

## 2023-09-02 DIAGNOSIS — H2512 Age-related nuclear cataract, left eye: Secondary | ICD-10-CM | POA: Insufficient documentation

## 2023-09-02 DIAGNOSIS — J45909 Unspecified asthma, uncomplicated: Secondary | ICD-10-CM | POA: Diagnosis not present

## 2023-09-02 DIAGNOSIS — B1920 Unspecified viral hepatitis C without hepatic coma: Secondary | ICD-10-CM | POA: Insufficient documentation

## 2023-09-02 DIAGNOSIS — I10 Essential (primary) hypertension: Secondary | ICD-10-CM | POA: Diagnosis not present

## 2023-09-02 DIAGNOSIS — H25012 Cortical age-related cataract, left eye: Secondary | ICD-10-CM | POA: Diagnosis present

## 2023-09-02 HISTORY — PX: CATARACT EXTRACTION W/PHACO: SHX586

## 2023-09-02 SURGERY — PHACOEMULSIFICATION, CATARACT, WITH IOL INSERTION
Anesthesia: Monitor Anesthesia Care | Site: Eye | Laterality: Left

## 2023-09-02 MED ORDER — LACTATED RINGERS IV SOLN: INTRAVENOUS | Status: DC

## 2023-09-02 MED ORDER — ACETAMINOPHEN 10 MG/ML IV SOLN: 1000.0000 mg | Freq: Once | INTRAVENOUS | Status: DC | PRN

## 2023-09-02 MED ORDER — ARMC OPHTHALMIC DILATING DROPS
OPHTHALMIC | Status: AC
  Filled 2023-09-02: qty 0.5

## 2023-09-02 MED ORDER — MIDAZOLAM HCL 2 MG/2ML IJ SOLN
INTRAMUSCULAR | Status: AC
  Filled 2023-09-02: qty 2

## 2023-09-02 MED ORDER — SIGHTPATH DOSE#1 BSS IO SOLN
INTRAOCULAR | Status: DC | PRN
  Administered 2023-09-02: 15 mL via INTRAOCULAR

## 2023-09-02 MED ORDER — FENTANYL CITRATE (PF) 100 MCG/2ML IJ SOLN
INTRAMUSCULAR | Status: AC
  Filled 2023-09-02: qty 2

## 2023-09-02 MED ORDER — ARMC OPHTHALMIC DILATING DROPS
1.0000 | OPHTHALMIC | Status: DC | PRN
  Administered 2023-09-02 (×3): 1 via OPHTHALMIC

## 2023-09-02 MED ORDER — FENTANYL CITRATE PF 50 MCG/ML IJ SOSY: 25.0000 ug | PREFILLED_SYRINGE | INTRAMUSCULAR | Status: DC | PRN

## 2023-09-02 MED ORDER — OXYCODONE HCL 5 MG/5ML PO SOLN: 5.0000 mg | Freq: Once | ORAL | Status: DC | PRN

## 2023-09-02 MED ORDER — TETRACAINE HCL 0.5 % OP SOLN
1.0000 [drp] | OPHTHALMIC | Status: DC | PRN
  Administered 2023-09-02 (×3): 1 [drp] via OPHTHALMIC

## 2023-09-02 MED ORDER — OXYCODONE HCL 5 MG PO TABS: 5.0000 mg | ORAL_TABLET | Freq: Once | ORAL | Status: DC | PRN

## 2023-09-02 MED ORDER — DROPERIDOL 2.5 MG/ML IJ SOLN: 0.6250 mg | Freq: Once | INTRAMUSCULAR | Status: DC | PRN

## 2023-09-02 MED ORDER — MIDAZOLAM HCL 2 MG/2ML IJ SOLN
INTRAMUSCULAR | Status: DC | PRN
  Administered 2023-09-02 (×3): 1 mg via INTRAVENOUS

## 2023-09-02 MED ORDER — CEFUROXIME OPHTHALMIC INJECTION 1 MG/0.1 ML
INJECTION | OPHTHALMIC | Status: DC | PRN
  Administered 2023-09-02: 1 mg via INTRACAMERAL

## 2023-09-02 MED ORDER — LIDOCAINE HCL (PF) 2 % IJ SOLN
INTRAOCULAR | Status: DC | PRN
  Administered 2023-09-02: 2 mL

## 2023-09-02 MED ORDER — BRIMONIDINE TARTRATE-TIMOLOL 0.2-0.5 % OP SOLN
OPHTHALMIC | Status: DC | PRN
  Administered 2023-09-02: 1 [drp] via OPHTHALMIC

## 2023-09-02 MED ORDER — SIGHTPATH DOSE#1 NA HYALUR & NA CHOND-NA HYALUR IO KIT
PACK | INTRAOCULAR | Status: DC | PRN
  Administered 2023-09-02: 1 via OPHTHALMIC

## 2023-09-02 MED ORDER — TETRACAINE HCL 0.5 % OP SOLN
OPHTHALMIC | Status: AC
  Filled 2023-09-02: qty 4

## 2023-09-02 MED ORDER — EPINEPHRINE PF 1 MG/ML IJ SOLN
INTRAMUSCULAR | Status: DC | PRN
  Administered 2023-09-02: 58 mL via OPHTHALMIC

## 2023-09-02 MED ORDER — FENTANYL CITRATE (PF) 100 MCG/2ML IJ SOLN
INTRAMUSCULAR | Status: DC | PRN
  Administered 2023-09-02 (×2): 50 ug via INTRAVENOUS

## 2023-09-02 SURGICAL SUPPLY — 10 items

## 2023-09-02 NOTE — Transfer of Care (Signed)
 Immediate Anesthesia Transfer of Care Note  Patient: Amy Grant  Procedure(s) Performed: PHACOEMULSIFICATION, CATARACT, WITH IOL INSERTION 5.28 00:32.6 (Left: Eye)  Patient Location: PACU  Anesthesia Type: MAC  Level of Consciousness: awake, alert  and patient cooperative  Airway and Oxygen Therapy: Patient Spontanous Breathing and Patient connected to supplemental oxygen  Post-op Assessment: Post-op Vital signs reviewed, Patient's Cardiovascular Status Stable, Respiratory Function Stable, Patent Airway and No signs of Nausea or vomiting  Post-op Vital Signs: Reviewed and stable  Complications: No notable events documented.

## 2023-09-02 NOTE — Anesthesia Postprocedure Evaluation (Signed)
 Anesthesia Post Note  Patient: Amy Grant  Procedure(s) Performed: PHACOEMULSIFICATION, CATARACT, WITH IOL INSERTION 5.28 00:32.6 (Left: Eye)  Patient location during evaluation: PACU Anesthesia Type: MAC Level of consciousness: awake and alert Pain management: pain level controlled Vital Signs Assessment: post-procedure vital signs reviewed and stable Respiratory status: spontaneous breathing, nonlabored ventilation, respiratory function stable and patient connected to nasal cannula oxygen Cardiovascular status: stable and blood pressure returned to baseline Postop Assessment: no apparent nausea or vomiting Anesthetic complications: no   No notable events documented.   Last Vitals:  Vitals:   09/02/23 1004 09/02/23 1011  BP: 121/66   Pulse: 61   Resp: 15   Temp: (!) 36.4 C 36.7 C  SpO2: 99%     Last Pain:  Vitals:   09/02/23 1011  TempSrc:   PainSc: 0-No pain                 Lynwood KANDICE Clause

## 2023-09-02 NOTE — Op Note (Signed)
 OPERATIVE NOTE  Amy Grant [id number] 09/02/2023   PREOPERATIVE DIAGNOSIS:  Nuclear sclerotic cataract left eye. H25.12   POSTOPERATIVE DIAGNOSIS:    Nuclear sclerotic cataract left eye.     PROCEDURE:  Phacoemusification with posterior chamber intraocular lens placement of the left eye  Ultrasound time: Procedure(s): PHACOEMULSIFICATION, CATARACT, WITH IOL INSERTION 5.28 00:32.6 (Left)  LENS:   Implant Name Type Inv. Item Serial No. Manufacturer Lot No. LRB No. Used Action  LENS IOL TECNIS EYHANCE 20.0 - [id number] Intraocular Lens LENS IOL TECNIS EYHANCE 20.0 [id number] [employer]  Left 1 Implanted      SURGEON:  Dene FABIENE Etienne, MD   ANESTHESIA:  Topical with tetracaine  drops and 2% Xylocaine  jelly, augmented with 1% preservative-free intracameral lidocaine .    COMPLICATIONS:  None.   DESCRIPTION OF PROCEDURE:  The patient was identified in the holding room and transported to the operating room and placed in the supine position under the operating microscope.  The left eye was identified as the operative eye and it was prepped and draped in the usual sterile ophthalmic fashion.   A 1 millimeter clear-corneal paracentesis was made at the 1:30 position.  0.5 ml of preservative-free 1% lidocaine  was injected into the anterior chamber.  The anterior chamber was filled with Viscoat viscoelastic.  A 2.4 millimeter keratome was used to make a near-clear corneal incision at the 10:30 position.  .  A curvilinear capsulorrhexis was made with a cystotome and capsulorrhexis forceps.  Balanced salt  solution was used to hydrodissect and hydrodelineate the nucleus.   Phacoemulsification was then used in stop and chop fashion to remove the lens nucleus and epinucleus.  The remaining cortex was then removed using the irrigation and aspiration handpiece. Provisc was then placed into the capsular bag to distend it for lens placement.  A lens was then injected into the capsular bag.  The  remaining viscoelastic was aspirated.   Wounds were hydrated with balanced salt  solution.  The anterior chamber was inflated to a physiologic pressure with balanced salt  solution.  No wound leaks were noted. Cefuroxime  0.1 ml of a 10mg /ml solution was injected into the anterior chamber for a dose of 1 mg of intracameral antibiotic at the completion of the case.   Timolol  and Brimonidine  drops were applied to the eye.  The patient was taken to the recovery room in stable condition without complications of anesthesia or surgery.  [name] 09/02/2023, 10:03 AM

## 2023-09-02 NOTE — H&P (Signed)
 [facility]   Primary Care Physician:  Hope Merle, MD Ophthalmologist: Dr. Dene Etienne  Pre-Procedure History & Physical: HPI:  Amy Grant is a 73 y.o. female here for ophthalmic surgery.   Past Medical History:  Diagnosis Date   Anxiety    Arthritis    right hand, DDD L 4 , L 5   Asthma    Blood in stool    Cervical radicular pain 11/16/2019   Chronic intermittent post-traumatic headache    Chronic neck pain with history of cervical spinal surgery 03/28/2017   S/p surgery Dr. Beuford   Chronic radicular lumbar pain 08/07/2016   Reviewed MRI lumbar 04/2016 degenerative changes worse L5-S1 disc desiccation with ht loss, b/l facet effusion, synovial cysts, mild facet arthropathy, disc bulging and arthropathy at some areas marked also marked right neural foraminal narrowing with compression of exiting nerve rool L4/5, L5/S1 disc bulge and b/l foraminal extension of marked facet arthropathy>>>severe b/l neural foraminal stenos   Colon polyps    Complication of anesthesia 2018   difficulty waking up    Depression    Failed back surgical syndrome 12/17/2021   Family history of adverse reaction to anesthesia    Sister woke up during surgery   GERD (gastroesophageal reflux disease)    Head injury    Headache    h/o migraines    Hepatitis    Hepatitis C    treated with mavyret  per GI   History of blood transfusion    History of hepatitis C 03/27/2020   History of lumbar surgery 11/21/2019   Hypertension    Laryngopharyngeal reflux (LPR)    Multiple falls    Oral lichen planus    Osteoarthritis 03/28/2017   Pancreatitis    remote hx w/o h/o drinking at that time   Seizures (HCC)     after head injury   Vitamin D  deficiency     Past Surgical History:  Procedure Laterality Date   ABDOMINAL HYSTERECTOMY  1979   has an ovary left s/p hysterectomy 1979; h/o abnormal pap    acdf     08/07/16 Dr. Beuford    ANTERIOR CERVICAL DECOMP/DISCECTOMY FUSION N/A  08/07/2016   Procedure: ANTERIOR CERVICAL DECOMPRESSION FUSION, CERVICAL FOUR-FIVE  WITH INSTRUMENTATION AND ALLOGRAFT;  Surgeon: Beuford Anes, MD;  Location: [facility] OR;  Service: Orthopedics;  Laterality: N/A;   APPENDECTOMY     BIOPSY  01/15/2023   Procedure: BIOPSY;  Surgeon: Jinny Carmine, MD;  Location: [facility];  Service: Endoscopy;;   BREAST BIOPSY Left    over 20 years ago negative left breast as of 03/2020    CATARACT EXTRACTION W/PHACO Right 08/19/2023   Procedure: PHACOEMULSIFICATION, CATARACT, WITH IOL INSERTION 6.04 00:48.3;  Surgeon: Etienne Dene, MD;  Location: [facility];  Service: Ophthalmology;  Laterality: Right;   CESAREAN SECTION     COLON RESECTION     paritial ? reason done in [place]     COLONOSCOPY     COLONOSCOPY WITH PROPOFOL  N/A 01/15/2023   Procedure: COLONOSCOPY WITH PROPOFOL ;  Surgeon: Jinny Carmine, MD;  Location: [facility];  Service: Endoscopy;  Laterality: N/A;   ESOPHAGOGASTRODUODENOSCOPY (EGD) WITH PROPOFOL  N/A 01/15/2023   Procedure: ESOPHAGOGASTRODUODENOSCOPY (EGD) WITH PROPOFOL ;  Surgeon: Jinny Carmine, MD;  Location: [facility];  Service: Endoscopy;  Laterality: N/A;   EXPLORATORY LAPAROTOMY     after C Section  x 2 - 1 for bleeding and 1 for infection   HERNIA REPAIR     Inguinal - as  a child   low back surgery     Dr. Beuford    POLYPECTOMY  01/15/2023   Procedure: POLYPECTOMY;  Surgeon: Jinny Carmine, MD;  Location: [facility];  Service: Endoscopy;;   TONSILLECTOMY     TRANSFORAMINAL LUMBAR INTERBODY FUSION (TLIF) WITH PEDICLE SCREW FIXATION 1 LEVEL Right 03/24/2018   Procedure: RIGHT LUMBAR 4 - LUMBAR 5 TRANSFORAMINAL LUMBAR INTERBODY FUSION WITH INSTRUMENTATION AND ALLOGRAFT;  Surgeon: Beuford Anes, MD;  Location: [facility] OR;  Service: Orthopedics;  Laterality: Right;    Prior to Admission medications   Medication Sig Start Date End Date Taking? Authorizing Provider  albuterol  (VENTOLIN  HFA) 108 (90 Base) MCG/ACT  inhaler Inhale 1-2 puffs into the lungs every 6 (six) hours as needed for wheezing or shortness of breath. 10/22/21  Yes McLean-Scocuzza, Randine SAILOR, MD  Calcium -Magnesium-Vitamin D  (CALCIUM  1200+D3 PO) Take 2 tablets by mouth daily. 03/22/22  Yes [provider]  gabapentin  (NEURONTIN ) 300 MG capsule TAKE 2 CAPSULES(600 MG) BY MOUTH TWICE DAILY 06/30/23  Yes Hope Merle, MD  HYDROcodone -acetaminophen  (NORCO) 7.5-325 MG tablet Take 1 tablet by mouth every 6 (six) hours as needed for moderate pain (pain score 4-6). Must last 30 days 08/26/23 09/25/23 Yes Patel, Seema K, NP  Multiple Vitamins-Minerals (MULTIVITAMIN WITH MINERALS) tablet Take 1 tablet by mouth daily.   Yes [provider]  omeprazole  (PRILOSEC) 40 MG capsule Take 1 capsule (40 mg total) by mouth daily. 07/13/23  Yes Therisa Bi, MD  PREVIDENT  5000 SENSITIVE 1.1-5 % GEL Take 1 Application by mouth See admin instructions. 02/06/23  Yes [provider]  tiZANidine  (ZANAFLEX ) 4 MG tablet TAKE 1 TABLET(4 MG) BY MOUTH AT BEDTIME 06/22/23  Yes Hope Merle, MD  verapamil  (CALAN -SR) 240 MG CR tablet TAKE 1 TABLET(240 MG) BY MOUTH AT BEDTIME 02/10/23  Yes Hope Merle, MD  HYDROcodone -acetaminophen  (NORCO) 7.5-325 MG tablet Take 1 tablet by mouth every 6 (six) hours as needed for moderate pain (pain score 4-6). Must last 30 days 09/25/23 10/25/23  Patel, Seema K, NP  HYDROcodone -acetaminophen  (NORCO) 7.5-325 MG tablet Take 1 tablet by mouth every 6 (six) hours as needed for moderate pain (pain score 4-6). Must last 30 days 10/25/23 11/24/23  Patel, Seema K, NP    Allergies as of 08/04/2023 - Review Complete 07/27/2023  Allergen Reaction Noted   Latex Hives 07/29/2016   Sulfa antibiotics Hives 08/23/2013   Linaclotide Itching 11/21/2013    Family History  Problem Relation Age of Onset   Breast cancer Sister 63   Cancer Sister        breast    Diabetes Sister    Miscarriages / [place] Sister    Cancer Mother         breast/precancer changes   Heart disease Mother    Hypertension Mother    Miscarriages / Stillbirths Mother    Heart failure Mother    Multiple sclerosis Father    Miscarriages / [place] Daughter    Arthritis Maternal Grandmother    Hyperlipidemia Maternal Grandmother    Learning disabilities Maternal Grandmother    Cancer Maternal Grandfather    Diabetes Paternal Grandmother    Arthritis Sister    Miscarriages / [place] Sister    Other Sister        precancerous changes breast    Social History   Socioeconomic History   Marital status: Divorced    Spouse name: Not on file   Number of children: Not on file   Years of education: Not on  file   Highest education level: Associate degree: academic program  Occupational History   Not on file  Tobacco Use   Smoking status: Former    Current packs/day: 0.00    Types: Cigarettes    Start date: 75    Quit date: 2013    Years since quitting: 12.5   Smokeless tobacco: Never  Vaping Use   Vaping status: Never Used  Substance and Sexual Activity   Alcohol use: Yes    Alcohol/week: 7.0 standard drinks of alcohol    Types: 7 Glasses of wine per week    Comment: per week   Drug use: No   Sexual activity: Not on file  Other Topics Concern   Not on file  Social History Narrative   ** Merged History Encounter **       Associate degree  Wears seat belt, feels safe in marriage to Ron 1 daughter    Social Drivers of Corporate investment banker Strain: Medium Risk (07/21/2023)   Overall Financial Resource Strain (CARDIA)    Difficulty of Paying Living Expenses: Somewhat hard  Food Insecurity: No Food Insecurity (07/21/2023)   Hunger Vital Sign    Worried About Running Out of Food in the Last Year: Never true    Ran Out of Food in the Last Year: Never true  Transportation Needs: No Transportation Needs (07/21/2023)   PRAPARE - Administrator, Civil Service (Medical): No    Lack of Transportation  (Non-Medical): No  Physical Activity: Insufficiently Active (07/21/2023)   Exercise Vital Sign    Days of Exercise per Week: 3 days    Minutes of Exercise per Session: 30 min  Stress: Stress Concern Present (07/21/2023)   [employer] of Occupational Health - Occupational Stress Questionnaire    Feeling of Stress : Rather much  Social Connections: Moderately Integrated (07/21/2023)   Social Connection and Isolation Panel    Frequency of Communication with Friends and Family: More than three times a week    Frequency of Social Gatherings with Friends and Family: Once a week    Attends Religious Services: More than 4 times per year    Active Member of [employer] or Organizations: No    Attends Banker Meetings: Never    Marital Status: Living with partner  Intimate Partner Violence: Not At Risk (11/25/2022)   Humiliation, Afraid, Rape, and Kick questionnaire    Fear of Current or Ex-Partner: No    Emotionally Abused: No    Physically Abused: No    Sexually Abused: No    Review of Systems: See HPI, otherwise negative ROS  Physical Exam: There were no vitals taken for this visit. General:   Alert,  pleasant and cooperative in NAD Head:  Normocephalic and atraumatic. Lungs:  Clear to auscultation.    Heart:  Regular rate and rhythm.   Impression/Plan: Amy Grant is here for ophthalmic surgery.  Risks, benefits, limitations, and alternatives regarding ophthalmic surgery have been reviewed with the patient.  Questions have been answered.  All parties agreeable.   Amy GASKIN, MD  09/02/2023, 9:21 AM

## 2023-09-02 NOTE — Anesthesia Preprocedure Evaluation (Signed)
 Anesthesia Evaluation  Patient identified by MRN, date of birth, ID band Patient awake    Reviewed: Allergy & Precautions, H&P , NPO status , Patient's Chart, lab work & pertinent test results, reviewed documented beta blocker date and time   History of Anesthesia Complications (+) Family history of anesthesia reaction and history of anesthetic complications  Airway Mallampati: II  TM Distance: >3 FB Neck ROM: full    Dental no notable dental hx. (+) Teeth Intact   Pulmonary neg shortness of breath, asthma , former smoker   Pulmonary exam normal breath sounds clear to auscultation       Cardiovascular Exercise Tolerance: Good hypertension, On Medications negative cardio ROS  Rhythm:regular Rate:Normal     Neuro/Psych  Headaches, Seizures -,   Anxiety Depression     Neuromuscular disease  negative psych ROS   GI/Hepatic ,GERD  Medicated,,(+) Hepatitis -  Endo/Other  negative endocrine ROSdiabetes, Well Controlled    Renal/GU      Musculoskeletal   Abdominal   Peds  Hematology negative hematology ROS (+)   Anesthesia Other Findings   Reproductive/Obstetrics negative OB ROS                              Anesthesia Physical Anesthesia Plan  ASA: 3  Anesthesia Plan: MAC   Post-op Pain Management:    Induction:   PONV Risk Score and Plan: 2  Airway Management Planned:   Additional Equipment:   Intra-op Plan:   Post-operative Plan:   Informed Consent: I have reviewed the patients History and Physical, chart, labs and discussed the procedure including the risks, benefits and alternatives for the proposed anesthesia with the patient or authorized representative who has indicated his/her understanding and acceptance.       Plan Discussed with: CRNA  Anesthesia Plan Comments:         Anesthesia Quick Evaluation

## 2023-10-19 ENCOUNTER — Other Ambulatory Visit: Payer: Self-pay

## 2023-11-05 ENCOUNTER — Ambulatory Visit (INDEPENDENT_AMBULATORY_CARE_PROVIDER_SITE_OTHER)

## 2023-11-05 VITALS — BP 140/80 | HR 72 | Temp 98.4°F | Ht 64.0 in | Wt 114.0 lb

## 2023-11-05 DIAGNOSIS — R0789 Other chest pain: Secondary | ICD-10-CM | POA: Diagnosis not present

## 2023-11-05 MED ORDER — TIZANIDINE HCL 2 MG PO TABS: 2.0000 mg | ORAL_TABLET | Freq: Three times a day (TID) | ORAL | 0 refills | Status: DC | PRN

## 2023-11-05 MED ORDER — DICLOFENAC SODIUM 1 % EX GEL: 2.0000 g | Freq: Four times a day (QID) | CUTANEOUS | 1 refills | Status: DC

## 2023-11-05 NOTE — Patient Instructions (Addendum)
-   Apply diclofenac  gel 1% up to four times a day on the painful area.  - Take Tizanidine  2 mg every 8 hourly as needed to help with muscle relaxation.  - Avoid activities that triggers the pain.  - If you have chest pain, shortness of breath, you continue to have bloody bowel movement you should go to the ED.  - Increase fluid and fiber rich food to help with constipation.

## 2023-11-05 NOTE — Progress Notes (Signed)
 Acute Office Visit  Subjective:    Patient ID: Amy Grant, female    DOB: [birth year], 73 y.o.   MRN: [id number]  Chief Complaint  Patient presents with   Shoulder Pain   Breast Pain   Medication Refill   HPI Discussed the use of AI scribe software for clinical note transcription with the patient, who gave verbal consent to proceed.  History of Present Illness Amy Grant is a 73 year old female who presents with right sided arm pain, shoulder pain.  The pain is described as aching, dull, and constant.  The onset of the pain was 2 weeks ago.  The pain occurs continuously.    Symptoms are aggravated by lifting, over head movement, carrying, pushing on anterior chest. Symptoms are diminished by rest, heat.    Rates it 6 out of 10 in severity when she is active. She has been using a heating pad for temporary relief and takes hydrocodone  two to three times a day, which provides limited relief, along with tizanidine  as a muscle relaxant. She has stopped certain exercises and activities, such as lifting weights and vacuuming, to avoid aggravating the pain. No recent trauma or injury is reported. She experiences increased irritability due to the constant nature of the pain.  She has a history of spine surgery and takes gabapentin  for back-related issues, following up with pain clinic. She denies alcohol consumption and has been using honey to manage constipation. No fever, chills, or palpitations, dyspnea on exertion are reported.  ROS As per HPI    Objective:    BP (!) 140/80 (Cuff Size: Normal)   Pulse 72   Temp 98.4 F (36.9 C) (Oral)   Ht 5' 4 (1.626 m)   Wt 114 lb (51.7 kg)   SpO2 98%   BMI 19.57 kg/m    Physical Exam HENT:     Head: Normocephalic and atraumatic.     Mouth/Throat:     Mouth: Mucous membranes are moist.  Cardiovascular:     Rate and Rhythm: Normal rate.     Heart sounds: No murmur heard. Pulmonary:     Effort: Pulmonary effort is normal.     Breath  sounds: Normal breath sounds. No wheezing.  Chest:  Breasts:    Right: No skin change.     Comments: Palpation over right  2nd and third costosternal eliciting pain, localized to anterior chest wall. No pain elicited on palpation of right chest wall. No swelling, erythema, skin changes.  Musculoskeletal:     Right shoulder: Bony tenderness (throughout palpaton of right shoulder joint) present. No effusion, laceration or crepitus. Normal strength. Normal pulse.     Left shoulder: No effusion or laceration. Normal strength. Normal pulse.  Neurological:     Mental Status: She is alert.     No results found for any visits on 11/05/23.     Assessment & Plan:  Costochondral chest pain Assessment & Plan: Differential diagnosis includes costochondritis of right anterior chest wall, OA of right shoulder, pericarditis, angina, myocardial infarction, pleuritis, pneumothorax, GERD, peptic ulcer disease. However, pain reproducible with palpation to anterior chest wall on the right side. I recommend ED evaluation for cardiac monitoring, imaging. Patient declined.  Prescribe tizanidine  2 mg every 8 hours as needed to help with pain.  Recommend diclofenac  gel 1% up to four times a day for  pain. Advise using a heating pad for pain relief. Instruct to avoid lifting and overhead movements. Advise emergency care if symptoms  worsens.   Orders: -     Diclofenac  Sodium; Apply 2 g topically 4 (four) times daily.  Dispense: 100 g; Refill: 1 -     tiZANidine  HCl; Take 1 tablet (2 mg total) by mouth every 8 (eight) hours as needed for muscle spasms.  Dispense: 30 tablet; Refill: 0    Return if symptoms worsen or fail to improve. Keep TOC appointment as scheduled.   Luke Shade, MD

## 2023-11-05 NOTE — Assessment & Plan Note (Signed)
 Differential diagnosis includes costochondritis of right anterior chest wall, OA of right shoulder, pericarditis, angina, myocardial infarction, pleuritis, pneumothorax, GERD, peptic ulcer disease. However, pain reproducible with palpation to anterior chest wall on the right side. I recommend ED evaluation for cardiac monitoring, imaging. Patient declined.  Prescribe tizanidine  2 mg every 8 hours as needed to help with pain.  Recommend diclofenac  gel 1% up to four times a day for  pain. Advise using a heating pad for pain relief. Instruct to avoid lifting and overhead movements. Advise emergency care if symptoms worsens.

## 2023-11-19 ENCOUNTER — Encounter: Payer: Self-pay | Admitting: Nurse Practitioner

## 2023-11-19 ENCOUNTER — Ambulatory Visit: Attending: Nurse Practitioner | Admitting: Nurse Practitioner

## 2023-11-19 VITALS — BP 148/66 | HR 63 | Temp 97.3°F | Resp 18 | Ht 64.0 in | Wt 115.0 lb

## 2023-11-19 DIAGNOSIS — M5416 Radiculopathy, lumbar region: Secondary | ICD-10-CM | POA: Diagnosis not present

## 2023-11-19 DIAGNOSIS — G8929 Other chronic pain: Secondary | ICD-10-CM | POA: Diagnosis not present

## 2023-11-19 DIAGNOSIS — G894 Chronic pain syndrome: Secondary | ICD-10-CM | POA: Diagnosis not present

## 2023-11-19 DIAGNOSIS — M961 Postlaminectomy syndrome, not elsewhere classified: Secondary | ICD-10-CM | POA: Diagnosis not present

## 2023-11-19 DIAGNOSIS — M48062 Spinal stenosis, lumbar region with neurogenic claudication: Secondary | ICD-10-CM | POA: Insufficient documentation

## 2023-11-19 DIAGNOSIS — Z79899 Other long term (current) drug therapy: Secondary | ICD-10-CM | POA: Insufficient documentation

## 2023-11-19 MED ORDER — HYDROCODONE-ACETAMINOPHEN 7.5-325 MG PO TABS: 1.0000 | ORAL_TABLET | Freq: Four times a day (QID) | ORAL | 0 refills | Status: AC | PRN

## 2023-11-19 NOTE — Progress Notes (Signed)
 Nursing Pain Medication Assessment:  Safety precautions to be maintained throughout the outpatient stay will include: orient to surroundings, keep bed in low position, maintain call bell within reach at all times, provide assistance with transfer out of bed and ambulation.  Medication Inspection Compliance: Pill count conducted under aseptic conditions, in front of the patient. Neither the pills nor the bottle was removed from the patient's sight at any time. Once count was completed pills were immediately returned to the patient in their original bottle.  Medication: Hydrocodone /APAP Pill/Patch Count: 30 of 120 pills/patches remain Pill/Patch Appearance: Markings consistent with prescribed medication Bottle Appearance: Standard pharmacy container. Clearly labeled. Filled Date: 06 / 25 / 2025 Last Medication intake:  Today

## 2023-11-19 NOTE — Progress Notes (Signed)
 PROVIDER NOTE: Interpretation of information contained herein should be left to medically-trained personnel. Specific patient instructions are provided elsewhere under Patient Instructions section of medical record. This document was created in part using AI and STT-dictation technology, any transcriptional errors that may result from this process are unintentional.  Patient: Amy Grant  Service: E/M   PCP: No primary care provider on file.  DOB: 1950-10-14  DOS: 11/19/2023  Provider: Emmy MARLA Blanch, NP  MRN: [id number]  Delivery: Face-to-face  Specialty: Interventional Pain Management  Type: Established Patient  Setting: Ambulatory outpatient facility  Specialty designation: 09  Referring Prov.: No ref. provider found  Location: Outpatient office facility       History of present illness (HPI) [name], a 73 y.o. year old female, is here today because of her Chronic pain syndrome [G89.4]. Amy Grant primary complain today is Back Pain (Back pain, in between shoulder blades. Had visit with PCP, was told it was muscular. )  Pertinent problems: Amy Grant has Radiculopathy; Spinal stenosis, lumbar region, with neurogenic claudication; Lumbar degenerative disc disease; Chronic pain syndrome; and Pain management contract signed on their pertinent problem list.  Pain Assessment: Severity of Chronic pain is reported as a 5 /10. Location: Back Mid/Radiates to shoulder blades to lower back. Onset: More than a month ago. Quality: Pressure. Timing: Constant. Modifying factor(s): Medications. Vitals:  height is 5' 4 (1.626 m) and weight is 115 lb (52.2 kg). Her temporal temperature is 97.3 F (36.3 C) (abnormal). Her blood pressure is 148/66 (abnormal) and her pulse is 63. Her respiration is 18 and oxygen saturation is 99%.  BMI: Estimated body mass index is 19.74 kg/m as calculated from the following:   Height as of this encounter: 5' 4 (1.626 m).   Weight as of this encounter: 115 lb (52.2  kg).  Last encounter: 08/26/2023. Last procedure: Visit date not found.  Reason for encounter: medication management. No change in medical history since last visit.  Patient's pain is at baseline.  Patient continues multimodal pain regimen as prescribed.  States that it provides pain relief and improvement in functional status.  The patient reports experiencing chest pain, which has already been evaluated by her primary care physician.  She is scheduled for a breast mammogram, as the pain is localized more on the right breast and radiates to over the area behind the spine, which could be a costochondritis of right anterior chest wall. Pharmacotherapy Assessment   Analgesic: Hydrocodone -acetaminophen  (Norco) 7.5-325 mg tablet every 6 hours as needed for pain. MME=22.50 Monitoring: [place] PMP: PDMP reviewed during this encounter.       Pharmacotherapy: No side-effects or adverse reactions reported. Compliance: No problems identified. Effectiveness: Clinically acceptable.  Erlene Doyal [place], [place]  11/19/2023  2:50 PM  Sign when Signing Visit Nursing Pain Medication Assessment:  Safety precautions to be maintained throughout the outpatient stay will include: orient to surroundings, keep bed in low position, maintain call bell within reach at all times, provide assistance with transfer out of bed and ambulation.  Medication Inspection Compliance: Pill count conducted under aseptic conditions, in front of the patient. Neither the pills nor the bottle was removed from the patient's sight at any time. Once count was completed pills were immediately returned to the patient in their original bottle.  Medication: Hydrocodone /APAP Pill/Patch Count: 30 of 120 pills/patches remain Pill/Patch Appearance: Markings consistent with prescribed medication Bottle Appearance: Standard pharmacy container. Clearly labeled. Filled Date: 06 / 25 / 2025 Last Medication intake:  Today  UDS:  Summary  Date Value Ref Range  Status  08/26/2023 FINAL  Final    Comment:    ==================================================================== [facility] 13 (MW) ==================================================================== Test                             Result       Flag       Units  Drug Present and Declared for Prescription Verification   Hydrocodone                     915          EXPECTED   ng/mg creat   Hydromorphone                   101          EXPECTED   ng/mg creat   Dihydrocodeine                 60           EXPECTED   ng/mg creat   Norhydrocodone                 637          EXPECTED   ng/mg creat    Sources of hydrocodone  include scheduled prescription medications.    Hydromorphone , dihydrocodeine and norhydrocodone are expected    metabolites of hydrocodone . Hydromorphone  and dihydrocodeine are    also available as scheduled prescription medications.  Drug Present not Declared for Prescription Verification   Carboxy-THC                    >1163        UNEXPECTED ng/mg creat    Carboxy-THC is a metabolite of tetrahydrocannabinol (THC). Source of    THC is most commonly herbal marijuana or marijuana-based products,    but THC is also present in a scheduled prescription medication.    Trace amounts of THC can be present in hemp and cannabidiol (CBD)    products. This test is not intended to distinguish between delta-9-    tetrahydrocannabinol, the predominant form of THC in most herbal or    marijuana-based products, and delta-8-tetrahydrocannabinol.  ==================================================================== Test                      Result    Flag   Units      Ref Range   Creatinine              86               mg/dL      >=79 ==================================================================== Declared Medications:  The flagging and interpretation on this report are based on the  following declared medications.  Unexpected results may arise from  inaccuracies in the  declared medications.   **Note: The testing scope of this panel includes these medications:   Hydrocodone    **Note: The testing scope of this panel does not include the  following reported medications:   Acetaminophen   Albuterol  (Ventolin  HFA)  Calcium   Fluoride   Gabapentin   Magnesium  Multivitamin  Omeprazole   Potassium  Tizanidine   Verapamil   Vitamin D  ==================================================================== For clinical consultation, please call [phone]. ====================================================================     No results found for: CBDTHCR No results found for: D8THCCBX No results found for: D9THCCBX  ROS  Constitutional: Denies any fever or chills Gastrointestinal: No reported hemesis, hematochezia, vomiting, or  acute GI distress Musculoskeletal: Denies any acute onset joint swelling, redness, loss of ROM, or weakness Neurological: No reported episodes of acute onset apraxia, aphasia, dysarthria, agnosia, amnesia, paralysis, loss of coordination, or loss of consciousness  Medication Review  Calcium -Magnesium-Vitamin D , HYDROcodone -acetaminophen , Sod Fluoride -Potassium Nitrate , albuterol , diclofenac  Sodium, gabapentin , multivitamin with minerals, omeprazole , tiZANidine , and verapamil   History Review  Allergy: Amy Grant is allergic to latex, sulfa antibiotics, and linaclotide. Drug: Amy Grant  reports no history of drug use. Alcohol:  reports current alcohol use of about 7.0 standard drinks of alcohol per week. Tobacco:  reports that she quit smoking about 12 years ago. Her smoking use included cigarettes. She started smoking about 42 years ago. She has never used smokeless tobacco. Social: Amy Grant  reports that she quit smoking about 12 years ago. Her smoking use included cigarettes. She started smoking about 42 years ago. She has never used smokeless tobacco. She reports current alcohol use of about 7.0 standard drinks of  alcohol per week. She reports that she does not use drugs. Medical:  has a past medical history of Anxiety, Arthritis, Asthma, Blood in stool, Cervical radicular pain (11/16/2019), Chronic intermittent post-traumatic headache, Chronic neck pain with history of cervical spinal surgery (03/28/2017), Chronic radicular lumbar pain (08/07/2016), Colon polyps, Complication of anesthesia (2018), Depression, Failed back surgical syndrome (12/17/2021), Family history of adverse reaction to anesthesia, GERD (gastroesophageal reflux disease), Head injury, Headache, Hepatitis, Hepatitis C, History of blood transfusion, History of hepatitis C (03/27/2020), History of lumbar surgery (11/21/2019), Hypertension, Laryngopharyngeal reflux (LPR), Multiple falls, Oral lichen planus, Osteoarthritis (03/28/2017), Pancreatitis, Seizures (HCC), and Vitamin D  deficiency. Surgical: Amy Grant  has a past surgical history that includes Hernia repair; Cesarean section; Exploratory laparotomy; Tonsillectomy; Appendectomy; Colon resection; Colonoscopy; Anterior cervical decomp/discectomy fusion (N/A, 08/07/2016); Breast biopsy (Left); Abdominal hysterectomy (1979); acdf; Transforaminal lumbar interbody fusion (tlif) with pedicle screw fixation 1 level (Right, 03/24/2018); low back surgery; Colonoscopy with propofol  (N/A, 01/15/2023); Esophagogastroduodenoscopy (egd) with propofol  (N/A, 01/15/2023); biopsy (01/15/2023); polypectomy (01/15/2023); Cataract extraction w/PHACO (Right, 08/19/2023); and Cataract extraction w/PHACO (Left, 09/02/2023). Family: family history includes Arthritis in her maternal grandmother and sister; Breast cancer (age of onset: 51) in her sister; Cancer in her maternal grandfather, mother, and sister; Diabetes in her paternal grandmother and sister; Heart disease in her mother; Heart failure in her mother; Hyperlipidemia in her maternal grandmother; Hypertension in her mother; Learning disabilities in her maternal  grandmother; Miscarriages / [place] in her daughter, mother, sister, and sister; Multiple sclerosis in her father; Other in her sister.  Laboratory Chemistry Profile   Renal Lab Results  Component Value Date   BUN 10 03/19/2023   CREATININE 1.02 (H) 03/19/2023   BCR 10 (L) 03/19/2023   GFR 54.67 (L) 07/14/2022   GFRAA >60 03/18/2018   GFRNONAA >60 03/18/2018    Hepatic Lab Results  Component Value Date   AST 27 03/19/2023   ALT 16 03/19/2023   ALBUMIN 5.0 (H) 03/19/2023   ALKPHOS 70 03/19/2023   LIPASE 57 03/19/2023    Electrolytes Lab Results  Component Value Date   NA 142 03/19/2023   K 4.2 03/19/2023   CL 102 03/19/2023   CALCIUM  9.6 03/19/2023    Bone Lab Results  Component Value Date   VD25OH 119.53 (HH) 07/14/2022    Inflammation (CRP: Acute Phase) (ESR: Chronic Phase) No results found for: CRP, ESRSEDRATE, LATICACIDVEN       Note: Above Lab results reviewed.  Recent Imaging Review  MR LIVER W WO  CONTRAST CLINICAL DATA:  Characterize liver lesion  EXAM: MRI ABDOMEN WITHOUT AND WITH CONTRAST  TECHNIQUE: Multiplanar multisequence MR imaging of the abdomen was performed both before and after the administration of intravenous contrast.  CONTRAST:  5 mL Eovist  gadolinium contrast IV  COMPARISON:  CT abdomen pelvis, 01/28/2023  FINDINGS: Lower chest: No acute abnormality.  Hepatobiliary: Small arterially hyperenhancing lesion of the anterior right lobe of the liver hepatic segment VIII, measuring 0.6 x 0.6 cm (series 6, image 19). No appreciable underlying signal or diffusion abnormality, and this lesion is isoenhancing on all remaining contrast phases, including 20 minute delayed hepatobiliary phase. No gallstones, gallbladder wall thickening, or biliary dilatation.  Pancreas: Unremarkable. No pancreatic ductal dilatation or surrounding inflammatory changes.  Spleen: Normal in size without significant abnormality.  Adrenals/Urinary  Tract: Adrenal glands are unremarkable. Kidneys are normal, without renal calculi, solid lesion, or hydronephrosis.  Stomach/Bowel: Stomach is within normal limits. No evidence of bowel wall thickening, distention, or inflammatory changes.  Vascular/Lymphatic: Aortic atherosclerosis. No enlarged abdominal lymph nodes.  Other: No abdominal wall hernia or abnormality. No ascites.  Musculoskeletal: No acute or significant osseous findings.  IMPRESSION: 1. Small arterially hyperenhancing lesion of the anterior right lobe of the liver hepatic segment VIII, measuring 0.6 x 0.6 cm. No appreciable underlying signal or diffusion abnormality, and this lesion is isoenhancing on all remaining contrast phases, including 20 minute delayed hepatobiliary phase. This is most consistent with a small benign focal nodular hyperplasia. In the absence of known malignancy or chronic high risk liver disease (cirrhosis, chronic viral hepatitis), no further follow-up or characterization is required.  2.  No acute findings in the abdomen.  Aortic Atherosclerosis (ICD10-I70.0).  Electronically Signed   By: Marolyn JONETTA Jaksch M.D.   On: 03/31/2023 16:35 Note: Reviewed        Physical Exam  Vitals: BP (!) 148/66 (BP Location: Right Arm, Patient Position: Sitting)   Pulse 63   Temp (!) 97.3 F (36.3 C) (Temporal)   Resp 18   Ht 5' 4 (1.626 m)   Wt 115 lb (52.2 kg)   SpO2 99%   BMI 19.74 kg/m  BMI: Estimated body mass index is 19.74 kg/m as calculated from the following:   Height as of this encounter: 5' 4 (1.626 m).   Weight as of this encounter: 115 lb (52.2 kg). Ideal: Ideal body weight: 54.7 kg (120 lb 9.5 oz) General appearance: Well nourished, well developed, and well hydrated. In no apparent acute distress Mental status: Alert, oriented x 3 (person, place, & time)       Respiratory: No evidence of acute respiratory distress Eyes: PERLA   Assessment   Diagnosis Status  1. Chronic pain  syndrome   2. Chronic radicular lumbar pain   3. Spinal stenosis, lumbar region, with neurogenic claudication   4. Post laminectomy syndrome   5. Failed back surgical syndrome   6. Medication management    Controlled Controlled Controlled   Updated Problems: No problems updated.  Plan of Care  Problem-specific:  Assessment and Plan The patient continues on current medication regimen.  Prescribing drug monitoring (PDMP) reviewed; findings consistent with the use of prescribed medication and no evidence of narcotic misuse or abuse.  Urine drug screening (UDS) up-to-date.  No other new issues or concerns reported at this visit.  Schedule follow-up in 90 days for medication management.  Advice patient cardiac evaluation or ED if the symptoms worsen.    Amy Grant has a current medication  list which includes the following long-term medication(s): albuterol , calcium -magnesium-vitamin d , gabapentin , omeprazole , and verapamil .  Pharmacotherapy (Medications Ordered): Meds ordered this encounter  Medications   HYDROcodone -acetaminophen  (NORCO) 7.5-325 MG tablet    Sig: Take 1 tablet by mouth every 6 (six) hours as needed for moderate pain (pain score 4-6). Must last 30 days    Dispense:  120 tablet    Refill:  0   HYDROcodone -acetaminophen  (NORCO) 7.5-325 MG tablet    Sig: Take 1 tablet by mouth every 6 (six) hours as needed for moderate pain (pain score 4-6). Must last 30 days    Dispense:  120 tablet    Refill:  0   HYDROcodone -acetaminophen  (NORCO) 7.5-325 MG tablet    Sig: Take 1 tablet by mouth every 6 (six) hours as needed for moderate pain (pain score 4-6). Must last 30 days    Dispense:  120 tablet    Refill:  0   Orders:  No orders of the defined types were placed in this encounter.       Return in about 3 months (around 02/18/2024) for (F2F), (MM), Emmy Blanch NP.    Recent Visits Date Type Provider Dept  08/26/23 Office Visit Aicha Clingenpeel K, NP [facility]  Showing recent visits within past 90 days and meeting all other requirements Today's Visits Date Type Provider Dept  11/19/23 Office Visit Chanetta Moosman K, NP [facility]  Showing today's visits and meeting all other requirements Future Appointments Date Type Provider Dept  02/10/24 Appointment Hemi Chacko K, NP [facility]  Showing future appointments within next 90 days and meeting all other requirements  I discussed the assessment and treatment plan with the patient. The patient was provided an opportunity to ask questions and all were answered. The patient agreed with the plan and demonstrated an understanding of the instructions.  Patient advised to call back or seek an in-person evaluation if the symptoms or condition worsens.  I personally spent a total of 30 minutes in the care of the patient today including preparing to see the patient, getting/reviewing separately obtained history, performing a medically appropriate exam/evaluation, counseling and educating, placing orders, documenting clinical information in the EHR, independently interpreting results, communicating results, and coordinating care.  Duration of encounter:  minutes.  Total time on encounter, as per AMA guidelines included both the face-to-face and non-face-to-face time personally spent by the physician and/or other qualified health care professional(s) on the day of the encounter (includes time in activities that require the physician or other qualified health care professional and does not include time in activities normally performed by clinical staff). Physician's time may include the following activities when performed: Preparing to see the patient (e.g., pre-charting review of records, searching for previously ordered imaging, lab work, and nerve conduction tests) Review of prior analgesic pharmacotherapies. Reviewing PMP Interpreting ordered tests (e.g., lab work, imaging, nerve  conduction tests) Performing post-procedure evaluations, including interpretation of diagnostic procedures Obtaining and/or reviewing separately obtained history Performing a medically appropriate examination and/or evaluation Counseling and educating the patient/family/caregiver Ordering medications, tests, or procedures Referring and communicating with other health care professionals (when not separately reported) Documenting clinical information in the electronic or other health record Independently interpreting results (not separately reported) and communicating results to the patient/ family/caregiver Care coordination (not separately reported)  Note by: Emmy MARLA Blanch, NP  Date: 11/19/2023; Time: 2:52 PM

## 2023-11-20 DIAGNOSIS — Z1231 Encounter for screening mammogram for malignant neoplasm of breast: Secondary | ICD-10-CM | POA: Diagnosis not present

## 2023-11-20 LAB — HM MAMMOGRAPHY

## 2023-12-02 ENCOUNTER — Ambulatory Visit (INDEPENDENT_AMBULATORY_CARE_PROVIDER_SITE_OTHER): Admitting: *Deleted

## 2023-12-02 VITALS — Ht 64.0 in | Wt 114.0 lb

## 2023-12-02 DIAGNOSIS — Z0000 Encounter for general adult medical examination without abnormal findings: Secondary | ICD-10-CM | POA: Diagnosis not present

## 2023-12-02 NOTE — Patient Instructions (Addendum)
 Amy Grant,  Thank you for taking the time for your Medicare Wellness Visit. I appreciate your continued commitment to your health goals. Please review the care plan we discussed, and feel free to reach out if I can assist you further.  Medicare recommends these wellness visits once per year to help you and your care team stay ahead of potential health issues. These visits are designed to focus on prevention, allowing your provider to concentrate on managing your acute and chronic conditions during your regular appointments.  Please note that Annual Wellness Visits do not include a physical exam. Some assessments may be limited, especially if the visit was conducted virtually. If needed, we may recommend a separate in-person follow-up with your provider.  Ongoing Care Seeing your primary care provider every 3 to 6 months helps us  monitor your health and provide consistent, personalized care. Consider updating your vaccines.   Referrals If a referral was made during today's visit and you haven't received any updates within two weeks, please contact the referred provider directly to check on the status.  Recommended Screenings:  Health Maintenance  Topic Date Due   DTaP/Tdap/Td vaccine (1 - Tdap) Never done   Flu Shot  Never done   COVID-19 Vaccine (4 - 2025-26 season) 11/02/2023   Breast Cancer Screening  11/19/2024   Medicare Annual Wellness Visit  12/01/2024   Colon Cancer Screening  01/15/2028   DEXA scan (bone density measurement)  Completed   Hepatitis C Screening  Completed   HPV Vaccine  Aged Out   Meningitis B Vaccine  Aged Out   Pneumococcal Vaccine for age over 77  Discontinued   Zoster (Shingles) Vaccine  Discontinued       12/02/2023    3:07 PM  Advanced Directives  Does Patient Have a Medical Advance Directive? No  Would patient like information on creating a medical advance directive? No - Patient declined   Advance Care Planning is important because it: Ensures you  receive medical care that aligns with your values, goals, and preferences. Provides guidance to your family and loved ones, reducing the emotional burden of decision-making during critical moments.  Vision: Annual vision screenings are recommended for early detection of glaucoma, cataracts, and diabetic retinopathy. These exams can also reveal signs of chronic conditions such as diabetes and high blood pressure.  Dental: Annual dental screenings help detect early signs of oral cancer, gum disease, and other conditions linked to overall health, including heart disease and diabetes.  Please see the attached documents for additional preventive care recommendations.   Managing Pain Without Opioids Opioids are strong medicines used to treat moderate to severe pain. For some people, especially those who have long-term (chronic) pain, opioids may not be the best choice for pain management due to: Side effects like nausea, constipation, and sleepiness. The risk of addiction (opioid use disorder). The longer you take opioids, the greater your risk of addiction. Pain that lasts for more than 3 months is called chronic pain. Managing chronic pain usually requires more than one approach and is often provided by a team of health care providers working together (multidisciplinary approach). Pain management may be done at a pain management center or pain clinic. How to manage pain without the use of opioids Use non-opioid medicines Non-opioid medicines for pain may include: Over-the-counter or prescription non-steroidal anti-inflammatory drugs (NSAIDs). These may be the first medicines used for pain. They work well for muscle and bone pain, and they reduce swelling. Acetaminophen . This over-the-counter medicine may  work well for milder pain but not swelling. Antidepressants. These may be used to treat chronic pain. A certain type of antidepressant (tricyclics) is often used. These medicines are given in lower  doses for pain than when used for depression. Anticonvulsants. These are usually used to treat seizures but may also reduce nerve (neuropathic) pain. Muscle relaxants. These relieve pain caused by sudden muscle tightening (spasms). You may also use a pain medicine that is applied to the skin as a patch, cream, or gel (topical analgesic), such as a numbing medicine. These may cause fewer side effects than medicines taken by mouth. Do certain therapies as directed Some therapies can help with pain management. They include: Physical therapy. You will do exercises to gain strength and flexibility. A physical therapist may teach you exercises to move and stretch parts of your body that are weak, stiff, or painful. You can learn these exercises at physical therapy visits and practice them at home. Physical therapy may also involve: Massage. Heat wraps or applying heat or cold to affected areas. Electrical signals that interrupt pain signals (transcutaneous electrical nerve stimulation, TENS). Weak lasers that reduce pain and swelling (low-level laser therapy). Signals from your body that help you learn to regulate pain (biofeedback). Occupational therapy. This helps you to learn ways to function at home and work with less pain. Recreational therapy. This involves trying new activities or hobbies, such as a physical activity or drawing. Mental health therapy, including: Cognitive behavioral therapy (CBT). This helps you learn coping skills for dealing with pain. Acceptance and commitment therapy (ACT) to change the way you think and react to pain. Relaxation therapies, including muscle relaxation exercises and mindfulness-based stress reduction. Pain management counseling. This may be individual, family, or group counseling.  Receive medical treatments Medical treatments for pain management include: Nerve block injections. These may include a pain blocker and anti-inflammatory medicines. You may have  injections: Near the spine to relieve chronic back or neck pain. Into joints to relieve back or joint pain. Into nerve areas that supply a painful area to relieve body pain. Into muscles (trigger point injections) to relieve some painful muscle conditions. A medical device placed near your spine to help block pain signals and relieve nerve pain or chronic back pain (spinal cord stimulation device). Acupuncture. Follow these instructions at home Medicines Take over-the-counter and prescription medicines only as told by your health care provider. If you are taking pain medicine, ask your health care providers about possible side effects to watch out for. Do not drive or use heavy machinery while taking prescription opioid pain medicine. Lifestyle  Do not use drugs or alcohol to reduce pain. If you drink alcohol, limit how much you have to: 0-1 drink a day for women who are not pregnant. 0-2 drinks a day for men. Know how much alcohol is in a drink. In the [place] one drink equals one 12 oz bottle of beer (355 mL), one 5 oz glass of wine (148 mL), or one 1 oz glass of hard liquor (44 mL). Do not use any products that contain nicotine or tobacco. These products include cigarettes, chewing tobacco, and vaping devices, such as e-cigarettes. If you need help quitting, ask your health care provider. Eat a healthy diet and maintain a healthy weight. Poor diet and excess weight may make pain worse. Eat foods that are high in fiber. These include fresh fruits and vegetables, whole grains, and beans. Limit foods that are high in fat and processed sugars, such as  fried and sweet foods. Exercise regularly. Exercise lowers stress and may help relieve pain. Ask your health care provider what activities and exercises are safe for you. If your health care provider approves, join an exercise class that combines movement and stress reduction. Examples include yoga and tai chi. Get enough sleep. Lack of sleep may  make pain worse. Lower stress as much as possible. Practice stress reduction techniques as told by your therapist. General instructions Work with all your pain management providers to find the treatments that work best for you. You are an important member of your pain management team. There are many things you can do to reduce pain on your own. Consider joining an online or in-person support group for people who have chronic pain. Keep all follow-up visits. This is important. Where to find more information You can find more information about managing pain without opioids from: American Academy of Pain Medicine: painmed.org Institute for Chronic Pain: instituteforchronicpain.org American Chronic Pain Association: theacpa.org Contact a health care provider if: You have side effects from pain medicine. Your pain gets worse or does not get better with treatments or home therapy. You are struggling with anxiety or depression. Summary Many types of pain can be managed without opioids. Chronic pain may respond better to pain management without opioids. Pain is best managed when you and a team of health care providers work together. Pain management without opioids may include non-opioid medicines, medical treatments, physical therapy, mental health therapy, and lifestyle changes. Tell your health care providers if your pain gets worse or is not being managed well enough. This information is not intended to replace advice given to you by your health care provider. Make sure you discuss any questions you have with your health care provider. Document Revised: 05/30/2020 Document Reviewed: 05/30/2020 [employer]vier Patient Education  2024 [employer].

## 2023-12-02 NOTE — Progress Notes (Signed)
 Subjective:   Amy Grant is a 73 y.o. who presents for a Medicare Wellness preventive visit.  As a reminder, Annual Wellness Visits don't include a physical exam, and some assessments may be limited, especially if this visit is performed virtually. We may recommend an in-person follow-up visit with your provider if needed.  Visit Complete: Virtual I connected with  Amy Grant on 12/02/23 by a audio enabled telemedicine application and verified that I am speaking with the correct person using two identifiers.  Patient Location: Home  Provider Location: Home Office  I discussed the limitations of evaluation and management by telemedicine. The patient expressed understanding and agreed to proceed.  Vital Signs: Because this visit was a virtual/telehealth visit, some criteria may be missing or patient reported. Any vitals not documented were not able to be obtained and vitals that have been documented are patient reported.  VideoDeclined- This patient declined Librarian, academic. Therefore the visit was completed with audio only.  Persons Participating in Visit: Patient.  AWV Questionnaire: No: Patient Medicare AWV questionnaire was not completed prior to this visit.  Cardiac Risk Factors include: advanced age (>89men, >71 women);hypertension     Objective:    Today's Vitals   12/02/23 1455  Weight: 114 lb (51.7 kg)  Height: 5' 4 (1.626 m)   Body mass index is 19.57 kg/m.     12/02/2023    3:07 PM 11/19/2023    2:33 PM 09/02/2023    8:57 AM 08/26/2023    2:06 PM 08/19/2023    7:05 AM 03/31/2023    9:44 AM 01/15/2023    8:34 AM  Advanced Directives  Does Patient Have a Medical Advance Directive? No No No No No No Yes  Type of Advance Directive       Living will  Would patient like information on creating a medical advance directive? No - Patient declined  No - Patient declined No - Patient declined       Current Medications  (verified) Outpatient Encounter Medications as of 12/02/2023  Medication Sig   albuterol  (VENTOLIN  HFA) 108 (90 Base) MCG/ACT inhaler Inhale 1-2 puffs into the lungs every 6 (six) hours as needed for wheezing or shortness of breath.   Calcium -Magnesium-Vitamin D  (CALCIUM  1200+D3 PO) Take 2 tablets by mouth daily.   gabapentin  (NEURONTIN ) 300 MG capsule TAKE 2 CAPSULES(600 MG) BY MOUTH TWICE DAILY   HYDROcodone -acetaminophen  (NORCO) 7.5-325 MG tablet Take 1 tablet by mouth every 6 (six) hours as needed for moderate pain (pain score 4-6). Must last 30 days   [START ON 12/19/2023] HYDROcodone -acetaminophen  (NORCO) 7.5-325 MG tablet Take 1 tablet by mouth every 6 (six) hours as needed for moderate pain (pain score 4-6). Must last 30 days   [START ON 01/18/2024] HYDROcodone -acetaminophen  (NORCO) 7.5-325 MG tablet Take 1 tablet by mouth every 6 (six) hours as needed for moderate pain (pain score 4-6). Must last 30 days   Multiple Vitamins-Minerals (MULTIVITAMIN WITH MINERALS) tablet Take 1 tablet by mouth daily.   omeprazole  (PRILOSEC) 40 MG capsule Take 1 capsule (40 mg total) by mouth daily.   PREVIDENT  5000 SENSITIVE 1.1-5 % GEL Take 1 Application by mouth See admin instructions.   tiZANidine  (ZANAFLEX ) 2 MG tablet Take 1 tablet (2 mg total) by mouth every 8 (eight) hours as needed for muscle spasms.   verapamil  (CALAN -SR) 240 MG CR tablet TAKE 1 TABLET(240 MG) BY MOUTH AT BEDTIME   No facility-administered encounter medications on file as of 12/02/2023.  Allergies (verified) Latex, Sulfa antibiotics, and Linaclotide   History: Past Medical History:  Diagnosis Date   Anxiety    Arthritis    right hand, DDD L 4 , L 5   Asthma    Blood in stool    Cervical radicular pain 11/16/2019   Chronic intermittent post-traumatic headache    Chronic neck pain with history of cervical spinal surgery 03/28/2017   S/p surgery Dr. Beuford   Chronic radicular lumbar pain 08/07/2016   Reviewed MRI  lumbar 04/2016 degenerative changes worse L5-S1 disc desiccation with ht loss, b/l facet effusion, synovial cysts, mild facet arthropathy, disc bulging and arthropathy at some areas marked also marked right neural foraminal narrowing with compression of exiting nerve rool L4/5, L5/S1 disc bulge and b/l foraminal extension of marked facet arthropathy>>>severe b/l neural foraminal stenos   Colon polyps    Complication of anesthesia 2018   difficulty waking up    Depression    Failed back surgical syndrome 12/17/2021   Family history of adverse reaction to anesthesia    Sister woke up during surgery   GERD (gastroesophageal reflux disease)    Head injury    Headache    h/o migraines    Hepatitis    Hepatitis C    treated with mavyret  per GI   History of blood transfusion    History of hepatitis C 03/27/2020   History of lumbar surgery 11/21/2019   Hypertension    Laryngopharyngeal reflux (LPR)    Multiple falls    Oral lichen planus    Osteoarthritis 03/28/2017   Pancreatitis    remote hx w/o h/o drinking at that time   Seizures [facility])     after head injury   Vitamin D  deficiency    Past Surgical History:  Procedure Laterality Date   ABDOMINAL HYSTERECTOMY  1979   has an ovary left s/p hysterectomy 1979; h/o abnormal pap    acdf     08/07/16 Dr. Beuford    ANTERIOR CERVICAL DECOMP/DISCECTOMY FUSION N/A 08/07/2016   Procedure: ANTERIOR CERVICAL DECOMPRESSION FUSION, CERVICAL FOUR-FIVE  WITH INSTRUMENTATION AND ALLOGRAFT;  Surgeon: Beuford Anes, MD;  Location: [facility] OR;  Service: Orthopedics;  Laterality: N/A;   APPENDECTOMY     BIOPSY  01/15/2023   Procedure: BIOPSY;  Surgeon: Jinny Carmine, MD;  Location: [facility];  Service: Endoscopy;;   BREAST BIOPSY Left    over 20 years ago negative left breast as of 03/2020    CATARACT EXTRACTION W/PHACO Right 08/19/2023   Procedure: PHACOEMULSIFICATION, CATARACT, WITH IOL INSERTION 6.04 00:48.3;  Surgeon: Mittie Gaskin, MD;   Location: [facility];  Service: Ophthalmology;  Laterality: Right;   CATARACT EXTRACTION W/PHACO Left 09/02/2023   Procedure: PHACOEMULSIFICATION, CATARACT, WITH IOL INSERTION 5.28 00:32.6;  Surgeon: Mittie Gaskin, MD;  Location: [facility];  Service: Ophthalmology;  Laterality: Left;   CESAREAN SECTION     COLON RESECTION     paritial ? reason done in [place]     COLONOSCOPY     COLONOSCOPY WITH PROPOFOL  N/A 01/15/2023   Procedure: COLONOSCOPY WITH PROPOFOL ;  Surgeon: Jinny Carmine, MD;  Location: [facility];  Service: Endoscopy;  Laterality: N/A;   ESOPHAGOGASTRODUODENOSCOPY (EGD) WITH PROPOFOL  N/A 01/15/2023   Procedure: ESOPHAGOGASTRODUODENOSCOPY (EGD) WITH PROPOFOL ;  Surgeon: Jinny Carmine, MD;  Location: [facility];  Service: Endoscopy;  Laterality: N/A;   EXPLORATORY LAPAROTOMY     after C Section  x 2 - 1 for bleeding and 1 for infection   HERNIA REPAIR  Inguinal - as a child   low back surgery     Dr. Beuford    POLYPECTOMY  01/15/2023   Procedure: POLYPECTOMY;  Surgeon: Jinny Carmine, MD;  Location: [facility];  Service: Endoscopy;;   TONSILLECTOMY     TRANSFORAMINAL LUMBAR INTERBODY FUSION (TLIF) WITH PEDICLE SCREW FIXATION 1 LEVEL Right 03/24/2018   Procedure: RIGHT LUMBAR 4 - LUMBAR 5 TRANSFORAMINAL LUMBAR INTERBODY FUSION WITH INSTRUMENTATION AND ALLOGRAFT;  Surgeon: Beuford Anes, MD;  Location: [facility] OR;  Service: Orthopedics;  Laterality: Right;   Family History  Problem Relation Age of Onset   Breast cancer Sister 64   Cancer Sister        breast    Diabetes Sister    Miscarriages / [place] Sister    Cancer Mother        breast/precancer changes   Heart disease Mother    Hypertension Mother    Miscarriages / Stillbirths Mother    Heart failure Mother    Multiple sclerosis Father    Miscarriages / [place] Daughter    Arthritis Maternal Grandmother    Hyperlipidemia Maternal Grandmother    Learning disabilities Maternal  Grandmother    Cancer Maternal Grandfather    Diabetes Paternal Grandmother    Arthritis Sister    Miscarriages / [place] Sister    Other Sister        precancerous changes breast   Social History   Socioeconomic History   Marital status: Divorced    Spouse name: Not on file   Number of children: Not on file   Years of education: Not on file   Highest education level: Associate degree: academic program  Occupational History   Not on file  Tobacco Use   Smoking status: Former    Current packs/day: 0.00    Types: Cigarettes    Start date: 74    Quit date: 2013    Years since quitting: 12.7   Smokeless tobacco: Never  Vaping Use   Vaping status: Never Used  Substance and Sexual Activity   Alcohol use: Yes    Alcohol/week: 7.0 standard drinks of alcohol    Types: 7 Glasses of wine per week    Comment: per week   Drug use: No   Sexual activity: Not on file  Other Topics Concern   Not on file  Social History Narrative   ** Merged History Encounter **       Associate degree  Wears seat belt, feels safe in marriage to Ron 1 daughter    Social Drivers of Corporate investment banker Strain: Low Risk  (12/02/2023)   Overall Financial Resource Strain (CARDIA)    Difficulty of Paying Living Expenses: Not hard at all  Food Insecurity: No Food Insecurity (12/02/2023)   Hunger Vital Sign    Worried About Running Out of Food in the Last Year: Never true    Ran Out of Food in the Last Year: Never true  Transportation Needs: No Transportation Needs (12/02/2023)   PRAPARE - Administrator, Civil Service (Medical): No    Lack of Transportation (Non-Medical): No  Physical Activity: Insufficiently Active (12/02/2023)   Exercise Vital Sign    Days of Exercise per Week: 2 days    Minutes of Exercise per Session: 30 min  Stress: No Stress Concern Present (12/02/2023)   [employer] of Occupational Health - Occupational Stress Questionnaire    Feeling of Stress:  Only a little  Recent Concern: Stress - Stress  Concern Present (11/04/2023)   [employer] of Occupational Health - Occupational Stress Questionnaire    Feeling of Stress: To some extent  Social Connections: Moderately Integrated (12/02/2023)   Social Connection and Isolation Panel    Frequency of Communication with Friends and Family: More than three times a week    Frequency of Social Gatherings with Friends and Family: Twice a week    Attends Religious Services: More than 4 times per year    Active Member of [employer] or Organizations: No    Attends Engineer, structural: Never    Marital Status: Living with partner    Tobacco Counseling Counseling given: Not Answered    Clinical Intake:  Pre-visit preparation completed: Yes  Pain : No/denies pain     BMI - recorded: 19.57 Nutritional Risks: None Diabetes: No  Lab Results  Component Value Date   HGBA1C 5.3 07/14/2022     How often do you need to have someone help you when you read instructions, pamphlets, or other written materials from your doctor or pharmacy?: 1 - Never  Interpreter Needed?: No  Information entered by :: R. Avelyn Touch LPN   Activities of Daily Living     12/02/2023    2:57 PM 11/28/2023    5:26 PM  In your present state of health, do you have any difficulty performing the following activities:  Hearing? 0 0  Vision? 0 0  Difficulty concentrating or making decisions? 0 0  Walking or climbing stairs? 0 0  Dressing or bathing? 0 0  Doing errands, shopping? 0 0  Preparing Food and eating ? N N  Using the Toilet? N N  In the past six months, have you accidently leaked urine? N N  Do you have problems with loss of bowel control? N N  Managing your Medications? N N  Managing your Finances? N N  Housekeeping or managing your Housekeeping? N N    Patient Care Team: Marcelino Nurse, MD as Consulting Physician (Pain Medicine) Jinny Carmine, MD as Consulting Physician (Gastroenterology) Marcelino Nurse, MD as Consulting Physician (Pain Medicine)  I have updated your Care Teams any recent Medical Services you may have received from other providers in the past year.     Assessment:   This is a routine wellness examination for Amy Grant.  Hearing/Vision screen Hearing Screening - Comments:: No issues Vision Screening - Comments:: glasses   Goals Addressed             This Visit's Progress    Patient Stated       Wants to stay active        Depression Screen     12/02/2023    3:01 PM 11/19/2023    2:33 PM 11/05/2023    4:04 PM 07/21/2023    2:43 PM 12/04/2022    2:56 PM 11/25/2022    3:53 PM 07/30/2022    3:50 PM  PHQ 2/9 Scores  PHQ - 2 Score 0 0 1 1 0 0 2  PHQ- 9 Score 5  9 5  6 10     Fall Risk     12/02/2023    2:59 PM 11/28/2023    5:26 PM 11/19/2023    2:32 PM 11/05/2023    4:03 PM 08/26/2023    2:05 PM  Fall Risk   Falls in the past year? 0 0 0 0 0  Number falls in past yr: 0 0 0 0   Injury with Fall? 0 0 0 0  Risk for fall due to : No Fall Risks   No Fall Risks   Follow up Falls evaluation completed;Falls prevention discussed   Falls evaluation completed     MEDICARE RISK AT HOME:  Medicare Risk at Home Any stairs in or around the home?: Yes If so, are there any without handrails?: No Home free of loose throw rugs in walkways, pet beds, electrical cords, etc?: Yes Adequate lighting in your home to reduce risk of falls?: Yes Life alert?: No Use of a cane, walker or w/c?: No Grab bars in the bathroom?: Yes Shower chair or bench in shower?: No Elevated toilet seat or a handicapped toilet?: No  TIMED UP AND GO:  Was the test performed?  No  Cognitive Function: 6CIT completed    12/24/2017    2:11 PM  MMSE - Mini Mental State Exam  Orientation to time 5  Orientation to Place 5  Registration 3  Attention/ Calculation 5  Recall 3  Language- name 2 objects 2  Language- repeat 1  Language- follow 3 step command 3  Language- read & follow direction  1  Write a sentence 1  Copy design 1  Total score 30        12/02/2023    3:07 PM 11/25/2022    4:00 PM 07/27/2019    1:54 PM  6CIT Screen  What Year? 0 points 0 points 0 points  What month? 0 points 0 points 0 points  What time? 0 points 0 points 0 points  Count back from 20 0 points 0 points   Months in reverse 0 points 0 points 0 points  Repeat phrase 0 points 4 points   Total Score 0 points 4 points     Immunizations Immunization History  Administered Date(s) Administered   PFIZER Comirnaty(Gray Top)Covid-19 Tri-Sucrose Vaccine 04/17/2020   PFIZER(Purple Top)SARS-COV-2 Vaccination 08/13/2019, 09/10/2019    Screening Tests Health Maintenance  Topic Date Due   DTaP/Tdap/Td (1 - Tdap) Never done   Influenza Vaccine  Never done   COVID-19 Vaccine (4 - 2025-26 season) 11/02/2023   Medicare Annual Wellness (AWV)  11/25/2023   Mammogram  11/19/2024   Colonoscopy  01/15/2028   DEXA SCAN  Completed   Hepatitis C Screening  Completed   HPV VACCINES  Aged Out   Meningococcal B Vaccine  Aged Out   Pneumococcal Vaccine: 50+ Years  Discontinued   Zoster Vaccines- Shingrix  Discontinued    Health Maintenance Items Addressed: Patient declines vaccines at this time.  Patient stated that she does not think that she has ever had chicken pox and will discuss with PCP at upcoming visit.  Additional Screening:  Vision Screening: Recommended annual ophthalmology exams for early detection of glaucoma and other disorders of the eye. Is the patient up to date with their annual eye exam?  Yes  Who is the provider or what is the name of the office in which the patient attends annual eye exams? [facility]  Dental Screening: Recommended annual dental exams for proper oral hygiene  Community Resource Referral / Chronic Care Management: CRR required this visit?  No   CCM required this visit?  No   Plan:    I have personally reviewed and noted the following in the patient's chart:    Medical and social history Use of alcohol, tobacco or illicit drugs  Current medications and supplements including opioid prescriptions. Patient is currently taking opioid prescriptions. Information provided to patient regarding non-opioid alternatives. Patient advised to discuss  non-opioid treatment plan with their provider. Functional ability and status Nutritional status Physical activity Advanced directives List of other physicians Hospitalizations, surgeries, and ER visits in previous 12 months Vitals Screenings to include cognitive, depression, and falls Referrals and appointments  In addition, I have reviewed and discussed with patient certain preventive protocols, quality metrics, and best practice recommendations. A written personalized care plan for preventive services as well as general preventive health recommendations were provided to patient.   Angeline Fredericks, LPN   [id number]/10/[id number]   After Visit Summary: (MyChart) Due to this being a telephonic visit, the after visit summary with patients personalized plan was offered to patient via MyChart   Notes: Nothing significant to report at this time.

## 2023-12-06 ENCOUNTER — Other Ambulatory Visit: Payer: Self-pay

## 2023-12-06 DIAGNOSIS — R0789 Other chest pain: Secondary | ICD-10-CM

## 2023-12-08 ENCOUNTER — Ambulatory Visit: Payer: Self-pay

## 2023-12-08 NOTE — Telephone Encounter (Signed)
 FYI Only or Action Required?: FYI only for provider.  Patient was last seen in primary care on 11/05/2023 by Amy Bruckner, MD.  Called Nurse Triage reporting Diarrhea.  Symptoms began several days ago.  Interventions attempted: OTC medications: Antacids and Other: Ginger ale.  Symptoms are: gradually worsening.  Triage Disposition: See Physician Within 24 Hours  Patient/caregiver understands and will follow disposition?: Yes      Copied from CRM #[id number]. Topic: Clinical - Red Word Triage >> Dec 08, 2023  1:38 PM Amy Grant wrote: Red Word that prompted transfer to Nurse Triage: Since the 5th, having deep chills, diarrhea, pain in stomach and lower back. Eat a little bit and is nauseous. Mucus is in diarrhea. Reason for Disposition  [1] MODERATE diarrhea (e.g., 4-6 times / day more than normal) AND [2] present > 48 hours (2 days)  Answer Assessment - Initial Assessment Questions 1. DIARRHEA SEVERITY: How bad is the diarrhea? How many more stools have you had in the past 24 hours than normal?      3-4 episodes  2. ONSET: When did the diarrhea begin?      12/06/23 3. STOOL DESCRIPTION:  How loose or watery is the diarrhea? What is the stool color? Is there any blood or mucous in the stool?     Mucus in stool  4. VOMITING: Are you also vomiting? If Yes, ask: How many times in the past 24 hours?      Yes  5. ABDOMEN PAIN: Are you having any abdomen pain? If Yes, ask: What does it feel like? (e.g., crampy, dull, intermittent, constant)      Yes, pressure and stabbing feeling  6. ABDOMEN PAIN SEVERITY: If present, ask: How bad is the pain?  (e.g., Scale 1-10; mild, moderate, or severe)     Severe  7. ORAL INTAKE: If vomiting, Have you been able to drink liquids? How much liquids have you had in the past 24 hours?     Drinking water and able to keep that down   8. HYDRATION: Any signs of dehydration? (e.g., dry mouth [not just dry lips], too weak to stand,  dizziness, new weight loss) When did you last urinate?     Some dizziness  9. ANTIBIOTIC USE: Are you taking antibiotics now or have you taken antibiotics in the past 2 months?       Denies  10. OTHER SYMPTOMS: Do you have any other symptoms? (e.g., fever, blood in stool)       Nausea, chills, lack of appetite  Protocols used: Diarrhea-A-AH

## 2023-12-09 ENCOUNTER — Ambulatory Visit (INDEPENDENT_AMBULATORY_CARE_PROVIDER_SITE_OTHER)

## 2023-12-09 ENCOUNTER — Ambulatory Visit

## 2023-12-09 ENCOUNTER — Ambulatory Visit: Payer: Self-pay

## 2023-12-09 VITALS — BP 100/72 | HR 85 | Temp 98.1°F | Ht 64.0 in | Wt 109.0 lb

## 2023-12-09 DIAGNOSIS — K5900 Constipation, unspecified: Secondary | ICD-10-CM | POA: Diagnosis not present

## 2023-12-09 DIAGNOSIS — R1084 Generalized abdominal pain: Secondary | ICD-10-CM

## 2023-12-09 DIAGNOSIS — R748 Abnormal levels of other serum enzymes: Secondary | ICD-10-CM | POA: Insufficient documentation

## 2023-12-09 DIAGNOSIS — R945 Abnormal results of liver function studies: Secondary | ICD-10-CM | POA: Insufficient documentation

## 2023-12-09 LAB — COMPREHENSIVE METABOLIC PANEL WITH GFR
ALT: 25 U/L (ref 0–35)
AST: 60 U/L — ABNORMAL HIGH (ref 0–37)
Albumin: 4.5 g/dL (ref 3.5–5.2)
Alkaline Phosphatase: 75 U/L (ref 39–117)
BUN: 12 mg/dL (ref 6–23)
CO2: 30 meq/L (ref 19–32)
Calcium: 8.9 mg/dL (ref 8.4–10.5)
Chloride: 100 meq/L (ref 96–112)
Creatinine, Ser: 1.12 mg/dL (ref 0.40–1.20)
GFR: 49.52 mL/min — ABNORMAL LOW (ref >60.00)
Glucose, Bld: 93 mg/dL (ref 70–99)
Potassium: 3.8 meq/L (ref 3.5–5.1)
Sodium: 140 meq/L (ref 135–145)
Total Bilirubin: 0.8 mg/dL (ref 0.2–1.2)
Total Protein: 7 g/dL (ref 6.0–8.3)

## 2023-12-09 LAB — TSH: TSH: 0.8 u[IU]/mL (ref 0.35–5.50)

## 2023-12-09 LAB — CBC WITH DIFFERENTIAL/PLATELET
Basophils Absolute: 0 K/uL (ref 0.0–0.1)
Basophils Relative: 0.4 % (ref 0.0–3.0)
Eosinophils Absolute: 0 K/uL (ref 0.0–0.7)
Eosinophils Relative: 0.2 % (ref 0.0–5.0)
HCT: 41.8 % (ref 36.0–46.0)
Hemoglobin: 13.9 g/dL (ref 12.0–15.0)
Lymphocytes Relative: 32.5 % (ref 12.0–46.0)
Lymphs Abs: 1.5 K/uL (ref 0.7–4.0)
MCHC: 33.4 g/dL (ref 30.0–36.0)
MCV: 86.9 fl (ref 78.0–100.0)
Monocytes Absolute: 0.8 K/uL (ref 0.1–1.0)
Monocytes Relative: 17.3 % — ABNORMAL HIGH (ref 3.0–12.0)
Neutro Abs: 2.2 K/uL (ref 1.4–7.7)
Neutrophils Relative %: 49.6 % (ref 43.0–77.0)
Platelets: 123 K/uL — ABNORMAL LOW (ref 150.0–400.0)
RBC: 4.8 Mil/uL (ref 3.87–5.11)
RDW: 13.3 % (ref 11.5–15.5)
WBC: 4.5 K/uL (ref 4.0–10.5)

## 2023-12-09 LAB — LIPASE: Lipase: 81 U/L — ABNORMAL HIGH (ref 11.0–59.0)

## 2023-12-09 MED ORDER — ONDANSETRON 4 MG PO TBDP: 4.0000 mg | ORAL_TABLET | Freq: Every day | ORAL | 0 refills | Status: DC | PRN

## 2023-12-09 NOTE — Patient Instructions (Addendum)
-   Increase Omeprazole  40 mg, twice a day for for 2 weeks than once a day after that.  - For nausea: Take Zofran  1 tablet only as needed. I am sending 20 tablets, you should not need a refill for this.  - Try bland food for the next 2 weeks, information attached with your AVS. Avoid coffee, alcohol, tea for now to help reduce stomach pain. Drink water, Gatorade.     What is a Best boy Diet? A bland diet is made up of foods that are soft, not spicy, and low in fiber. It is gentle on your stomach and intestines. This diet is often recommended if you have digestive problems, are recovering from surgery, or have conditions like ulcers, gastritis, or diarrhea.  Goals of a Bland Diet:  Reduce irritation in your stomach and intestines Help your digestive system heal Prevent discomfort like nausea, heartburn, or diarrhea Foods You Can Eat:  Cooked cereals (cream of wheat, oatmeal) White rice, plain pasta, or noodles Boiled or mashed potatoes (without skin) Cooked, skinless chicken or [place] Lean ground beef or fish (baked or broiled, not fried) Eggs (boiled or scrambled) Low-fat dairy (milk, yogurt, cheese) Canned or cooked fruits (without seeds or skin, like applesauce or bananas) Cooked vegetables (carrots, green beans, squash, potatoes--no skin) White bread, plain bagels, or crackers Clear soups or broths  Foods to Avoid:  Spicy foods, hot sauces, and strong seasonings Fried or fatty foods Raw vegetables and salads Whole grains, brown rice, and high-fiber cereals Nuts, seeds, and popcorn Citrus fruits and juices (orange, grapefruit, lemon) Tomato products (sauce, juice, raw tomatoes) Caffeinated drinks (coffee, tea, cola) Alcohol Chocolate Tips for Following a Bland Diet:  Eat small, frequent meals throughout the day Chew your food well and eat slowly Drink plenty of fluids, but avoid drinking large amounts at once Avoid foods that are very hot or very cold Avoid using strong  spices or seasonings

## 2023-12-09 NOTE — Progress Notes (Signed)
 Acute Office Visit  Subjective:    Patient ID: Amy Grant, female    DOB: 01-Nov-1950, 73 y.o.   MRN: [id number]  Chief Complaint  Patient presents with   Diarrhea   Patient is in today for following acute concerns: HPI Discussed the use of AI scribe software for clinical note transcription with the patient, who gave verbal consent to proceed.  History of Present Illness Amy Grant is a 73 year old female with chronic constipation who presents with abdominal pain and gastrointestinal symptoms that started on 12/07/23.  She has been experiencing abdominal pain since October 5th, which is diffuse across the abdomen and worsens with sitting up, standing, or applying pressure. She had one episode of chills and feels unusually cold, describing the exam room as freezing.   She has nausea triggered by the sight of food, despite having an appetite, and feels an urge to vomit before eating. On the night of October 6th, she had an episode of uncontrollable diarrhea, followed by no bowel movements since then. She has a history of chronic constipation and was prescribed lactulose  in February, which she has not taken since. She is currently taking hydrocodone  for pain management and sees pain clinic.   She stopped consuming alcohol in January due to health concerns, having previously consumed wine regularly. She uses honey daily but has avoided it recently due to nausea. Her current medications include verapamil  for blood pressure and omeprazole  for acid reflux, taken daily. No vomiting has occurred, but she continues to feel nauseated.  Pertinent positive:  CT abdomen pelvis with contrast from 01/28/2023:  6 mm hypervascular lesion in right hepatic lobe, with differential diagnosis including flash-filling hemangioma, focal nodular hyperplasia, or hepatic adenoma. Recommend follow-up by imaging in 6 months, preferably with abdomen MRI without and with Eovist  contrast. Large colonic stool  burden.   MRI abdomen with and without contrast 03/25/2023: 0.6 cm benign liver lesion consistent with focal nodular hyperplasia.  No further imaging recommended.  No acute abnormality.   01/15/2023 EGD and colonoscopy: gastritis, small hiatal hernia, biopsy negative for H. Pylori. On Omeprazole  40 mg daily.  Colon polyps removed on colonoscopy  5 years repeat recommended.   2019: Hepatitis C positive, treated and negative viral load on 2021.      ROS As per HPI    Objective:    BP 100/72   Pulse 85   Temp 98.1 F (36.7 C) (Oral)   Ht 5' 4 (1.626 m)   Wt 109 lb (49.4 kg)   SpO2 99%   BMI 18.71 kg/m    Physical Exam Constitutional:      General: She is not in acute distress. HENT:     Head: Normocephalic and atraumatic.     Right Ear: Tympanic membrane normal.     Nose: No congestion.     Mouth/Throat:     Mouth: Mucous membranes are moist.     Pharynx: Oropharynx is clear.  Eyes:     General: No scleral icterus.    Pupils: Pupils are equal, round, and reactive to light.  Cardiovascular:     Rate and Rhythm: Normal rate.  Pulmonary:     Effort: Pulmonary effort is normal.     Breath sounds: Normal breath sounds. No wheezing.  Abdominal:     General: Abdomen is flat. Bowel sounds are increased. There is no distension.     Tenderness: There is abdominal tenderness (generalized abdominal tenderness on palpation of all 4 abdominal quadrant).  There is no guarding.  Musculoskeletal:     Right lower leg: No edema.     Left lower leg: No edema.  Lymphadenopathy:     Cervical: No cervical adenopathy.  Skin:    General: Skin is warm.  Neurological:     Mental Status: She is alert and oriented to person, place, and time.  Psychiatric:        Mood and Affect: Mood normal.     No results found for any visits on 12/09/23.     Assessment & Plan:  Generalized abdominal pain Assessment & Plan: D/D includes gastritis (already has a h/o GERD at baseline, hiatal hernia  on EGD in 2024, opoid-induced GI disorder, IBS, bowel obstruction (less likely no vomiting, passing gas), pancreatitis.  Obtain KUB, CBC, TSH, CMP, lipase.  Start Zofran  4 mg prn daily for nausea.  Increase frequency of Omeprazole  from 40 mg daily to 40 mg BID. Has prescription sent to from GI in the past, not needing new refill.  Start BLAND diet, continue for 2 weeks.  Advise abstinence from alcohol and vaping THC.  Further management pending results.    Orders: -     Comprehensive metabolic panel with GFR -     CBC with Differential/Platelet -     TSH -     Lipase -     DG Abd 1 View; Future  Constipation, unspecified constipation type Assessment & Plan: Chronic history. On chronic Norco for pain. Concerning for opoid induced constipation. Diarrhea on 10/6 one episode concerning for overflow diarrhea.  Recommend KUB to evaluate stool burden.  Check TSH to r/o hypothyroidism.  Consider restarting Lactulose  20 gm daily with stool softer 100 mg once daily after imaging, labs are back. Encourage dietary fiber 30 g/day once current episode of illness is better. Hydration with 2L/min water daily, regular exercise and recommend establishing regular toileting routine.  Reached out to GI to have patient reevaluated as she has not followed up with them since 04/2023.     Orders: -     TSH -     Lipase  Other orders -     Ondansetron ; Take 1 tablet (4 mg total) by mouth daily as needed for nausea or vomiting.  Dispense: 20 tablet; Refill: 0    Return in about 1 month (around 01/11/2024) for For TOC as scheduled .  Luke Shade, MD

## 2023-12-09 NOTE — Assessment & Plan Note (Signed)
 D/D includes gastritis (already has a h/o GERD at baseline, hiatal hernia on EGD in 2024, opoid-induced GI disorder, IBS, bowel obstruction (less likely no vomiting, passing gas), pancreatitis.  Obtain KUB, CBC, TSH, CMP, lipase.  Start Zofran  4 mg prn daily for nausea.  Increase frequency of Omeprazole  from 40 mg daily to 40 mg BID. Has prescription sent to from GI in the past, not needing new refill.  Start BLAND diet, continue for 2 weeks.  Advise abstinence from alcohol and vaping THC.  Further management pending results.

## 2023-12-09 NOTE — Progress Notes (Signed)
 Please let the patient know I reviewed her lab results. She has mild elevation in lipase but not significantly elevated to suggest pancreatitis. Lipase is usually increased at 3-4 times upper limit of normal for diagnosis of pancreatitis. She has mild elevation in AST and slightly reduced kidney function. She has otherwise normal electrolytes, CBC shows normal WBC, hemoglobin but slight reduction in platelets. I recommend we repeat labs next week to make sure these are normalizing. If abdominal pain is worse than what it was in the clinic, she develops vomiting, is not able to pass gas, develops fever she should be seen in the ER. I have ordered future labs.   Thank you,  Luke Shade, MD  ------ 1. Generalized abdominal pain (Primary) - COMPLETE METABOLIC PANEL WITH eGFR; Future - CBC w/Diff; Future  2. Elevated lipase - Lipase; Future  3. Abnormal liver function - COMPLETE METABOLIC PANEL WITH eGFR; Future  Luke Shade, MD

## 2023-12-09 NOTE — Assessment & Plan Note (Addendum)
 Chronic history. On chronic Norco for pain. Concerning for opoid induced constipation. Diarrhea on 10/6 one episode concerning for overflow diarrhea.  Recommend KUB to evaluate stool burden.  Check TSH to r/o hypothyroidism.  Consider restarting Lactulose  20 gm daily with stool softer 100 mg once daily after imaging, labs are back. Encourage dietary fiber 30 g/day once current episode of illness is better. Hydration with 2L/min water daily, regular exercise and recommend establishing regular toileting routine.  Reached out to GI to have patient reevaluated as she has not followed up with them since 04/2023.

## 2023-12-10 ENCOUNTER — Ambulatory Visit: Payer: Self-pay

## 2023-12-10 NOTE — Telephone Encounter (Signed)
 Patient reports that she was already contacted about results and confirmed that she already has a lab appt scheduled. Wanted provider to know she had pancreatitis in the 1975s.   Copied from CRM #[id number]. Topic: Clinical - Lab/Test Results >> Dec 10, 2023 10:50 AM Alfonso HERO wrote: Reason for CRM: patient would like for someone from the office to call her and go over her lab results.

## 2023-12-10 NOTE — Telephone Encounter (Signed)
 Noted.  Jacklin Mascot, MD

## 2023-12-16 ENCOUNTER — Telehealth: Payer: Self-pay | Admitting: Nurse Practitioner

## 2023-12-16 NOTE — Telephone Encounter (Signed)
 Patient having trouble with constipation from taking the Hydrocodone . Wants to know if there is a different pain med that doesn't cause this problem

## 2023-12-16 NOTE — Telephone Encounter (Signed)
 Patient notified. Verbalized understanding. Will call if she has further concerns.

## 2023-12-17 ENCOUNTER — Other Ambulatory Visit (INDEPENDENT_AMBULATORY_CARE_PROVIDER_SITE_OTHER)

## 2023-12-17 DIAGNOSIS — R945 Abnormal results of liver function studies: Secondary | ICD-10-CM

## 2023-12-17 DIAGNOSIS — R1084 Generalized abdominal pain: Secondary | ICD-10-CM

## 2023-12-17 DIAGNOSIS — R748 Abnormal levels of other serum enzymes: Secondary | ICD-10-CM

## 2023-12-18 LAB — COMPLETE METABOLIC PANEL WITHOUT GFR
AG Ratio: 2 (calc) (ref 1.0–2.5)
ALT: 18 U/L (ref 6–29)
AST: 19 U/L (ref 10–35)
Albumin: 4.3 g/dL (ref 3.6–5.1)
Alkaline phosphatase (APISO): 73 U/L (ref 37–153)
BUN: 8 mg/dL (ref 7–25)
CO2: 32 mmol/L (ref 20–32)
Calcium: 9.2 mg/dL (ref 8.6–10.4)
Chloride: 104 mmol/L (ref 98–110)
Creat: 0.98 mg/dL (ref 0.60–1.00)
Globulin: 2.1 g/dL (ref 1.9–3.7)
Glucose, Bld: 96 mg/dL (ref 65–99)
Potassium: 4.5 mmol/L (ref 3.5–5.3)
Sodium: 143 mmol/L (ref 135–146)
Total Bilirubin: 0.9 mg/dL (ref 0.2–1.2)
Total Protein: 6.4 g/dL (ref 6.1–8.1)

## 2023-12-18 LAB — CBC WITH DIFFERENTIAL/PLATELET
Absolute Lymphocytes: 1609 {cells}/uL (ref 850–3900)
Absolute Monocytes: 529 {cells}/uL (ref 200–950)
Basophils Absolute: 38 {cells}/uL (ref 0–200)
Basophils Relative: 0.7 %
Eosinophils Absolute: 151 {cells}/uL (ref 15–500)
Eosinophils Relative: 2.8 %
HCT: 37.6 % (ref 35.0–45.0)
Hemoglobin: 12.5 g/dL (ref 11.7–15.5)
MCH: 29.4 pg (ref 27.0–33.0)
MCHC: 33.2 g/dL (ref 32.0–36.0)
MCV: 88.5 fL (ref 80.0–100.0)
MPV: 10.9 fL (ref 7.5–12.5)
Monocytes Relative: 9.8 %
Neutro Abs: 3073 {cells}/uL (ref 1500–7800)
Neutrophils Relative %: 56.9 %
Platelets: 224 Thousand/uL (ref 140–400)
RBC: 4.25 Million/uL (ref 3.80–5.10)
RDW: 12.2 % (ref 11.0–15.0)
Total Lymphocyte: 29.8 %
WBC: 5.4 Thousand/uL (ref 3.8–10.8)

## 2023-12-18 LAB — LIPASE: Lipase: 51 U/L (ref 7–60)

## 2023-12-19 ENCOUNTER — Ambulatory Visit: Payer: Self-pay

## 2023-12-29 ENCOUNTER — Ambulatory Visit: Payer: Self-pay

## 2023-12-29 NOTE — Telephone Encounter (Signed)
 Recommend office visit.  Amy Lagrange, MD\

## 2023-12-29 NOTE — Telephone Encounter (Signed)
 FYI Only or Action Required?: Action required by provider: clinical question for provider.  Patient was last seen in primary care on 12/09/2023 by Abbey Bruckner, MD.  Called Nurse Triage reporting Constipation.  Symptoms began a week ago.  Interventions attempted: OTC medications: Miralax, stool softener.Had very small bit of stool this morning.  Symptoms are: unchanged. Asking what else OTC she can try for constipation. Please advise pt.  Triage Disposition: See PCP When Office is Open (Within 3 Days)  Patient/caregiver understands and will follow disposition?: No, wishes to speak with PCP    Copied from CRM #[id number]. Topic: Clinical - Red Word Triage >> Dec 29, 2023 10:11 AM Frederich PARAS wrote: [facility] that prompted transfer to Nurse Triage: miserable due to constipation   2 back surgeries , take pain meds but its causing constipation, pt says she feels miserable because she cant use restrrom, she has tried stool softners not working,. She says she is in pain Reason for Disposition  Unable to have a bowel movement (BM) without laxative or enema  Answer Assessment - Initial Assessment Questions 1. STOOL PATTERN OR FREQUENCY: How often do you have a bowel movement (BM)?  (Normal range: 3 times a day to every 3 days)  When was your last BM?       Today, very small 2. STRAINING: Do you have to strain to have a BM?      yes 3. ONSET: When did the constipation begin?     This week 4. RECTAL PAIN: Does your rectum hurt when the stool comes out? If Yes, ask: Do you have hemorrhoids? How bad is the pain?  (Scale 1-10; or mild, moderate, severe)     no 5. BM COMPOSITION: Are the stools hard?      yes 6. BLOOD ON STOOLS: Has there been any blood on the toilet tissue or on the surface of the BM? If Yes, ask: When was the last time?     no 7. CHRONIC CONSTIPATION: Is this a new problem for you?  If No, ask: How long have you had this problem? (days, weeks, months)       no 8. CHANGES IN DIET OR HYDRATION: Have there been any recent changes in your diet? How much fluids are you drinking on a daily basis?  How much have you had to drink today?     no 9. MEDICINES: Have you been taking any new medicines? Are you taking any narcotic pain medicines? (e.g., Dilaudid , morphine , Percocet, Vicodin)     no 10. LAXATIVES: Have you been using any stool softeners, laxatives, or enemas?  If Yes, ask What are you using, how often, and when was the last time?       Miralax, stool softener 11. ACTIVITY:  How much walking do you do every day?  Has your activity level decreased in the past week?        no 12. CAUSE: What do you think is causing the constipation?        unsure 13. MEDICAL HISTORY: Do you have a history of hemorrhoids, rectal fissures, rectal surgery, or rectal abscess?         no 14. OTHER SYMPTOMS: Do you have any other symptoms? (e.g., abdomen pain, bloating, fever, vomiting)       no 15. PREGNANCY: Is there any chance you are pregnant? When was your last menstrual period?       no  Protocols used: Constipation-A-AH

## 2023-12-30 ENCOUNTER — Other Ambulatory Visit: Payer: Self-pay

## 2023-12-30 DIAGNOSIS — R0789 Other chest pain: Secondary | ICD-10-CM

## 2024-01-11 ENCOUNTER — Ambulatory Visit

## 2024-01-11 VITALS — BP 110/60 | HR 71 | Temp 98.5°F | Ht 64.0 in | Wt 110.4 lb

## 2024-01-11 DIAGNOSIS — Z136 Encounter for screening for cardiovascular disorders: Secondary | ICD-10-CM | POA: Diagnosis not present

## 2024-01-11 DIAGNOSIS — R7301 Impaired fasting glucose: Secondary | ICD-10-CM | POA: Insufficient documentation

## 2024-01-11 DIAGNOSIS — I10 Essential (primary) hypertension: Secondary | ICD-10-CM | POA: Diagnosis not present

## 2024-01-11 DIAGNOSIS — E673 Hypervitaminosis D: Secondary | ICD-10-CM

## 2024-01-11 DIAGNOSIS — R630 Anorexia: Secondary | ICD-10-CM | POA: Insufficient documentation

## 2024-01-11 DIAGNOSIS — M5416 Radiculopathy, lumbar region: Secondary | ICD-10-CM | POA: Diagnosis not present

## 2024-01-11 DIAGNOSIS — M8580 Other specified disorders of bone density and structure, unspecified site: Secondary | ICD-10-CM | POA: Insufficient documentation

## 2024-01-11 MED ORDER — VERAPAMIL HCL ER 240 MG PO TBCR: EXTENDED_RELEASE_TABLET | ORAL | 3 refills | Status: AC

## 2024-01-11 MED ORDER — GABAPENTIN 300 MG PO CAPS: ORAL_CAPSULE | ORAL | 0 refills | Status: AC

## 2024-01-11 NOTE — Patient Instructions (Addendum)
 For constipation Lifestyle measures:  Increase fluids, dietary fiber, and physical activity as tolerated. Recommend regular timing and prompt response to urge. Start Miralax at 17 g daily in a glass of water. You can get this over the counter.  You can also discuss this with your pain doctor during your next visit.   - You qualify for flu, COVID and tetanus immunization. You can update these through your local pharmacy.

## 2024-01-11 NOTE — Progress Notes (Signed)
 Established Patient Office Visit TOC    Subjective  Patient ID: Amy Grant, female    DOB: 05-Sep-1950  Age: 73 y.o. MRN: [id number]  Chief Complaint  Patient presents with   Establish Care   Constipation    Discussed the use of AI scribe software for clinical note transcription with the patient, who gave verbal consent to proceed.  History of Present Illness Amy Grant is a 73 year old female who presents for a transition of care to a new primary care provider.  She experiences constipation, which she attributes to her use of hydrocodone  for back pain/chronic pain. She takes hydrocodone  7.5 mg-325 mg once or twice daily as needed. Lately,  she has noted improvement in  constipation with mineral oil and hot tea.  She has a history of spinal surgery.  She reports that she used to experience more numbness and tingling in her arms and legs than she does now. She takes gabapentin  300 mg twice daily for  neuropathic pain. She also takes prn Tizanidine  2 mg, on average 2-3 times a week which helps with muscle spasm.  UDS: 08/26/23 appropriate.  Sees pain clinic for chronic pain syndrome.  Has seen Dr. Marcelino and Emmy Blanch, NP.  She takes Prilosec 40 mg daily for acid reflux. She previously experienced nausea, for which she was prescribed Zofran , but she no longer requires it.  She takes verapamil  daily for blood pressure management. She has not seen a cardiologist recently but was told her heart was healthy in the past.  She has a history of pancreatitis and mildly elevated lipase last month. Repeat lipase was normal. No abdominal pain now.  She reports that she does not consume alcohol or smoke.   She reports a low appetite and difficulty gaining weight. She does not consume large meals and finds that eating a lot, especially in the morning, makes her feel sick. She has consulted with nutritional specialists in the past but finds protein shakes unappealing.  She takes a daily  multivitamin and vitamin D . She had osteopenia on DEXA from 03/18/22.   She was found to have elevated serum vitamin D  level on 07/14/22.   Labs:  12/14/23 normal CMP, CBC  Mildly elevated lipase at 12/09/23 at 81, repeat lipase normal on 12/17/23  TSH normal on 12/09/23     ROS As per HPI    Objective:     BP 110/60 (BP Location: Right Arm, Patient Position: Sitting, Cuff Size: Normal)   Pulse 71   Temp 98.5 F (36.9 C) (Oral)   Ht 5' 4 (1.626 m)   Wt 110 lb 6.4 oz (50.1 kg)   SpO2 99%   BMI 18.95 kg/m      01/11/2024    3:03 PM 12/02/2023    3:01 PM 11/19/2023    2:33 PM  Depression screen PHQ 2/9  Decreased Interest 1 0 0  Down, Depressed, Hopeless 1 0 0  PHQ - 2 Score 2 0 0  Altered sleeping 2 3   Tired, decreased energy 2 2   Change in appetite 3 0   Feeling bad or failure about yourself  1 0   Trouble concentrating 1 0   Moving slowly or fidgety/restless 0 0   Suicidal thoughts 0 0   PHQ-9 Score 11 5    Difficult doing work/chores Not difficult at all Not difficult at all      Data saved with a previous flowsheet row definition  01/11/2024    3:04 PM 11/05/2023    4:04 PM 07/21/2023    2:43 PM 07/30/2022    3:50 PM  GAD 7 : Generalized Anxiety Score  Nervous, Anxious, on Edge 1 0 1 0  Control/stop worrying 1 0 1 2  Worry too much - different things 2 0 2 2  Trouble relaxing 2 1 2 1   Restless 1 1 0 0  Easily annoyed or irritable 1 1 1 1   Afraid - awful might happen 1 1 1 1   Total GAD 7 Score 9 4 8 7   Anxiety Difficulty Not difficult at all Somewhat difficult  Not difficult at all      01/11/2024    3:03 PM 12/02/2023    3:01 PM 11/19/2023    2:33 PM  Depression screen PHQ 2/9  Decreased Interest 1 0 0  Down, Depressed, Hopeless 1 0 0  PHQ - 2 Score 2 0 0  Altered sleeping 2 3   Tired, decreased energy 2 2   Change in appetite 3 0   Feeling bad or failure about yourself  1 0   Trouble concentrating 1 0   Moving slowly or fidgety/restless 0  0   Suicidal thoughts 0 0   PHQ-9 Score 11 5    Difficult doing work/chores Not difficult at all Not difficult at all      Data saved with a previous flowsheet row definition      01/11/2024    3:04 PM 11/05/2023    4:04 PM 07/21/2023    2:43 PM 07/30/2022    3:50 PM  GAD 7 : Generalized Anxiety Score  Nervous, Anxious, on Edge 1 0 1 0  Control/stop worrying 1 0 1 2  Worry too much - different things 2 0 2 2  Trouble relaxing 2 1 2 1   Restless 1 1 0 0  Easily annoyed or irritable 1 1 1 1   Afraid - awful might happen 1 1 1 1   Total GAD 7 Score 9 4 8 7   Anxiety Difficulty Not difficult at all Somewhat difficult  Not difficult at all   SDOH Screenings   Food Insecurity: No Food Insecurity (01/07/2024)  Housing: Low Risk  (01/07/2024)  Transportation Needs: No Transportation Needs (01/07/2024)  Utilities: Not At Risk (12/02/2023)  Alcohol Screen: Low Risk  (12/02/2023)  Depression (PHQ2-9): High Risk (01/11/2024)  Financial Resource Strain: Low Risk  (01/07/2024)  Physical Activity: Insufficiently Active (01/07/2024)  Social Connections: Moderately Integrated (01/07/2024)  Stress: Stress Concern Present (01/07/2024)  Tobacco Use: Medium Risk (01/11/2024)  Health Literacy: Adequate Health Literacy (12/02/2023)     Physical Exam Constitutional:      General: She is not in acute distress.    Appearance: Normal appearance.  HENT:     Head: Normocephalic and atraumatic.     Right Ear: Tympanic membrane normal.     Left Ear: Tympanic membrane normal.     Mouth/Throat:     Mouth: Mucous membranes are moist.  Neck:     Thyroid : No thyroid  mass or thyroid  tenderness.  Cardiovascular:     Rate and Rhythm: Normal rate and regular rhythm.  Pulmonary:     Effort: Pulmonary effort is normal.     Breath sounds: Normal breath sounds.  Abdominal:     General: Bowel sounds are normal.     Palpations: Abdomen is soft.     Tenderness: There is no guarding or rebound.  Musculoskeletal:      Cervical back:  Neck supple. No rigidity.     Right lower leg: No edema.     Left lower leg: No edema.  Lymphadenopathy:     Cervical: No cervical adenopathy.  Skin:    General: Skin is warm.  Neurological:     Mental Status: She is alert and oriented to person, place, and time.  Psychiatric:        Mood and Affect: Mood normal.        Behavior: Behavior normal.        No results found for any visits on 01/11/24.  The 10-year ASCVD risk score (Arnett DK, et al., 2019) is: 9.7%     Assessment & Plan:   Assessment & Plan Essential hypertension BP within goal with current medication, continue. Refill sent.  Orders:   verapamil  (CALAN -SR) 240 MG CR tablet; TAKE 1 TABLET(240 MG) BY MOUTH AT BEDTIME  Lumbar radiculopathy Chronic pain managed with hydrocodone  and gabapentin . Neuropathy symptoms well-managed with Gabapentin  300 mg, twice a day. PDMP reviewed, future refill sent (last refill on 01/08/24, recommend patient calls pharmacy as she gets closer to needing a refill). Follow up with pain management specialist as scheduled for chronic pain management. Also discussed with patient on potential medication induced/opoid induced constipation and following recommendations made:  Increase fluids, dietary fiber, and physical activity as tolerated. Recommend regular timing and prompt response to urge. Start Miralax at 17 g daily in a glass of water. You can get this over the counter.  You can also discuss this with your pain doctor during your next visit.     Encounter for screening for cardiovascular disorders Fasting lipid ordered, management pending result.  Orders:   Lipid panel; Future  Elevated fasting glucose Recommend checking A1c, lab ordered.  Orders:   HgB A1c; Future  Osteopenia, unspecified location Reviewed DEXA from 03/18/2022. On vitamin D  and calcium  supplement, continue.     High vitamin D  level Previous labs indicated high vitamin D  levels. Ordered repeat  vitamin D  level, lab ordered. Management pending results.  Orders:   Vitamin D  (25 hydroxy); Future  Appetite impaired Chronic impaired appetite with difficulty gaining weight. Intolerant to milky drinks. No significant lab findings including CBC, TSH, CMP. Encouraged high protein, high calorie diet. Offered referral to nutritional specialist if desired. Patient politely declined this during today's visit.  Provided educational materials on high protein, high calorie diet.  Continue to monitor.       Return for Fasting labs at patient's convinience follow up with Dr. Abbey in 4 months .   Luke Abbey, MD

## 2024-01-11 NOTE — Assessment & Plan Note (Signed)
 Previous labs indicated high vitamin D  levels. Ordered repeat vitamin D  level, lab ordered. Management pending results.  Orders:   Vitamin D  (25 hydroxy); Future

## 2024-01-11 NOTE — Assessment & Plan Note (Addendum)
 Chronic pain managed with hydrocodone  and gabapentin . Neuropathy symptoms well-managed with Gabapentin  300 mg, twice a day. PDMP reviewed, future refill sent (last refill on 01/08/24, recommend patient calls pharmacy as she gets closer to needing a refill). Follow up with pain management specialist as scheduled for chronic pain management. Also discussed with patient on potential medication induced/opoid induced constipation and following recommendations made:  Increase fluids, dietary fiber, and physical activity as tolerated. Recommend regular timing and prompt response to urge. Start Miralax at 17 g daily in a glass of water. You can get this over the counter.  You can also discuss this with your pain doctor during your next visit.

## 2024-01-11 NOTE — Assessment & Plan Note (Signed)
 Recommend checking A1c, lab ordered.  Orders:   HgB A1c; Future

## 2024-01-11 NOTE — Assessment & Plan Note (Signed)
 Fasting lipid ordered, management pending result.  Orders:   Lipid panel; Future

## 2024-01-11 NOTE — Assessment & Plan Note (Addendum)
 BP within goal with current medication, continue. Refill sent.  Orders:   verapamil  (CALAN -SR) 240 MG CR tablet; TAKE 1 TABLET(240 MG) BY MOUTH AT BEDTIME

## 2024-01-11 NOTE — Assessment & Plan Note (Signed)
 Chronic impaired appetite with difficulty gaining weight. Intolerant to milky drinks. No significant lab findings including CBC, TSH, CMP. Encouraged high protein, high calorie diet. Offered referral to nutritional specialist if desired. Patient politely declined this during today's visit.  Provided educational materials on high protein, high calorie diet.  Continue to monitor.

## 2024-01-11 NOTE — Assessment & Plan Note (Signed)
 Reviewed DEXA from 03/18/2022. On vitamin D  and calcium  supplement, continue.

## 2024-01-23 ENCOUNTER — Other Ambulatory Visit: Payer: Self-pay

## 2024-01-23 DIAGNOSIS — R0789 Other chest pain: Secondary | ICD-10-CM

## 2024-01-26 NOTE — Telephone Encounter (Signed)
 Last office visit 01/11/2024 for establish care.  Last refilled 12/31/2023 for #30 with no refills.  Next appt: 05/10/2024 for 4 month follow up.

## 2024-02-06 ENCOUNTER — Other Ambulatory Visit: Payer: Self-pay

## 2024-02-08 ENCOUNTER — Telehealth: Payer: Self-pay

## 2024-02-08 DIAGNOSIS — M961 Postlaminectomy syndrome, not elsewhere classified: Secondary | ICD-10-CM | POA: Insufficient documentation

## 2024-02-08 DIAGNOSIS — Z79899 Other long term (current) drug therapy: Secondary | ICD-10-CM | POA: Insufficient documentation

## 2024-02-08 NOTE — Telephone Encounter (Signed)
 Telephone call to patient at the request of Seema. After a reviewing UDS, she was positive for THC. Per Seema, no narcotic medication will be given. Patient confirmed that she was using the THC Vape pen. Patient verbalized understanding and has decided to cancel her appointment at this time. Appointment cancelled per patient request.

## 2024-02-08 NOTE — Telephone Encounter (Signed)
 Is the pharmacy requesting renewal of zofran ? This medication was no longer necessary during her last visit with me this discontinuation. If it is pharmacy requesting refill, renewal is not appropriate. If patient is the one requesting a refill I will send a refill.   Luke Shade, MD

## 2024-02-08 NOTE — Progress Notes (Deleted)
 PROVIDER NOTE: Interpretation of information contained herein should be left to medically-trained personnel. Specific patient instructions are provided elsewhere under Patient Instructions section of medical record. This document was created in part using AI and STT-dictation technology, any transcriptional errors that may result from this process are unintentional.  Patient: Amy Grant  Service: E/M   PCP: Abbey Bruckner, MD  DOB: Sep 19, 1950  DOS: 02/10/2024  Provider: Emmy MARLA Blanch, NP  MRN: [id number]  Delivery: Face-to-face  Specialty: Interventional Pain Management  Type: Established Patient  Setting: Ambulatory outpatient facility  Specialty designation: 09  Referring Prov.: No ref. provider found  Location: Outpatient office facility       History of present illness (HPI) [name], a 73 y.o. year old female, is here today because of her Chronic pain syndrome [G89.4]. Amy Grant primary complain today is No chief complaint on file.  Pertinent problems: Amy Grant has Essential hypertension; Radiculopathy; Numbness and tingling of both upper extremities; Spinal stenosis, lumbar region, with neurogenic claudication; Lumbar degenerative disc disease; Chronic pain syndrome; Pain management contract signed; Unexplained weight loss; and Costochondral chest pain on their pertinent problem list.  Pain Assessment: Severity of   is reported as a  /10. Location:    / . Onset:  . Quality:  . Timing:  . Modifying factor(s):  SABRA Vitals:  vitals were not taken for this visit.  BMI: Estimated body mass index is 18.95 kg/m as calculated from the following:   Height as of 01/11/24: 5' 4 (1.626 m).   Weight as of 01/11/24: 110 lb 6.4 oz (50.1 kg).  Last encounter: 11/19/2023. Last procedure: Visit date not found.  Reason for encounter: {Blank single:19197::medication management,post-procedure evaluation and assessment,both, medication management and post-procedure evaluation and  assessment,evaluation of worsening, or previously known (established) problem,patient-requested evaluation,follow-up evaluation,evaluation for possible interventional PM therapy/treatment,evaluation of Amy problem, *** }.   Discussed the use of AI scribe software for clinical note transcription with the patient, who gave verbal consent to proceed.  History of Present Illness           Pharmacotherapy Assessment   Analgesic: Hydrocodone -acetaminophen  (Norco) 7.5-325 mg tablet every 6 hours as needed for pain. MME=22.50 Monitoring: [place] PMP: PDMP reviewed during this encounter.       Pharmacotherapy: No side-effects or adverse reactions reported. Compliance: No problems identified. Effectiveness: Clinically acceptable.  Amy Grant, Amy Grant  11/19/2023  2:50 PM  Sign when Signing Visit Nursing Pain Medication Assessment:  Safety precautions to be maintained throughout the outpatient stay will include: orient to surroundings, keep bed in low position, maintain call bell within reach at all times, provide assistance with transfer out of bed and ambulation.  Medication Inspection Compliance: Pill count conducted under aseptic conditions, in front of the patient. Neither the pills nor the bottle was removed from the patient's sight at any time. Once count was completed pills were immediately returned to the patient in their original bottle.  Medication: Hydrocodone /APAP Pill/Patch Count: 30 of 120 pills/patches remain Pill/Patch Appearance: Markings consistent with prescribed medication Bottle Appearance: Standard pharmacy container. Clearly labeled. Filled Date: 06 / 25 / 2025 Last Medication intake:  Today  UDS:  Summary  Date Value Ref Range Status  08/26/2023 FINAL  Final    Comment:    ==================================================================== [employer] Select 13 (MW) ==================================================================== Test  Result       Flag       Units  Drug Present and Declared for Prescription Verification   Hydrocodone                     915          EXPECTED   ng/mg creat   Hydromorphone                   101          EXPECTED   ng/mg creat   Dihydrocodeine                 60           EXPECTED   ng/mg creat   Norhydrocodone                 637          EXPECTED   ng/mg creat    Sources of hydrocodone  include scheduled prescription medications.    Hydromorphone , dihydrocodeine and norhydrocodone are expected    metabolites of hydrocodone . Hydromorphone  and dihydrocodeine are    also available as scheduled prescription medications.  Drug Present not Declared for Prescription Verification   Carboxy-THC                    >1163        UNEXPECTED ng/mg creat    Carboxy-THC is a metabolite of tetrahydrocannabinol (THC). Source of    THC is most commonly herbal marijuana or marijuana-based products,    but THC is also present in a scheduled prescription medication.    Trace amounts of THC can be present in hemp and cannabidiol (CBD)    products. This test is not intended to distinguish between delta-9-    tetrahydrocannabinol, the predominant form of THC in most herbal or    marijuana-based products, and delta-8-tetrahydrocannabinol.  ==================================================================== Test                      Result    Flag   Units      Ref Range   Creatinine              86               mg/dL      >=79 ==================================================================== Declared Medications:  The flagging and interpretation on this report are based on the  following declared medications.  Unexpected results may arise from  inaccuracies in the declared medications.   **Note: The testing scope of this panel includes these medications:   Hydrocodone    **Note: The testing scope of this panel does not include the  following reported medications:   Acetaminophen   Albuterol   (Ventolin  HFA)  Calcium   Fluoride   Gabapentin   Magnesium  Multivitamin  Omeprazole   Potassium  Tizanidine   Verapamil   Vitamin D  ==================================================================== For clinical consultation, please call [phone]. ====================================================================     No results found for: CBDTHCR No results found for: D8THCCBX No results found for: D9THCCBX  ROS  Constitutional: Denies any fever or chills Gastrointestinal: No reported hemesis, hematochezia, vomiting, or acute GI distress Musculoskeletal: Denies any acute onset joint swelling, redness, loss of ROM, or weakness Neurological: No reported episodes of acute onset apraxia, aphasia, dysarthria, agnosia, amnesia, paralysis, loss of coordination, or loss of consciousness  Medication Review  Calcium -Magnesium-Vitamin D , HYDROcodone -acetaminophen , Sod Fluoride -Potassium Nitrate , albuterol , diclofenac  Sodium, gabapentin , multivitamin with minerals, omeprazole , tiZANidine , and verapamil   History  Review  Allergy: Amy Grant is allergic to latex, sulfa antibiotics, and linaclotide. Drug: Amy Grant  reports no history of drug use. Alcohol:  reports current alcohol use of about 7.0 standard drinks of alcohol per week. Tobacco:  reports that she quit smoking about 12 years ago. Her smoking use included cigarettes. She started smoking about 42 years ago. She has never used smokeless tobacco. Social: Amy Grant  reports that she quit smoking about 12 years ago. Her smoking use included cigarettes. She started smoking about 42 years ago. She has never used smokeless tobacco. She reports current alcohol use of about 7.0 standard drinks of alcohol per week. She reports that she does not use drugs. 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 Surgical: Ms. Mohs  has a past surgical history that includes Hernia repair; Cesarean section; Exploratory laparotomy; Tonsillectomy; Appendectomy; Colon resection; Colonoscopy; Anterior cervical decomp/discectomy fusion (N/A, 08/07/2016); Breast biopsy (Left); Abdominal hysterectomy (1979); acdf; Transforaminal lumbar interbody fusion (tlif) with pedicle screw fixation 1 level (Right, 03/24/2018); low back surgery; Colonoscopy with propofol  (N/A, 01/15/2023); Esophagogastroduodenoscopy (egd) with propofol  (N/A, 01/15/2023); biopsy (01/15/2023); polypectomy (01/15/2023); Cataract extraction w/PHACO (Right, 08/19/2023); and Cataract extraction w/PHACO (Left, 09/02/2023). Family: family history includes Arthritis in her maternal grandmother and sister; Breast cancer (age of onset: 49) in her sister; Cancer in her maternal grandfather, mother, and sister; Diabetes in her paternal grandmother and sister; Heart disease in her mother; Heart failure in her mother; Hyperlipidemia in her maternal grandmother; Hypertension in her mother; Learning disabilities in her maternal grandmother; Miscarriages / Stillbirths in her daughter, mother, sister, and sister; Multiple sclerosis in her father; Other in her sister.  Laboratory Chemistry Profile   Renal Lab Results  Component Value Date   BUN 10 03/19/2023    CREATININE 1.02 (H) 03/19/2023   BCR 10 (L) 03/19/2023   GFR 54.67 (L) 07/14/2022   GFRAA >60 03/18/2018   GFRNONAA >60 03/18/2018    Hepatic Lab Results  Component Value Date   AST 27 03/19/2023   ALT 16 03/19/2023   ALBUMIN 5.0 (H) 03/19/2023   ALKPHOS 70 03/19/2023   LIPASE 57 03/19/2023    Electrolytes Lab Results  Component Value Date   NA 142 03/19/2023   K 4.2 03/19/2023   CL 102 03/19/2023   CALCIUM  9.6 03/19/2023    Bone Lab Results  Component Value Date   VD25OH 119.53 (HH) 07/14/2022    Inflammation (CRP: Acute Phase) (ESR: Chronic Phase) No results found for: CRP, ESRSEDRATE, LATICACIDVEN       Note: Above Lab results reviewed.  Recent Imaging Review  MR LIVER W WO CONTRAST CLINICAL DATA:  Characterize liver lesion  EXAM: MRI ABDOMEN WITHOUT AND WITH CONTRAST  TECHNIQUE: Multiplanar multisequence MR imaging of the abdomen was performed both before and after the administration of intravenous contrast.  CONTRAST:  5 mL Eovist  gadolinium contrast IV  COMPARISON:  CT abdomen pelvis, 01/28/2023  FINDINGS: Lower chest: No acute abnormality.  Hepatobiliary: Small arterially hyperenhancing lesion of the anterior right lobe of the liver hepatic segment VIII, measuring 0.6 x 0.6 cm (series 6, image 19). No appreciable underlying signal or diffusion abnormality, and this lesion is isoenhancing on all remaining contrast phases, including 20 minute delayed hepatobiliary phase. No gallstones, gallbladder wall thickening, or biliary dilatation.  Pancreas: Unremarkable. No pancreatic ductal dilatation or surrounding inflammatory changes.  Spleen: Normal in size without significant abnormality.  Adrenals/Urinary Tract: Adrenal glands are unremarkable. Kidneys are normal, without renal calculi, solid lesion, or hydronephrosis.  Stomach/Bowel: Stomach is within normal limits. No evidence of bowel wall thickening, distention, or inflammatory  changes.  Vascular/Lymphatic: Aortic atherosclerosis. No enlarged abdominal lymph nodes.  Other: No abdominal wall hernia or abnormality. No ascites.  Musculoskeletal: No acute or significant osseous findings.  IMPRESSION: 1. Small arterially hyperenhancing lesion of the anterior right lobe of the liver hepatic segment VIII, measuring 0.6 x 0.6 cm. No appreciable underlying signal or diffusion abnormality, and this lesion is isoenhancing on all remaining contrast phases, including 20 minute delayed hepatobiliary phase. This is most consistent with a small benign focal nodular hyperplasia. In the absence of known malignancy or chronic high risk liver disease (cirrhosis, chronic viral hepatitis), no further follow-up or characterization is required.  2.  No acute findings in the abdomen.  Aortic Atherosclerosis (ICD10-I70.0).  Electronically Signed   By: Marolyn JONETTA Jaksch M.D.   On: 03/31/2023 16:35 Note: Reviewed        Physical Exam  Vitals: BP (!) 148/66 (BP Location: Right Arm, Patient Position: Sitting)   Pulse 63   Temp (!) 97.3 F (36.3 C) (Temporal)   Resp 18   Ht 5' 4 (1.626 m)   Wt 115 lb (52.2 kg)   SpO2 99%   BMI 19.74 kg/m  BMI: Estimated body mass index is 19.74 kg/m as calculated from the following:   Height as of this encounter: 5' 4 (1.626 m).   Weight as of this encounter: 115 lb (52.2 kg). Ideal: Ideal body weight: 54.7 kg (120 lb 9.5 oz) General appearance: Well nourished, well developed, and well hydrated. In no apparent acute distress Mental status: Alert, oriented x 3 (person, place, & time)       Respiratory: No evidence of acute respiratory distress Eyes: PERLA   Assessment   Diagnosis Status  1. Chronic pain syndrome   2. Chronic radicular lumbar pain   3. Spinal stenosis, lumbar region, with neurogenic claudication   4. Post laminectomy syndrome   5. Failed back surgical syndrome   6. Medication management     Controlled Controlled Controlled   Updated Problems: No problems updated.  Plan of Care  Problem-specific:  Assessment and Plan The patient continues on current medication regimen.  Prescribing drug monitoring (PDMP) reviewed; findings consistent with the use of prescribed medication and no evidence of narcotic misuse or abuse.  Urine drug screening (UDS) up-to-date.  No other Amy issues or concerns reported at this visit.  Schedule follow-up in 90 days for medication management.  Advice patient cardiac evaluation or ED if the symptoms worsen.    Amy Grant has a current medication list which includes the following long-term medication(s): albuterol , calcium -magnesium-vitamin d , gabapentin , omeprazole , and verapamil .  Pharmacotherapy (Medications Ordered): Meds ordered this encounter  Medications   HYDROcodone -acetaminophen  (NORCO) 7.5-325 MG tablet    Sig: Take 1 tablet by mouth every 6 (six) hours as needed for moderate pain (pain score 4-6). Must last 30 days  Dispense:  120 tablet    Refill:  0   HYDROcodone -acetaminophen  (NORCO) 7.5-325 MG tablet    Sig: Take 1 tablet by mouth every 6 (six) hours as needed for moderate pain (pain score 4-6). Must last 30 days    Dispense:  120 tablet    Refill:  0   HYDROcodone -acetaminophen  (NORCO) 7.5-325 MG tablet    Sig: Take 1 tablet by mouth every 6 (six) hours as needed for moderate pain (pain score 4-6). Must last 30 days    Dispense:  120 tablet    Refill:  0   Orders:  No orders of the defined types were placed in this encounter.   Monitoring: [place] PMP: PDMP reviewed during this encounter. {Blank single:19197::Unable to conduct review of the controlled substance reporting system due to technological failure.,     } Pharmacotherapy: {Blank single:19197::Opioid-induced constipation (OIC)(K59.03, T40.2X5A),No side-effects or adverse reactions reported.} Compliance: {Blank single:19197::Medication agreement violation  - unsanctioned acquisition/use of additional opioid-containing medication,No problems identified.} Effectiveness: {Blank single:19197::Clinically acceptable.}  No notes on file  UDS:  Summary  Date Value Ref Range Status  08/26/2023 FINAL  Final    Comment:    ==================================================================== [employer] Select 13 (MW) ==================================================================== Test                             Result       Flag       Units  Drug Present and Declared for Prescription Verification   Hydrocodone                     915          EXPECTED   ng/mg creat   Hydromorphone                   101          EXPECTED   ng/mg creat   Dihydrocodeine                 60           EXPECTED   ng/mg creat   Norhydrocodone                 637          EXPECTED   ng/mg creat    Sources of hydrocodone  include scheduled prescription medications.    Hydromorphone , dihydrocodeine and norhydrocodone are expected    metabolites of hydrocodone . Hydromorphone  and dihydrocodeine are    also available as scheduled prescription medications.  Drug Present not Declared for Prescription Verification   Carboxy-THC                    >1163        UNEXPECTED ng/mg creat    Carboxy-THC is a metabolite of tetrahydrocannabinol (THC). Source of    THC is most commonly herbal marijuana or marijuana-based products,    but THC is also present in a scheduled prescription medication.    Trace amounts of THC can be present in hemp and cannabidiol (CBD)    products. This test is not intended to distinguish between delta-9-    tetrahydrocannabinol, the predominant form of THC in most herbal or    marijuana-based products, and delta-8-tetrahydrocannabinol.  ==================================================================== Test                      Result    Flag   Units  Ref Range   Creatinine              86               mg/dL       >=79 ==================================================================== Declared Medications:  The flagging and interpretation on this report are based on the  following declared medications.  Unexpected results may arise from  inaccuracies in the declared medications.   **Note: The testing scope of this panel includes these medications:   Hydrocodone    **Note: The testing scope of this panel does not include the  following reported medications:   Acetaminophen   Albuterol  (Ventolin  HFA)  Calcium   Fluoride   Gabapentin   Magnesium  Multivitamin  Omeprazole   Potassium  Tizanidine   Verapamil   Vitamin D  ==================================================================== For clinical consultation, please call [phone]. ====================================================================     No results found for: CBDTHCR No results found for: D8THCCBX No results found for: D9THCCBX  ROS  Constitutional: {Blank single:19197::Denies any fever or chills} Gastrointestinal: {Blank single:19197::No reported hemesis, hematochezia, vomiting, or acute GI distress} Musculoskeletal: {Blank single:19197::Denies any acute onset joint swelling, redness, loss of ROM, or weakness} Neurological: {Blank single:19197::No reported episodes of acute onset apraxia, aphasia, dysarthria, agnosia, amnesia, paralysis, loss of coordination, or loss of consciousness}  Medication Review  Calcium -Magnesium-Vitamin D , HYDROcodone -acetaminophen , Sod Fluoride -Potassium Nitrate , albuterol , gabapentin , multivitamin with minerals, omeprazole , tiZANidine , and verapamil   History Review  Allergy: Ms. Yan is allergic to latex, sulfa antibiotics, and linaclotide. Drug: Amy Grant  reports no history of drug use. Alcohol:  reports that she does not currently use alcohol. Tobacco:  reports that she quit smoking about 12 years ago. Her smoking use included cigarettes. She started smoking about  42 years ago. She has never used smokeless tobacco. Social: Amy Grant  reports that she quit smoking about 12 years ago. Her smoking use included cigarettes. She started smoking about 42 years ago. She has never used smokeless tobacco. She reports that she does not currently use alcohol. She reports that she does not use drugs. 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 Surgical: Ms. Kincannon  has a past surgical history that includes Hernia repair; Cesarean section; Exploratory laparotomy; Tonsillectomy; Appendectomy; Colon resection; Colonoscopy; Anterior cervical decomp/discectomy fusion (N/A, 08/07/2016); Breast biopsy (Left); Abdominal hysterectomy (1979); acdf; Transforaminal lumbar interbody fusion (tlif) with pedicle screw fixation 1 level (Right, 03/24/2018); low back surgery; Colonoscopy with propofol  (N/A, 01/15/2023); Esophagogastroduodenoscopy (egd) with propofol  (N/A, 01/15/2023); biopsy (01/15/2023); polypectomy (01/15/2023); Cataract extraction w/PHACO (Right, 08/19/2023); Cataract extraction w/PHACO (Left, 09/02/2023); and Spine surgery. Family: family history includes Arthritis in her maternal  grandmother and sister; Breast cancer (age of onset: 59) in her sister; Cancer in her maternal grandfather, mother, and sister; Diabetes in her paternal grandmother and sister; Heart disease in her mother; Heart failure in her mother; Hyperlipidemia in her maternal grandmother; Hypertension in her mother; Learning disabilities in her maternal grandmother; Miscarriages / Stillbirths in her daughter, mother,  sister, and sister; Multiple sclerosis in her father; Other in her sister.  Laboratory Chemistry Profile   Renal Lab Results  Component Value Date   BUN 8 12/17/2023   CREATININE 0.98 12/17/2023   BCR SEE NOTE: 12/17/2023   GFR 49.52 (L) 12/09/2023   GFRAA >60 03/18/2018   GFRNONAA >60 03/18/2018    Hepatic Lab Results  Component Value Date   AST 19 12/17/2023   ALT 18 12/17/2023   ALBUMIN 4.5 12/09/2023   ALKPHOS 75 12/09/2023   LIPASE 51 12/17/2023    Electrolytes Lab Results  Component Value Date   NA 143 12/17/2023   K 4.5 12/17/2023   CL 104 12/17/2023   CALCIUM  9.2 12/17/2023    Bone Lab Results  Component Value Date   VD25OH 119.53 (HH) 07/14/2022    Inflammation (CRP: Acute Phase) (ESR: Chronic Phase) No results found for: CRP, ESRSEDRATE, LATICACIDVEN       Note: {Blank single:19197::Amy Grant indicates labs done and monitored by primary care practitioner using a non-[facility] EMR system,No results found under the [facility] electronic medical record,Patient noncompliant with Lab Work orders.,Results made available to patient.,Lab results reviewed and made available to patient.,Lab results reviewed and explained to patient in Layman's terms.,Above Lab results reviewed.}  Recent Imaging Review  DG Abd 1 View EXAM: 1 VIEW XRAY OF THE ABDOMEN 12/09/2023 08:46:29 AM  COMPARISON: CT abdomen and pelvis 01/28/2023.  CLINICAL HISTORY: 73 year old female. Generalized abdominal pain, overflow diarrhea, suspect opioid induced  constipation.  FINDINGS:  BOWEL: Nonobstructive bowel gas pattern. Substantially reduced large bowel retained stool compared to the CT scout view last year, average volume of retained stool now. Other abdominal and pelvic visceral contours appear negative.  SOFT TISSUES: No opaque urinary calculi.  BONES: No acute osseous abnormality. Chronic lower lumbar spine fusion sequelae. Negative visible lung bases.  IMPRESSION: 1. Dramatically decreased colonic stool burden compared to CT last year . 2. Nonobstructed bowel gas pattern.  Electronically signed by: Helayne Hurst MD 12/09/2023 09:20 AM EDT RP Workstation: [id number] Note: {Blank single:19197::No Amy results found.,No results found under the [facility] electronic medical record.,Imaging results reviewed and explained to patient in Layman's terms.,Results of ordered imaging test(s) reviewed and explained to patient in Layman's terms.,Imaging results reviewed.,Reviewed} {Blank single:19197::Results visible under [facility].,Results visible under [facility].,Results visible under [facility].,Results visible under [facility].,Results visible under Care Everywhere.,Results made available to patient.,Copy of results provided to patient.,Patient noncompliant with diagnostic imaging orders.,     }  Physical Exam  Vitals: There were no vitals taken for this visit. BMI: Estimated body mass index is 18.95 kg/m as calculated from the following:   Height as of 01/11/24: 5' 4 (1.626 m).   Weight as of 01/11/24: 110 lb 6.4 oz (50.1 kg). Ideal: Patient weight not recorded General appearance: {general exam:210120802::Well nourished, well developed, and well hydrated. In no apparent acute distress} Mental status: {Blank single:19197::Alert and oriented x 3. Exaggerated physical and/or psychosocial pain behavior perceived.,Alert, oriented x 3 (person, place, & time)} {Blank single:19197::Amy Grant speech pattern  and demeanor seems to suggest oversedation,     } Respiratory: {Blank single:19197::Oxygen-dependent COPD,No evidence of acute respiratory distress} Eyes: {Blank single:19197::Miotic (pupilary constriction) due to opiate use,Midriatic,Anisocoric,Evidence of ptosis,Pin-point pupils,PERLA}   Assessment   Diagnosis Status  1. Chronic pain syndrome   2. Chronic radicular lumbar pain   3. Spinal stenosis, lumbar region, with neurogenic claudication   4. Failed back surgical syndrome   5. Post laminectomy syndrome  6. Medication management   7. Pain management contract signed   8. Lumbar radiculopathy    {Blank single:19197::Absent,Deteriorating,Having a Flare-up,Improved,Improving,Not improving,Not responding,Persistent,Present,Recurring,Reoccurring,Resolved,Responding,Stable,Unchanged,improved,Worsened,Worsening,Controlled} {Blank single:19197::Absent,Deteriorating,Having a Flare-up,Improved,Improving,Not improving,Not responding,Persistent,Present,Recurring,Reoccurring,Resolved,Responding,Stable,Unchanged,improved,Worsened,Worsening,Controlled} {Blank single:19197::Absent,Deteriorating,Having a Flare-up,Improved,Improving,Not improving,Not responding,Persistent,Present,Recurring,Reoccurring,Resolved,Responding,Stable,Unchanged,improved,Worsened,Worsening,Controlled}   Updated Problems: Problem  Medication Management  Post Laminectomy Syndrome    Plan of Care  Problem-specific:  Assessment and Plan            Ms. MAAME DACK has a current medication list which includes the following long-term medication(s): albuterol , calcium -magnesium-vitamin d , gabapentin , omeprazole , and verapamil .  Pharmacotherapy (Medications Ordered): No orders of the defined types were placed in this encounter.  Orders:  No orders of the defined types were placed in this  encounter.    {There is no content from the last Plan section.}   No follow-ups on file.    Recent Visits Date Type Provider Dept  11/19/23 Office Visit Zayaan Kozak K, NP [facility]  Showing recent visits within past 90 days and meeting all other requirements Future Appointments Date Type Provider Dept  02/10/24 Appointment Nataliee Shurtz K, NP [facility]  Showing future appointments within next 90 days and meeting all other requirements  I discussed the assessment and treatment plan with the patient. The patient was provided an opportunity to ask questions and all were answered. The patient agreed with the plan and demonstrated an understanding of the instructions.  Patient advised to call back or seek an in-person evaluation if the symptoms or condition worsens.  Duration of encounter: *** minutes.  Total time on encounter, as per AMA guidelines included both the face-to-face and non-face-to-face time personally spent by the physician and/or other qualified health care professional(s) on the day of the encounter (includes time in activities that require the physician or other qualified health care professional and does not include time in activities normally performed by clinical staff). Physician's time may include the following activities when performed: Preparing to see the patient (e.g., pre-charting review of records, searching for previously ordered imaging, lab work, and nerve conduction tests) Review of prior analgesic pharmacotherapies. Reviewing PMP Interpreting ordered tests (e.g., lab work, imaging, nerve conduction tests) Performing post-procedure evaluations, including interpretation of diagnostic procedures Obtaining and/or reviewing separately obtained history Performing a medically appropriate examination and/or evaluation Counseling and educating the patient/family/caregiver Ordering medications, tests, or procedures Referring and communicating  with other health care professionals (when not separately reported) Documenting clinical information in the electronic or other health record Independently interpreting results (not separately reported) and communicating results to the patient/ family/caregiver Care coordination (not separately reported)  Note by: Tranell Wojtkiewicz K Casen Pryor, NP (TTS and AI technology used. I apologize for any typographical errors that were not detected and corrected.) Date: 02/10/2024; Time: 1:29 PM

## 2024-02-09 ENCOUNTER — Telehealth: Payer: Self-pay

## 2024-02-09 DIAGNOSIS — R110 Nausea: Secondary | ICD-10-CM

## 2024-02-09 MED ORDER — ONDANSETRON HCL 4 MG PO TABS: 4.0000 mg | ORAL_TABLET | Freq: Every day | ORAL | 0 refills | Status: DC | PRN

## 2024-02-09 NOTE — Telephone Encounter (Signed)
 1. Nausea (Primary) - ondansetron  (ZOFRAN ) 4 MG tablet; Take 1 tablet (4 mg total) by mouth daily as needed for nausea or vomiting.  Dispense: 30 tablet; Refill: 0   Luke Shade, MD

## 2024-02-09 NOTE — Telephone Encounter (Signed)
 Copied from CRM #[id number]. Topic: Clinical - Prescription Issue >> Feb 09, 2024  9:57 AM Rea BROCKS wrote: Reason for CRM: Patient is requesting refill for ondansetron  (ZOFRAN -ODT) 4 MG disintegrating tablet  [id number] [address] [place], [place] - [address] AT [facility] OF [address] [place], [place] [address] Phone: [phone]  Fax: [phone]    Patient did call pharmacy but they told her to contact the clinic. Dr. Abbey stated in a note that she would send in refill for patient on 12/08 if patient requested.

## 2024-02-10 ENCOUNTER — Encounter: Admitting: Nurse Practitioner

## 2024-03-19 ENCOUNTER — Other Ambulatory Visit: Payer: Self-pay

## 2024-03-19 DIAGNOSIS — R110 Nausea: Secondary | ICD-10-CM

## 2024-05-10 ENCOUNTER — Ambulatory Visit

## 2024-12-05 ENCOUNTER — Ambulatory Visit
